# Patient Record
Sex: Female | Born: 1950 | Race: White | Hispanic: No | Marital: Married | Smoking: Former smoker
Health system: Southern US, Community
[De-identification: ages and names within clinical notes are randomized; demographics above are authoritative.]

## PROBLEM LIST (undated history)

## (undated) DIAGNOSIS — M25572 Pain in left ankle and joints of left foot: Secondary | ICD-10-CM

## (undated) DIAGNOSIS — H539 Unspecified visual disturbance: Secondary | ICD-10-CM

## (undated) DIAGNOSIS — J189 Pneumonia, unspecified organism: Secondary | ICD-10-CM

## (undated) DIAGNOSIS — R0600 Dyspnea, unspecified: Secondary | ICD-10-CM

## (undated) DIAGNOSIS — D649 Anemia, unspecified: Secondary | ICD-10-CM

## (undated) DIAGNOSIS — J849 Interstitial pulmonary disease, unspecified: Secondary | ICD-10-CM

## (undated) DIAGNOSIS — E782 Mixed hyperlipidemia: Secondary | ICD-10-CM

## (undated) DIAGNOSIS — E559 Vitamin D deficiency, unspecified: Secondary | ICD-10-CM

## (undated) DIAGNOSIS — I10 Essential (primary) hypertension: Secondary | ICD-10-CM

## (undated) DIAGNOSIS — M25571 Pain in right ankle and joints of right foot: Secondary | ICD-10-CM

## (undated) DIAGNOSIS — M5030 Other cervical disc degeneration, unspecified cervical region: Secondary | ICD-10-CM

## (undated) DIAGNOSIS — I639 Cerebral infarction, unspecified: Secondary | ICD-10-CM

## (undated) DIAGNOSIS — F329 Major depressive disorder, single episode, unspecified: Secondary | ICD-10-CM

## (undated) DIAGNOSIS — M130 Polyarthritis, unspecified: Secondary | ICD-10-CM

## (undated) DIAGNOSIS — F32A Depression, unspecified: Secondary | ICD-10-CM

## (undated) HISTORY — DX: Pneumonia, unspecified organism: J18.9

## (undated) HISTORY — PX: CARPAL TUNNEL RELEASE: SHX101

## (undated) HISTORY — DX: Pain in left ankle and joints of left foot: M25.572

## (undated) HISTORY — DX: Unspecified visual disturbance: H53.9

## (undated) HISTORY — DX: Pain in left ankle and joints of left foot: M25.571

## (undated) HISTORY — DX: Other cervical disc degeneration, unspecified cervical region: M50.30

## (undated) HISTORY — PX: TAYLOR BUNIONECTOMY: SHX2485

## (undated) HISTORY — DX: Polyarthritis, unspecified: M13.0

## (undated) HISTORY — PX: OTHER SURGICAL HISTORY: SHX169

## (undated) HISTORY — DX: Essential (primary) hypertension: I10

## (undated) HISTORY — DX: Vitamin D deficiency, unspecified: E55.9

## (undated) HISTORY — DX: Mixed hyperlipidemia: E78.2

## (undated) HISTORY — DX: Major depressive disorder, single episode, unspecified: F32.9

## (undated) HISTORY — PX: COLONOSCOPY: SHX174

---

## 2004-07-21 ENCOUNTER — Encounter: Admission: RE | Admit: 2004-07-21 | Discharge: 2004-07-21 | Payer: Self-pay | Admitting: Family Medicine

## 2005-08-26 ENCOUNTER — Encounter: Admission: RE | Admit: 2005-08-26 | Discharge: 2005-08-26 | Payer: Self-pay | Admitting: Family Medicine

## 2007-07-23 ENCOUNTER — Emergency Department (HOSPITAL_COMMUNITY): Admission: EM | Admit: 2007-07-23 | Discharge: 2007-07-23 | Payer: Self-pay | Admitting: Emergency Medicine

## 2008-04-11 ENCOUNTER — Encounter: Admission: RE | Admit: 2008-04-11 | Discharge: 2008-04-11 | Payer: Self-pay | Admitting: Family Medicine

## 2009-02-27 ENCOUNTER — Emergency Department (HOSPITAL_COMMUNITY): Admission: EM | Admit: 2009-02-27 | Discharge: 2009-02-27 | Payer: Self-pay | Admitting: Emergency Medicine

## 2009-07-30 ENCOUNTER — Encounter: Admission: RE | Admit: 2009-07-30 | Discharge: 2009-07-30 | Payer: Self-pay | Admitting: Family Medicine

## 2011-01-04 LAB — HEMOCCULT GUIAC POC 1CARD (OFFICE): Fecal Occult Bld: POSITIVE

## 2011-07-07 LAB — BASIC METABOLIC PANEL
BUN: 13
CO2: 21
Calcium: 9.8
Chloride: 104
Creatinine, Ser: 0.86
GFR calc Af Amer: >60
GFR calc non Af Amer: >60
Glucose, Bld: 126 — ABNORMAL HIGH
Potassium: 3.6
Sodium: 136

## 2011-07-07 LAB — CBC
HCT: 43.1
Hemoglobin: 14.7
MCHC: 34.2
MCV: 81.6
Platelets: 382
RBC: 5.28 — ABNORMAL HIGH
RDW: 14.2 — ABNORMAL HIGH
WBC: 10.5

## 2011-07-07 LAB — DIFFERENTIAL
Basophils Absolute: 0
Basophils Relative: 0
Eosinophils Absolute: 0.1
Eosinophils Relative: 1
Lymphocytes Relative: 21
Lymphs Abs: 2.2
Monocytes Absolute: 0.8 — ABNORMAL HIGH
Monocytes Relative: 8
Neutro Abs: 7.3
Neutrophils Relative %: 69

## 2014-01-15 ENCOUNTER — Other Ambulatory Visit: Payer: Self-pay | Admitting: Nurse Practitioner

## 2014-01-15 DIAGNOSIS — Z1231 Encounter for screening mammogram for malignant neoplasm of breast: Secondary | ICD-10-CM

## 2014-01-16 ENCOUNTER — Other Ambulatory Visit: Payer: Self-pay | Admitting: Nurse Practitioner

## 2014-01-16 DIAGNOSIS — Z780 Asymptomatic menopausal state: Secondary | ICD-10-CM

## 2014-02-13 ENCOUNTER — Encounter (INDEPENDENT_AMBULATORY_CARE_PROVIDER_SITE_OTHER): Payer: Self-pay

## 2014-02-13 ENCOUNTER — Ambulatory Visit
Admission: RE | Admit: 2014-02-13 | Discharge: 2014-02-13 | Disposition: A | Discharge status: Home / Self Care | Payer: BC Managed Care – PPO | Source: Ambulatory Visit | Attending: Nurse Practitioner | Admitting: Nurse Practitioner

## 2014-02-13 DIAGNOSIS — Z780 Asymptomatic menopausal state: Secondary | ICD-10-CM

## 2014-02-13 DIAGNOSIS — Z1231 Encounter for screening mammogram for malignant neoplasm of breast: Secondary | ICD-10-CM

## 2014-03-15 ENCOUNTER — Emergency Department (HOSPITAL_COMMUNITY)
Admission: EM | Admit: 2014-03-15 | Discharge: 2014-03-15 | Disposition: A | Discharge status: Home / Self Care | Payer: BC Managed Care – PPO | Attending: Emergency Medicine | Admitting: Emergency Medicine

## 2014-03-15 ENCOUNTER — Emergency Department (HOSPITAL_COMMUNITY): Payer: BC Managed Care – PPO

## 2014-03-15 ENCOUNTER — Encounter (HOSPITAL_COMMUNITY): Payer: Self-pay | Admitting: Emergency Medicine

## 2014-03-15 DIAGNOSIS — J3489 Other specified disorders of nose and nasal sinuses: Secondary | ICD-10-CM | POA: Insufficient documentation

## 2014-03-15 DIAGNOSIS — J40 Bronchitis, not specified as acute or chronic: Secondary | ICD-10-CM

## 2014-03-15 DIAGNOSIS — Z87891 Personal history of nicotine dependence: Secondary | ICD-10-CM | POA: Insufficient documentation

## 2014-03-15 DIAGNOSIS — Z8659 Personal history of other mental and behavioral disorders: Secondary | ICD-10-CM | POA: Insufficient documentation

## 2014-03-15 DIAGNOSIS — R0602 Shortness of breath: Secondary | ICD-10-CM

## 2014-03-15 HISTORY — DX: Major depressive disorder, single episode, unspecified: F32.9

## 2014-03-15 HISTORY — DX: Depression, unspecified: F32.A

## 2014-03-15 LAB — CBC WITH DIFFERENTIAL/PLATELET
Basophils Absolute: 0 10*3/uL (ref 0.0–0.1)
Basophils Relative: 0 % (ref 0–1)
Eosinophils Absolute: 0.1 10*3/uL (ref 0.0–0.7)
Eosinophils Relative: 1 % (ref 0–5)
HCT: 41 % (ref 36.0–46.0)
Hemoglobin: 14.2 g/dL (ref 12.0–15.0)
Lymphocytes Relative: 21 % (ref 12–46)
Lymphs Abs: 2.3 10*3/uL (ref 0.7–4.0)
MCH: 27.3 pg (ref 26.0–34.0)
MCHC: 34.6 g/dL (ref 30.0–36.0)
MCV: 78.7 fL (ref 78.0–100.0)
Monocytes Absolute: 0.6 10*3/uL (ref 0.1–1.0)
Monocytes Relative: 6 % (ref 3–12)
Neutro Abs: 8.1 10*3/uL — ABNORMAL HIGH (ref 1.7–7.7)
Neutrophils Relative %: 72 % (ref 43–77)
Platelets: 425 10*3/uL — ABNORMAL HIGH (ref 150–400)
RBC: 5.21 MIL/uL — ABNORMAL HIGH (ref 3.87–5.11)
RDW: 14.6 % (ref 11.5–15.5)
WBC: 11.2 10*3/uL — ABNORMAL HIGH (ref 4.0–10.5)

## 2014-03-15 LAB — COMPREHENSIVE METABOLIC PANEL
ALT: 23 U/L (ref 0–35)
AST: 36 U/L (ref 0–37)
Albumin: 4.1 g/dL (ref 3.5–5.2)
Alkaline Phosphatase: 95 U/L (ref 39–117)
BUN: 14 mg/dL (ref 6–23)
CO2: 16 mEq/L — ABNORMAL LOW (ref 19–32)
Calcium: 10 mg/dL (ref 8.4–10.5)
Chloride: 94 mEq/L — ABNORMAL LOW (ref 96–112)
Creatinine, Ser: 0.85 mg/dL (ref 0.50–1.10)
GFR calc Af Amer: 83 mL/min — ABNORMAL LOW (ref >90)
GFR calc non Af Amer: 72 mL/min — ABNORMAL LOW (ref >90)
Glucose, Bld: 139 mg/dL — ABNORMAL HIGH (ref 70–99)
Potassium: 3.8 mEq/L (ref 3.7–5.3)
Sodium: 134 mEq/L — ABNORMAL LOW (ref 137–147)
Total Bilirubin: 0.4 mg/dL (ref 0.3–1.2)
Total Protein: 8 g/dL (ref 6.0–8.3)

## 2014-03-15 LAB — PRO B NATRIURETIC PEPTIDE: Pro B Natriuretic peptide (BNP): 124.4 pg/mL (ref 0–125)

## 2014-03-15 LAB — I-STAT TROPONIN, ED: Troponin i, poc: 0 ng/mL (ref 0.00–0.08)

## 2014-03-15 MED ORDER — PREDNISONE 20 MG PO TABS
60.0000 mg | ORAL_TABLET | Freq: Once | ORAL | Status: AC
  Administered 2014-03-15: 60 mg via ORAL
  Filled 2014-03-15: qty 3

## 2014-03-15 MED ORDER — IPRATROPIUM BROMIDE 0.02 % IN SOLN
0.5000 mg | Freq: Once | RESPIRATORY_TRACT | Status: AC
  Administered 2014-03-15: 0.5 mg via RESPIRATORY_TRACT
  Filled 2014-03-15: qty 2.5

## 2014-03-15 MED ORDER — ALBUTEROL SULFATE (2.5 MG/3ML) 0.083% IN NEBU
5.0000 mg | INHALATION_SOLUTION | Freq: Once | RESPIRATORY_TRACT | Status: AC
  Administered 2014-03-15: 5 mg via RESPIRATORY_TRACT
  Filled 2014-03-15: qty 6

## 2014-03-15 MED ORDER — PROMETHAZINE-CODEINE 6.25-10 MG/5ML PO SYRP
5.0000 mL | ORAL_SOLUTION | Freq: Four times a day (QID) | ORAL | Status: DC | PRN
  Administered 2014-03-15: 5 mL via ORAL

## 2014-03-15 NOTE — ED Notes (Signed)
Pt's O2 94% while ambulating

## 2014-03-15 NOTE — Discharge Instructions (Signed)
Bronchitis Bronchitis is inflammation of the airways that extend from the windpipe into the lungs (bronchi). The inflammation often causes mucus to develop, which leads to a cough. If the inflammation becomes severe, it may cause shortness of breath. CAUSES  Bronchitis may be caused by:   Viral infections.   Bacteria.   Cigarette smoke.   Allergens, pollutants, and other irritants.  SIGNS AND SYMPTOMS  The most common symptom of bronchitis is a frequent cough that produces mucus. Other symptoms include:  Fever.   Body aches.   Chest congestion.   Chills.   Shortness of breath.   Sore throat.  DIAGNOSIS  Bronchitis is usually diagnosed through a medical history and physical exam. Tests, such as chest X-rays, are sometimes done to rule out other conditions.  TREATMENT  You may need to avoid contact with whatever caused the problem (smoking, for example). Medicines are sometimes needed. These may include:  Antibiotics. These may be prescribed if the condition is caused by bacteria.  Cough suppressants. These may be prescribed for relief of cough symptoms.   Inhaled medicines. These may be prescribed to help open your airways and make it easier for you to breathe.   Steroid medicines. These may be prescribed for those with recurrent (chronic) bronchitis. HOME CARE INSTRUCTIONS  Get plenty of rest.   Drink enough fluids to keep your urine clear or pale yellow (unless you have a medical condition that requires fluid restriction). Increasing fluids may help thin your secretions and will prevent dehydration.   Only take over-the-counter or prescription medicines as directed by your health care provider.  Only take antibiotics as directed. Make sure you finish them even if you start to feel better.  Avoid secondhand smoke, irritating chemicals, and strong fumes. These will make bronchitis worse. If you are a smoker, quit smoking. Consider using nicotine gum or  skin patches to help control withdrawal symptoms. Quitting smoking will help your lungs heal faster.   Put a cool-mist humidifier in your bedroom at night to moisten the air. This may help loosen mucus. Change the water in the humidifier daily. You can also run the hot water in your shower and sit in the bathroom with the door closed for 5-10 minutes.   Follow up with your health care provider as directed.   Wash your hands frequently to avoid catching bronchitis again or spreading an infection to others.  SEEK MEDICAL CARE IF: Your symptoms do not improve after 1 week of treatment.  SEEK IMMEDIATE MEDICAL CARE IF:  Your fever increases.  You have chills.   You have chest pain.   You have worsening shortness of breath.   You have bloody sputum.  You faint.  You have lightheadedness.  You have a severe headache.   You vomit repeatedly. MAKE SURE YOU:   Understand these instructions.  Will watch your condition.  Will get help right away if you are not doing well or get worse. Document Released: 09/13/2005 Document Revised: 07/04/2013 Document Reviewed: 05/08/2013 [facility] Patient Information 2015 [place], [place]. This information is not intended to replace advice given to you by your health care provider. Make sure you discuss any questions you have with your health care provider.  Shortness of Breath Shortness of breath means you have trouble breathing. Shortness of breath needs medical care right away. HOME CARE   Do not smoke.  Avoid being around chemicals or things (paint fumes, dust) that may bother your breathing.  Rest as needed. Slowly begin your  normal activities.  Only take medicines as told by your doctor.  Keep all doctor visits as told. GET HELP RIGHT AWAY IF:   Your shortness of breath gets worse.  You feel lightheaded, pass out (faint), or have a cough that is not helped by medicine.  You cough up blood.  You have pain with  breathing.  You have pain in your chest, arms, shoulders, or belly (abdomen).  You have a fever.  You cannot walk up stairs or exercise the way you normally do.  You do not get better in the time expected.  You have a hard time doing normal activities even with rest.  You have problems with your medicines.  You have any new symptoms. MAKE SURE YOU:  Understand these instructions.  Will watch your condition.  Will get help right away if you are not doing well or get worse. Document Released: 03/01/2008 Document Revised: 09/18/2013 Document Reviewed: 11/29/2011 [facility] Patient Information 2015 [place], [place]. This information is not intended to replace advice given to you by your health care provider. Make sure you discuss any questions you have with your health care provider.

## 2014-03-15 NOTE — ED Notes (Signed)
Pt was told by PCP yesterday to come to ED if she couldn't breath. Pt c/o SOB since this morning, PCP diagnosed pt with bronchitis, states she may have mentioned pneum ina. Pt oxygen saturation at 100%

## 2014-03-15 NOTE — ED Provider Notes (Signed)
CSN: [id number]     Arrival date & time 03/15/14  6415 History   First MD Initiated Contact with Patient 03/15/14 [phone]     Chief Complaint  Patient presents with  . Shortness of Breath     (Consider location/radiation/quality/duration/timing/severity/associated sxs/prior Treatment) HPI Comments: Patient is a 63 year old female with history of depression who presents today with shortness of breath. She reports that since Saturday she has been having gradually worsening shortness of breath and cough. She had congestion and rhinorrhea. She was seen by her primary care physician yesterday and given Cipro, prednisone, albuterol. She received a breathing treatment in the office yesterday which she reports improved her symptoms. She has been unable to tolerate her medications due to vomiting. She reports that she had bilateral leg pain last night. It was achy in nature. No unilateral leg swelling. No long trips or recent surgeries. No prior history of DVT or PE. Initially the patient denies smoking, but her son reports that she does in fact smoked marijuana through a pipe daily.  The history is provided by the patient. No language interpreter was used.    Past Medical History  Diagnosis Date  . Depression    Past Surgical History  Procedure Laterality Date  . Cesarean section     No family history on file. History  Substance Use Topics  . Smoking status: Former Research scientist (life sciences)  . Smokeless tobacco: Not on file  . Alcohol Use: Yes   OB History   Grav Para Term Preterm Abortions TAB SAB Ect Mult Living                 Review of Systems  Constitutional: Negative for fever, chills and diaphoresis.  HENT: Positive for congestion and rhinorrhea.   Respiratory: Positive for cough and shortness of breath.   Cardiovascular: Negative for chest pain, palpitations and leg swelling.  Gastrointestinal: Positive for nausea and vomiting. Negative for abdominal pain.  All other systems reviewed and are  negative.     Allergies  Review of patient's allergies indicates no known allergies.  Home Medications   Prior to Admission medications   Not on File   BP 153/83  Pulse 86  Temp(Src) 98.3 F (36.8 C) (Oral)  Resp 15  SpO2 94% Physical Exam  Nursing note and vitals reviewed. Constitutional: She is oriented to person, place, and time. She appears well-developed and well-nourished. No distress.  HENT:  Head: Normocephalic and atraumatic.  Right Ear: External ear normal.  Left Ear: External ear normal.  Nose: Nose normal.  Mouth/Throat: Oropharynx is clear and moist.  Eyes: Conjunctivae are normal.  Neck: Normal range of motion.  No nuchal rigidity or meningeal signs  Cardiovascular: Regular rhythm, normal heart sounds, intact distal pulses and normal pulses.  Tachycardia present.   Pulses:      Radial pulses are 2+ on the right side, and 2+ on the left side.       Posterior tibial pulses are 2+ on the right side, and 2+ on the left side.  No leg swelling or tenderness  Pulmonary/Chest: No stridor. Tachypnea noted. No respiratory distress. She has wheezes. She has rales.  Abdominal: Soft. She exhibits no distension. There is no tenderness.  Musculoskeletal: Normal range of motion.  Neurological: She is alert and oriented to person, place, and time. She has normal strength.  Skin: Skin is warm and dry. She is not diaphoretic. No erythema.  Psychiatric: She has a normal mood and affect. Her behavior is normal.  ED Course  Procedures (including critical care time) Labs Review Labs Reviewed  CBC WITH DIFFERENTIAL - Abnormal; Notable for the following:    WBC 11.2 (*)    RBC 5.21 (*)    Platelets 425 (*)    Neutro Abs 8.1 (*)    All other components within normal limits  COMPREHENSIVE METABOLIC PANEL - Abnormal; Notable for the following:    Sodium 134 (*)    Chloride 94 (*)    CO2 16 (*)    Glucose, Bld 139 (*)    GFR calc non Af Amer 72 (*)    GFR calc Af Amer  83 (*)    All other components within normal limits  PRO B NATRIURETIC PEPTIDE  I-STAT TROPOININ, ED    Imaging Review Dg Chest 2 View  03/15/2014   CLINICAL DATA:  Shortness of breath.  Former tobacco use.  EXAM: CHEST  2 VIEW  COMPARISON:  None.  FINDINGS: Airway thickening is present, suggesting bronchitis or reactive airways disease. Mild interstitial accentuation favoring the upper lungs. Atherosclerotic aortic arch. No cardiomegaly. No pleural effusion.  IMPRESSION: 1. Airway thickening is present, suggesting bronchitis or reactive airways disease. 2. Subtle upper lung zone vascularity scratch add subtle interstitial accentuation in the lungs, favoring the upper lobes. Walk quite likely related to the patient's prior smoking, various connective tissue disorders, pneumoconioses, and infectious processes can cause a similar appearance. 3. Atherosclerosis.   Electronically Signed   By: Sherryl Barters M.D.   On: 03/15/2014 11:11     EKG Interpretation None      MDM   Final diagnoses:  Bronchitis  Shortness of breath   Patient presents to ED with shortness of breath which has been worsening since Saturday. Sx began with rhinorrhea and congestion. She was seen by PCP yesterday and given abx, prednisone, and albuterol. She reports being unable to tolerate abx and steroids because of vomiting. She received phenergan in ED and has been able to tolerate PO here. Will discharge home with antiemetic. Patient feels improved after albuterol treatment here. Lung exam improved. Patient ambulated in ED with oxygen saturation > 94% on room air. Discussed strict reasons to return to ED immediately. Vital signs stable for discharge. Discussed case with Dr. Wilson Singer who agrees with plan. Patient / Family / Caregiver informed of clinical course, understand medical decision-making process, and agree with plan.     Elwyn Lade, PA-C 03/15/14 1310

## 2014-03-21 NOTE — ED Provider Notes (Signed)
Medical screening examination/treatment/procedure(s) were conducted as a shared visit with non-physician practitioner(s) and myself.  I personally evaluated the patient during the encounter.   EKG Interpretation   Date/Time:  Friday March 15 2014 09:53:32 EDT Ventricular Rate:  103 PR Interval:  110 QRS Duration: 84 QT Interval:  330 QTC Calculation: 432 R Axis:   74 Text Interpretation:  Sinus tachycardia Borderline repolarization  abnormality ED PHYSICIAN INTERPRETATION AVAILABLE IN [facility]  Confirmed by TEST, Record ([id number]) on 03/17/2014 10:17:63 AM     63 year old female with shortness of breath. Gradual onset of symptoms on Saturday and progressively worsening. She feels congested. Seen by PCP yesterday started on steroids, ciprofloxacin and albuterol. She is presenting today she's been having vomiting and keep her medications down. Clinically suspect the patient has bronchitis. Associated nausea and vomiting typically more associated with bacterial pneumonia though. Regardless, I feel she is stable for discharge. Continue steroids and antibiotics. Symptoms improved with albuterol here in the emergency room. She ambulated in ED and oxygen saturations remained good. We'll additionally start her on antiemetic. Return precautions discussed.   Virgel Manifold, MD 03/21/14 1041

## 2014-09-27 DIAGNOSIS — J189 Pneumonia, unspecified organism: Secondary | ICD-10-CM

## 2014-09-27 HISTORY — DX: Pneumonia, unspecified organism: J18.9

## 2016-01-08 ENCOUNTER — Other Ambulatory Visit: Payer: Self-pay | Admitting: Nurse Practitioner

## 2016-01-08 DIAGNOSIS — Z1231 Encounter for screening mammogram for malignant neoplasm of breast: Secondary | ICD-10-CM

## 2016-01-08 DIAGNOSIS — E2839 Other primary ovarian failure: Secondary | ICD-10-CM

## 2016-02-02 ENCOUNTER — Other Ambulatory Visit: Payer: Self-pay

## 2016-02-03 ENCOUNTER — Ambulatory Visit (INDEPENDENT_AMBULATORY_CARE_PROVIDER_SITE_OTHER): Payer: BLUE CROSS/BLUE SHIELD | Admitting: Internal Medicine

## 2016-02-03 ENCOUNTER — Other Ambulatory Visit (INDEPENDENT_AMBULATORY_CARE_PROVIDER_SITE_OTHER): Payer: BLUE CROSS/BLUE SHIELD

## 2016-02-03 ENCOUNTER — Encounter: Payer: Self-pay | Admitting: Internal Medicine

## 2016-02-03 VITALS — BP 154/82 | HR 66 | Ht 62.0 in | Wt 158.6 lb

## 2016-02-03 DIAGNOSIS — J45991 Cough variant asthma: Secondary | ICD-10-CM | POA: Diagnosis not present

## 2016-02-03 DIAGNOSIS — R918 Other nonspecific abnormal finding of lung field: Secondary | ICD-10-CM | POA: Diagnosis not present

## 2016-02-03 LAB — CBC WITH DIFFERENTIAL/PLATELET
BASOS ABS: 0 10*3/uL (ref 0.0–0.1)
Basophils Relative: 0.2 % (ref 0.0–3.0)
EOS PCT: 2.1 % (ref 0.0–5.0)
Eosinophils Absolute: 0.2 10*3/uL (ref 0.0–0.7)
HCT: 41.5 % (ref 36.0–46.0)
Hemoglobin: 13.7 g/dL (ref 12.0–15.0)
Lymphocytes Relative: 29.4 % (ref 12.0–46.0)
Lymphs Abs: 3.4 10*3/uL (ref 0.7–4.0)
MCHC: 33 g/dL (ref 30.0–36.0)
MCV: 81.7 fl (ref 78.0–100.0)
MONOS PCT: 5.2 % (ref 3.0–12.0)
Monocytes Absolute: 0.6 10*3/uL (ref 0.1–1.0)
Neutro Abs: 7.3 10*3/uL (ref 1.4–7.7)
Neutrophils Relative %: 63.1 % (ref 43.0–77.0)
Platelets: 398 10*3/uL (ref 150.0–400.0)
RBC: 5.08 Mil/uL (ref 3.87–5.11)
RDW: 15.4 % (ref 11.5–15.5)
WBC: 11.6 10*3/uL — ABNORMAL HIGH (ref 4.0–10.5)

## 2016-02-03 LAB — SEDIMENTATION RATE: Sed Rate: 18 mm/hr (ref 0–22)

## 2016-02-03 MED ORDER — BUDESONIDE-FORMOTEROL FUMARATE 80-4.5 MCG/ACT IN AERO: INHALATION_SPRAY | RESPIRATORY_TRACT | Status: DC

## 2016-02-03 NOTE — Patient Instructions (Addendum)
Plan A = Automatic =  symbicort 80 Take 2 puffs first thing in am and then another 2 puffs about 12 hours later.   Work on inhaler technique:  relax and gently blow all the way out then take a nice smooth deep breath back in, triggering the inhaler at same time you start breathing in.  Hold for up to 5 seconds if you can. Blow out thru nose. Rinse and gargle with water when done       Plan B = Backup Only use your albuterol (ventolin) as a rescue medication to be used if you can't catch your breath by resting or doing a relaxed purse lip breathing pattern.  - The less you use it, the better it will work when you need it. - Ok to use the inhaler up to 2 puffs  every 4 hours if you must but call for appointment if use goes up over your usual need - Don't leave home without it !!  (think of it like the spare tire for your car)   Please remember to go to the lab department downstairs for your tests - we will call you with the results when they are available.  Need the teeth taken care of and if not better next visit we will do a sinus CT   Please schedule a follow up office visit in 6 weeks, call sooner if needed with cxr on return

## 2016-02-03 NOTE — Progress Notes (Addendum)
Subjective:     Patient ID: Sharon [name], female   DOB: Aug 28, 1951,   MRN: [id number]  HPI  89 yowf quit smoking in 1980 with first pregnancy and no resp problems except for seasonal rhinitis starting 2012 but hen  around 2015 with recurrent cough > sob 3-4 x times per year rx by Dr Fayrene Fearing PA Nonda Lou with neb/ abx/ prednisone and prn symbicort 160 (was instructed to use as maint per office notes but not doing so)  referred to pulmonary clinic 02/03/2016 by Dr Melford Aase for cough assoc with finding of MPN's on ct chest 01/13/16     02/03/2016 1st [facility] office visit/ Leelynd Maldonado   Chief Complaint  Patient presents with  . Pulmonary Consult    Ref. by Dr. Donaciano Eva. CT,recurrent bronchitis.Occass. cough in am dry,no sob,wheezing occass.Denies cp or tightness.  still coughing in am "it's the pollen" x 5 years not previously allergy tested but does seem worse in spring but does have symptoms of sense of pnds/cough beginning to blend into a year round pattern x last sev years.  Also worse in am's but never wakes her up/ does not always correlate with obvious other rhinitis symtpoms  No obvious other patterns in day to day or daytime variability or assoc excess/ purulent sputum or mucus plugs or hemoptysis or cp or chest tightness, subjective wheeze or overt   hb symptoms. No unusual exp hx or h/o childhood pna/ asthma or knowledge of premature birth.  Sleeping ok without nocturnal  or early am exacerbation  of respiratory  c/o's or need for noct saba. Also denies any obvious fluctuation of symptoms with weather or environmental changes or other aggravating or alleviating factors except as outlined above   Current Medications, Allergies, Complete Past Medical History, Past Surgical History, Family History, and Social History were reviewed in [employer] record.  ROS  The following are not active complaints unless bolded sore throat, dysphagia, dental problems,  itching, sneezing,  nasal congestion or excess/ purulent secretions, ear ache,   fever, chills, sweats, unintended wt loss, classically pleuritic or exertional cp,  orthopnea pnd or leg swelling, presyncope, palpitations, abdominal pain, anorexia, nausea, vomiting, diarrhea  or change in bowel or bladder habits, change in stools or urine, dysuria,hematuria,  rash, arthralgias, visual complaints, headache, numbness, weakness or ataxia or problems with walking or coordination,  change in mood/affect or memory.          Review of Systems     Objective:   Physical Exam    amb wf minimal dry cough   Wt Readings from Last 3 Encounters:  02/03/16 158 lb 9.6 oz (71.94 kg)    Vital signs reviewed   HEENT: nl  oropharynx. Nl external ear canals without cough reflex - moderate bilateral non-specific turbinate edema  And very poor dentition   NECK :  without JVD/Nodes/TM/ nl carotid upstrokes bilaterally   LUNGS: no acc muscle use,  Nl contour chest which is clear to A and P bilaterally with   cough on  exp maneuvers   CV:  RRR  no s3 or murmur or increase in P2, no edema   ABD:  soft and nontender with nl inspiratory excursion in the supine position. No bruits or organomegaly, bowel sounds nl  MS:  Nl gait/ ext warm without deformities, calf tenderness, cyanosis or clubbing No obvious joint restrictions   SKIN: warm and dry without lesions    NEURO:  alert, approp, nl sensorium with  no motor deficits    Labs ordered 02/03/2016 / cbc with diff / allergy profile      Assessment:

## 2016-02-04 ENCOUNTER — Other Ambulatory Visit: Payer: Self-pay

## 2016-02-04 ENCOUNTER — Ambulatory Visit: Payer: Self-pay

## 2016-02-04 LAB — RESPIRATORY ALLERGY PROFILE REGION II ~~LOC~~
ALLERGEN, COMM SILVER BIRCH, T3: 0.21 kU/L — AB
ALLERGEN, COTTONWOOD, T14: 0.14 kU/L — AB
ALLERGEN, OAK, T7: 0.36 kU/L — AB
Allergen, D pternoyssinus,d7: 4.87 kU/L — ABNORMAL HIGH
Allergen, Mulberry, t76: <0.1 kU/L
Alternaria Alternata: <0.1 kU/L
Aspergillus fumigatus, m3: <0.1 kU/L
BERMUDA GRASS: 0.4 kU/L — AB
Box Elder IgE: 2.08 kU/L — ABNORMAL HIGH
Cladosporium Herbarum: <0.1 kU/L
Cockroach: 0.13 kU/L — ABNORMAL HIGH
Common Ragweed: 0.22 kU/L — ABNORMAL HIGH
D. farinae: 5.71 kU/L — ABNORMAL HIGH
Dog Dander: 0.36 kU/L — ABNORMAL HIGH
ELM IGE: 0.13 kU/L — AB
IgE (Immunoglobulin E), Serum: 168 kU/L — ABNORMAL HIGH (ref <115)
Johnson Grass: 0.47 kU/L — ABNORMAL HIGH
PECAN/HICKORY TREE IGE: 0.75 kU/L — AB
Penicillium Notatum: <0.1 kU/L
Rough Pigweed  IgE: <0.1 kU/L
Sheep Sorrel IgE: 0.17 kU/L — ABNORMAL HIGH
Timothy Grass: 1.57 kU/L — ABNORMAL HIGH

## 2016-02-04 LAB — RHEUMATOID FACTOR: Rhuematoid fact SerPl-aCnc: <10 IU/mL (ref <=14)

## 2016-02-04 LAB — ANA: Anti Nuclear Antibody(ANA): NEGATIVE

## 2016-02-04 LAB — CYCLIC CITRUL PEPTIDE ANTIBODY, IGG: Cyclic Citrullin Peptide Ab: <16 Units

## 2016-02-04 NOTE — Assessment & Plan Note (Signed)
CT chest 01/13/16  multiple nodules on the right measuring up to 5 mm - collagen vasc/hsp profile 02/03/2016  >>>  I agree with radiology that most likely these are inflammatory and likely cannot be seen on the plain chest x-ray so we really don't have any chronologic basis to say what  they are but in all likelihood they are benign.  Discussed in detail all the  indications, usual  risks and alternatives  relative to the benefits with patient who agrees to proceed with conservative with a 12 month comparison study based on the Fleischner Society guidelines.  In the meantime we'll work her up for occult collagen vascular disease and hypersensitivity pneumonitis, which seem clinically also unlikely.  Discussed in detail all the  indications, usual  risks and alternatives  relative to the benefits with patient who agrees to proceed with conservative f/u as outlined

## 2016-02-04 NOTE — Assessment & Plan Note (Signed)
The most common causes of chronic cough in immunocompetent adults include the following: upper airway cough syndrome (UACS), previously referred to as postnasal drip syndrome (PNDS), which is caused by variety of rhinosinus conditions; (2) asthma; (3) GERD; (4) chronic bronchitis from cigarette smoking or other inhaled environmental irritants; (5) nonasthmatic eosinophilic bronchitis; and (6) bronchiectasis.   These conditions, singly or in combination, have accounted for up to 94% of the causes of chronic cough in prospective studies.   Other conditions have constituted no >6% of the causes in prospective studies These have included bronchogenic carcinoma, chronic interstitial pneumonia, sarcoidosis, left ventricular failure, ACEI-induced cough, and aspiration from a condition associated with pharyngeal dysfunction.    Chronic cough is often simultaneously caused by more than one condition. A single cause has been found from 38 to 82% of the time, multiple causes from 18 to 62%. Multiply caused cough has been the result of three diseases up to 42% of the time.   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 Seasonal   rhinitis with somewhat of an atopic history and recommended she be evaluated for an allergic component with a simple allergy profile today and also make sure that we eliminate possibility of  any sinus problems that could be a complication of her very poor dentition causing postnasal drip syndrome and contributing to cough on this basis.  The best short-term option I believe is a trial of low-dose Symbicort namely the 80 dose 2 puffs every 12 hours and follow-up here to be sure that all of her respiratory symptoms are addressed. I don't think that have anything to do however with her CT chest findings (see separate a/p)

## 2016-02-09 LAB — HYPERSENSITIVITY PNUEMONITIS PROFILE

## 2016-02-09 NOTE — Progress Notes (Signed)
Quick Note:  ATC, NA and no option to leave msg ______ 

## 2016-02-10 NOTE — Progress Notes (Signed)
Quick Note:  Spoke with pt and notified of results per Dr. Wert. Pt verbalized understanding and denied any questions.  ______ 

## 2016-02-20 ENCOUNTER — Encounter: Payer: Self-pay | Admitting: Internal Medicine

## 2016-02-25 ENCOUNTER — Ambulatory Visit
Admission: RE | Admit: 2016-02-25 | Discharge: 2016-02-25 | Disposition: A | Discharge status: Home / Self Care | Payer: BLUE CROSS/BLUE SHIELD | Source: Ambulatory Visit | Attending: Nurse Practitioner | Admitting: Nurse Practitioner

## 2016-02-25 DIAGNOSIS — Z1231 Encounter for screening mammogram for malignant neoplasm of breast: Secondary | ICD-10-CM

## 2016-02-25 DIAGNOSIS — E2839 Other primary ovarian failure: Secondary | ICD-10-CM

## 2016-03-16 ENCOUNTER — Ambulatory Visit (INDEPENDENT_AMBULATORY_CARE_PROVIDER_SITE_OTHER): Payer: BLUE CROSS/BLUE SHIELD | Admitting: Internal Medicine

## 2016-03-16 ENCOUNTER — Encounter: Payer: Self-pay | Admitting: Internal Medicine

## 2016-03-16 ENCOUNTER — Ambulatory Visit (INDEPENDENT_AMBULATORY_CARE_PROVIDER_SITE_OTHER)
Admission: RE | Admit: 2016-03-16 | Discharge: 2016-03-16 | Disposition: A | Discharge status: Home / Self Care | Payer: BLUE CROSS/BLUE SHIELD | Source: Ambulatory Visit | Attending: Internal Medicine | Admitting: Internal Medicine

## 2016-03-16 VITALS — BP 116/74 | HR 86 | Ht 62.0 in | Wt 157.2 lb

## 2016-03-16 DIAGNOSIS — R918 Other nonspecific abnormal finding of lung field: Secondary | ICD-10-CM | POA: Diagnosis not present

## 2016-03-16 DIAGNOSIS — J45991 Cough variant asthma: Secondary | ICD-10-CM

## 2016-03-16 NOTE — Progress Notes (Signed)
Subjective:     Patient ID: Sharon [name], female   DOB: 1951/09/05,   MRN: [id number]    Brief patient profile:  42 yowf quit smoking in 1980 with first pregnancy and no resp problems except for seasonal rhinitis starting 2012 but hen  around 2015 with recurrent cough > sob 3-4 x times per year rx by Dr Sharon Fearing PA Sharon [name] with neb/ abx/ prednisone and prn symbicort 160 (was instructed to use as maint per office notes but not doing so)  referred to pulmonary clinic 02/03/2016 by Dr Sharon [name] for cough assoc with finding of MPN's on ct chest 01/13/16     History of Present Illness  02/03/2016 [facility]st [facility] office visit/ Sharon [name]   Chief Complaint  Patient presents with  . Pulmonary Consult    Ref. by Dr. Donaciano [name]. CT,recurrent bronchitis.Occass. cough in am dry,no sob,wheezing occass.Denies cp or tightness.  still coughing in am "it's the pollen" x 5 years not previously allergy tested but does seem worse in spring but does have symptoms of sense of pnds/cough beginning to blend into a year round pattern x last sev years.  Also worse in am's but never wakes her up/ does not always correlate with obvious other rhinitis symptoms rec Plan A = Automatic =  symbicort 80 Take 2 puffs first thing in am and then another 2 puffs about 12 hours later.  Work on inhaler technique:     Plan B = Backup Only use your albuterol (ventolin) as a rescue medication Please remember to go to the lab department downstairs for your tests - we will call you with the results when they are available>  Ige E 168 Pos dust/trees/grass  Need the teeth taken care of and if not better next visit we will do a sinus CT     03/16/2016  f/u ov/Sharon [name] re: cough variant asthma better on symb 80 2bid/ no rescue  Chief Complaint  Patient presents with  . Follow-up    Cough has improved. She states only has minimal am cough that is non prod.    Not limited by breathing from desired activities    No obvious other  patterns in day to day or daytime variability or assoc excess/ purulent sputum or mucus plugs or hemoptysis or cp or chest tightness, subjective wheeze or overt   hb symptoms. No unusual exp hx or h/o childhood pna/ asthma or knowledge of premature birth.  Sleeping ok without nocturnal  or early am exacerbation  of respiratory  c/o's or need for noct saba. Also denies any obvious fluctuation of symptoms with weather or environmental changes or other aggravating or alleviating factors except as outlined above   Current Medications, Allergies, Complete Past Medical History, Past Surgical History, Family History, and Social History were reviewed in [employer] record.  ROS  The following are not active complaints unless bolded sore throat, dysphagia, dental problems, itching, sneezing,  nasal congestion or excess/ purulent secretions, ear ache,   fever, chills, sweats, unintended wt loss, classically pleuritic or exertional cp,  orthopnea pnd or leg swelling, presyncope, palpitations, abdominal pain, anorexia, nausea, vomiting, diarrhea  or change in bowel or bladder habits, change in stools or urine, dysuria,hematuria,  rash, arthralgias, visual complaints, headache, numbness, weakness or ataxia or problems with walking or coordination,  change in mood/affect or memory.                Objective:   Physical Exam   amb wf nad  Wt Readings from Last 3 Encounters:  03/16/16 157 lb 3.2 oz (71.305 kg)  02/03/16 158 lb 9.6 oz (71.94 kg)    Vital signs reviewed   HEENT: nl  oropharynx. Nl external ear canals without cough reflex - moderate bilateral non-specific turbinate edema  And very poor dentition   NECK :  without JVD/Nodes/TM/ nl carotid upstrokes bilaterally   LUNGS: no acc muscle use,  Nl contour chest which is clear to A and P bilaterally    CV:  RRR  no s3 or murmur or increase in P2, no edema   ABD:  soft and nontender with nl inspiratory excursion in  the supine position. No bruits or organomegaly, bowel sounds nl  MS:  Nl gait/ ext warm without deformities, calf tenderness, cyanosis or clubbing No obvious joint restrictions   SKIN: warm and dry without lesions    NEURO:  alert, approp, nl sensorium with  no motor deficits    CXR PA and Lateral:   03/16/2016 :    I personally reviewed images and agree with radiology impression as follows:   Chronic bronchitic changes.  Mild diffuse chronic interstitial lung disease, stable.   Assessment:

## 2016-03-16 NOTE — Assessment & Plan Note (Signed)
CT chest 01/13/16  multiple nodules on the right measuring up to 5 mm> tickle file for 02/11/17 - collagen vasc/hsp profile 02/03/2016  > neg   No change on plain cxr/ no symptoms attributable to ILD so f/u as planned for 02/11/17  Discussed in detail all the  indications, usual  risks and alternatives  relative to the benefits with patient who agrees to proceed with conservative f/u as outlined

## 2016-03-16 NOTE — Patient Instructions (Addendum)
Work on inhaler technique:  relax and gently blow all the way out then take a nice smooth deep breath back in, triggering the inhaler at same time you start breathing in.  Hold for up to 5 seconds if you can. Blow out thru nose. Rinse and gargle with water when done     Please remember to go to the  x-ray department downstairs for your tests - we will call you with the results when they are available.  We will call you in a year for the follow up CT but you call us if in meantime if not doing great.

## 2016-03-16 NOTE — Assessment & Plan Note (Signed)
02/03/2016  > try symbicort 80 2bid  - Allergy profile No visit date found. >  Eos 0.2 /  IgE  168 Pos RAST  Dust, trees, grass  - 03/16/2016  After extensive coaching HFA effectiveness =    75%   Despite suboptimal hfa, All goals of chronic asthma control met including optimal function and elimination of symptoms with minimal need for rescue therapy.  Contingencies discussed in full including contacting this office immediately if not controlling the symptoms using the rule of two's.     Could add singulair next if not satisfied with control of nasal symptoms/cough  I had an extended discussion with the patient reviewing all relevant studies completed to date and  lasting 15 to 20 minutes of a 25 minute visit    Each maintenance medication was reviewed in detail including most importantly the difference between maintenance and prns and under what circumstances the prns are to be triggered using an action plan format that is not reflected in the computer generated alphabetically organized AVS.    Please see instructions for details which were reviewed in writing and the patient given a copy highlighting the part that I personally wrote and discussed at today's ov.

## 2016-03-16 NOTE — Progress Notes (Signed)
Quick Note:  Spoke with pt and notified of results per Dr. Wert. Pt verbalized understanding and denied any questions.  ______ 

## 2016-12-09 ENCOUNTER — Other Ambulatory Visit: Payer: Self-pay | Admitting: Internal Medicine

## 2016-12-09 DIAGNOSIS — R918 Other nonspecific abnormal finding of lung field: Secondary | ICD-10-CM

## 2017-02-07 ENCOUNTER — Encounter (INDEPENDENT_AMBULATORY_CARE_PROVIDER_SITE_OTHER): Payer: Self-pay

## 2017-02-07 ENCOUNTER — Ambulatory Visit (INDEPENDENT_AMBULATORY_CARE_PROVIDER_SITE_OTHER)
Admission: RE | Admit: 2017-02-07 | Discharge: 2017-02-07 | Disposition: A | Discharge status: Home / Self Care | Payer: 59 | Source: Ambulatory Visit | Attending: Internal Medicine | Admitting: Internal Medicine

## 2017-02-07 DIAGNOSIS — R918 Other nonspecific abnormal finding of lung field: Secondary | ICD-10-CM | POA: Diagnosis not present

## 2017-02-08 NOTE — Progress Notes (Signed)
Spoke with pt and notified of results per Dr. Wert. Pt verbalized understanding and denied any questions. 

## 2017-08-11 ENCOUNTER — Ambulatory Visit: Payer: 59 | Admitting: Internal Medicine

## 2017-11-01 ENCOUNTER — Ambulatory Visit (INDEPENDENT_AMBULATORY_CARE_PROVIDER_SITE_OTHER): Payer: Medicare Other | Admitting: Internal Medicine

## 2017-11-01 ENCOUNTER — Encounter: Payer: Self-pay | Admitting: Internal Medicine

## 2017-11-01 ENCOUNTER — Other Ambulatory Visit (INDEPENDENT_AMBULATORY_CARE_PROVIDER_SITE_OTHER): Payer: Medicare Other

## 2017-11-01 VITALS — BP 128/64 | HR 80 | Ht 62.0 in | Wt 155.6 lb

## 2017-11-01 DIAGNOSIS — R05 Cough: Secondary | ICD-10-CM

## 2017-11-01 DIAGNOSIS — R0600 Dyspnea, unspecified: Secondary | ICD-10-CM

## 2017-11-01 DIAGNOSIS — R0989 Other specified symptoms and signs involving the circulatory and respiratory systems: Secondary | ICD-10-CM

## 2017-11-01 DIAGNOSIS — Z87891 Personal history of nicotine dependence: Secondary | ICD-10-CM

## 2017-11-01 DIAGNOSIS — R053 Chronic cough: Secondary | ICD-10-CM

## 2017-11-01 DIAGNOSIS — R0609 Other forms of dyspnea: Secondary | ICD-10-CM | POA: Diagnosis not present

## 2017-11-01 DIAGNOSIS — R059 Cough, unspecified: Secondary | ICD-10-CM

## 2017-11-01 DIAGNOSIS — Z8739 Personal history of other diseases of the musculoskeletal system and connective tissue: Secondary | ICD-10-CM | POA: Diagnosis not present

## 2017-11-01 LAB — CBC WITH DIFFERENTIAL/PLATELET
BASOS ABS: 0.2 10*3/uL — AB (ref 0.0–0.1)
Basophils Relative: 1.3 % (ref 0.0–3.0)
EOS ABS: 0.7 10*3/uL (ref 0.0–0.7)
Eosinophils Relative: 5.6 % — ABNORMAL HIGH (ref 0.0–5.0)
HEMATOCRIT: 38.3 % (ref 36.0–46.0)
Hemoglobin: 12.4 g/dL (ref 12.0–15.0)
Lymphocytes Relative: 29.8 % (ref 12.0–46.0)
Lymphs Abs: 3.8 10*3/uL (ref 0.7–4.0)
MCHC: 32.3 g/dL (ref 30.0–36.0)
MCV: 82.3 fl (ref 78.0–100.0)
Monocytes Absolute: 0.9 10*3/uL (ref 0.1–1.0)
Monocytes Relative: 7 % (ref 3.0–12.0)
NEUTROS ABS: 7.2 10*3/uL (ref 1.4–7.7)
NEUTROS PCT: 56.3 % (ref 43.0–77.0)
Platelets: 392 10*3/uL (ref 150.0–400.0)
RBC: 4.65 Mil/uL (ref 3.87–5.11)
RDW: 14.7 % (ref 11.5–15.5)
WBC: 12.8 10*3/uL — AB (ref 4.0–10.5)

## 2017-11-01 LAB — NITRIC OXIDE: Nitric Oxide: 48

## 2017-11-01 LAB — SEDIMENTATION RATE: SED RATE: 30 mm/h (ref 0–30)

## 2017-11-01 MED ORDER — PREDNISONE 10 MG PO TABS: ORAL_TABLET | ORAL | 0 refills | Status: DC

## 2017-11-01 NOTE — Patient Instructions (Addendum)
ICD-10-CM   1. Chronic cough R05   2. Dyspnea on exertion R06.09   3. Chest crackles R09.89   4. Personal history of rheumatoid arthritis Z87.39   5. Stopped smoking with greater than 30 pack year history Z87.891     Please take prednisone 40 mg x1 day, then 30 mg x1 day, then 20 mg x1 day, then 10 mg x1 day, and then 5 mg x1 day and stop  Increase symbicort from 1 puff twice daily to 2 puff twice daily  Do HRCT supine and prone per ILD protocol  Do Serum: ESR, ACE, ANA, DS-DNA, RF, anti-CCP, ssA, ssB, scl-70, ANCA screen, MPO, PR-3, Total CK,  RNP, Aldolase,  Hypersensitivity Pneumonitis Panel  Do Blood CBC with diff and blood IgE level  Do Pre-bd spiro and post BD spiro and dlco only. No lung volume or bd response next few weeks anytime   Followup  - return next few weeks but after completing above; ok to book in ILD clinic

## 2017-11-01 NOTE — Progress Notes (Signed)
Subjective:     Patient ID: Sharon [name], female   DOB: Dec 22, 1950, 67 y.o.   MRN: [id number]  HPI    OV 11/01/2017 - ILD clinic   - transfer of care and 2nd opinion  Chief Complaint  Patient presents with  . Advice Only    Referred by Dr. Melford Aase due to an abnormal CT.  Pt does have complaints of a dry cough and SOB with exertion or if has a coughing episode.    67 year old female referred by the primary care physician to the ILD clinic for evaluation of her pulmonary problems with symptoms of cough and shortness of breath.  According to the patient for a better part of 2 years she has had episodic cough particularly in the fall season.  She says she is active in the outdoors doing gardening and cutting and blowing and mowing and she usually wears a mask but nevertheless she will get a bronchitis episode that will require nebulizers and prednisone to resolve.  Symptoms will usually resolve over 2-3 weeks.  However this time around it is taken 8 weeks to resolve.  Last prednisone was over a week or 2 ago.  She still has some ongoing wheezing.  In the background of this episodic cough she says between episodes she does not have any cough but she does have some baseline shortness of breath when she climbs a flight of stairs this is been stable and is of insidious onset also present for 2 years.  She did have a CT scan of the chest approximately 9 months ago in May 2018 that shows in my personal opinion based upon my personal visualization bilateral subpleural reticulation particularly in the upper lobes and some subpleural reticulation in the left lower lobe.  There is no clear distinct craniocaudal gradient in my opinion this would be indeterminate for UIP without a clear alternate etiology.  Given the presence of ILD findings she has been sent to the ILD clinic.  Review of the chart also shows she is seen.my colleague Dr. Melvyn Novas for asthma symptoms and is been placed on Symbicort which she takes 1 puff  twice daily.  The exam nitric oxide a week after prednisone and while on Symbicort is elevated to near abnormal levels at 48 ppb today  [employer] of chest physicians interstitial lung disease questionnaire -Past medical history: Positive for allergies.  IgE was elevated to 168 approximately a year or 2 ago on chart review.  In addition she gives a history of rheumatoid arthritis diagnosed many many years ago.  Seen by Dr. Keturah Barre at that time.  She is only followed up with nonsteroidal anti-inflammatory drugs.  She has not followed up with Dr. Keturah Barre in many years.  She feels she needs to go back.  However in May 2017 her autoimmune limited profile done here was negative.  -Personal exposure history: She has smoked marijuana in the past.  She smokes cigarettes from age 13 to age 88 a pack a day and quit.  -Family history of lung disease: Sister Sharon [name] who lives in [place] apparently has had a lung biopsy and has been diagnosed with sarcoidosis recently.  She is also a smoker.  Father died of congestive heart failure.  There is no diagnosis of pulmonary fibrosis or hypersensitivity pneumonitis  -Home exposure history: Has not lived in a house that is old in the last 10 years.  There is no humidifier or insomnia or hot tub or Jacuzzi or water damage or mold.  She has 1 dog.  She does not have any birds.  She does not use any feathered pillows.  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 Range 02/03/2016 11:20  Anit Nuclear Antibody(ANA) Latest Ref Range:  NEGATIVE  NEG  Cyclic Citrullin Peptide Ab Latest Units: Units <16  RA Latex Turbid. Latest Ref Range: <=14 IU/mL <10    Walking desaturation test on 11/01/2017 185 feet x 3 laps on ROOM AIR:  did not desaturate. Rest pulse ox was 100%, final pulse ox was 97%. HR response 88/min at rest to 100/min at peak exertion. Patient Sharon [name]  Did not Desaturate < 88% . Sharon [name] yes did  Desaturated </= 3% points. Sharon [name] yes did get tachyardic   FeNO - 48ppb and almost abnormal   has a past medical history of Depression and Pneumonia (2016).   reports that she quit smoking about 38 years ago. Her smoking use included cigarettes. She has a 30.00 pack-year smoking history. she has never used smokeless tobacco.  Past Surgical History:  Procedure Laterality Date  . CARPAL TUNNEL RELEASE    . CESAREAN SECTION    . TAYLOR BUNIONECTOMY      Allergies  Allergen Reactions  . Codeine     GI upset    Immunization History  Administered Date(s) Administered  . Influenza, High Dose Seasonal PF 10/25/2017  . Influenza-Unspecified 09/27/2013  . Pneumococcal Conjugate-13 10/25/2017    Family History  Problem Relation Age of Onset  . Emphysema Father   . Asthma Father   . Congestive Heart Failure Father   . Asthma Sister   . COPD Sister   . Stroke Father      Current Outpatient Medications:  .  albuterol (PROVENTIL HFA;VENTOLIN HFA) 108 (90 BASE) MCG/ACT inhaler, Inhale 2 puffs into the lungs every 4 (four) hours as needed for wheezing or shortness of breath., Disp: , Rfl:  .  ALPRAZolam (XANAX) 0.5 MG tablet, Take 0.25-0.5 mg by mouth daily as needed for anxiety., Disp: , Rfl:  .  atorvastatin (LIPITOR) 10 MG tablet, Take by mouth., Disp: , Rfl:  .  budesonide-formoterol (SYMBICORT) 80-4.5 MCG/ACT inhaler, Take 2 puffs first thing in am and then another 2 puffs about 12 hours later., Disp: 1 Inhaler, Rfl: 12 .  dextromethorphan-guaiFENesin (MUCINEX DM) 30-600 MG per 12 hr  tablet, Take 1 tablet by mouth 2 (two) times daily as needed. , Disp: , Rfl:  .  diclofenac (VOLTAREN) 75 MG EC tablet, Take 1 tablet by mouth 2 (two) times daily., Disp: , Rfl:  .  DULoxetine (CYMBALTA) 30 MG capsule, Take one cap daily at bedtime x 1 week then 2 caps daily at bedtime., Disp: , Rfl:  .  losartan (COZAAR) 50 MG tablet, , Disp: , Rfl:  .  traMADol (ULTRAM) 50 MG tablet, Take 50-100 mg by mouth daily. , Disp: , Rfl:  .  travoprost, benzalkonium, (TRAVATAN) 0.004 % ophthalmic solution, Place 1 drop into both eyes at bedtime., Disp: , Rfl:    Review of Systems  Objective:   Physical Exam  Constitutional: She is oriented to person, place, and time. She appears well-developed and well-nourished. No distress.  HENT:  Head: Normocephalic and atraumatic.  Right Ear: External ear normal.  Left Ear: External ear normal.  Mouth/Throat: Oropharynx is clear and moist. No oropharyngeal exudate.  Eyes: Conjunctivae and EOM are normal. Pupils are equal, round, and reactive to light. Right eye exhibits no discharge. Left eye exhibits no discharge. No scleral icterus.  Neck: Normal range of motion. Neck supple. No JVD present. No tracheal deviation present. No thyromegaly present.  Cardiovascular: Normal rate, regular rhythm, normal heart sounds and intact distal pulses. Exam reveals no gallop and no friction rub.  No murmur heard. Pulmonary/Chest: Effort normal. No respiratory distress. She has wheezes. She has rales. She exhibits no tenderness.  Anterior upper lobe crackles correspnding to area of subpleural ILD on CT. Posterior wheezing  Abdominal: Soft. Bowel sounds are normal. She exhibits no distension and no mass. There is no tenderness. There is no rebound and no guarding.  Musculoskeletal: Normal range of motion. She exhibits no edema or tenderness.  No evidence of RA  Lymphadenopathy:    She has no cervical adenopathy.  Neurological: She is alert and oriented to person,  place, and time. She has normal reflexes. No cranial nerve deficit. She exhibits normal muscle tone. Coordination normal.  Skin: Skin is warm and dry. No rash noted. She is not diaphoretic. No erythema. No pallor.  No evidence of malar rash, oral ulcers, DM, mechanic hands  Psychiatric: She has a normal mood and affect. Her behavior is normal. Judgment and thought content normal.  Vitals reviewed.  Vitals:   11/01/17 1518  BP: 128/64  Pulse: 80  SpO2: 96%  Weight: 155 lb 9.6 oz (70.6 kg)  Height: _0  (1.575 m)    Estimated body mass index is 28.46 kg/m as calculated from the following:   Height as of this encounter: _1  (1.575 m).   Weight as of this encounter: 155 lb 9.6 oz (70.6 kg).     Assessment:       ICD-10-CM   1. Chronic cough R05 CT Chest High Resolution    Sed Rate (ESR)    Angiotensin converting enzyme    Antinuclear Antib (ANA)    Anti-DNA antibody, double-stranded    Rheumatoid Factor    Cyclic citrul peptide antibody, IgG    Sjogren's syndrome antibods(ssa + ssb)    ANCA Screen Reflex Titer    Mpo/pr-3 (anca) antibodies    CK Total (and CKMB)    RNP Antibodies    Aldolase    Hypersensitivity pnuemonitis profile    Nitric oxide    Pulmonary function test    CBC w/Diff    IgE  2. Dyspnea on exertion R06.09 CT Chest High Resolution  3. Chest crackles R09.89   4. Personal history of rheumatoid arthritis Z87.39   5. Stopped smoking with greater than 30 pack year history Z87.891   6. Cough R05    She appears to have a mixture of interstitial lung disease not otherwise specified and obstructive lung disease with asthma phenotype.  That is interesting in itself.  At this point in time I will try to sort out the extent of either problem.  Given the fact that her exam nitric oxide is somewhat high and she is wheezing we will also give her prednisone     Plan:      Please take prednisone 40 mg x1 day,  then 30 mg x1 day, then 20 mg x1 day, then 10 mg x1  day, and then 5 mg x1 day and stop  Increase symbicort from 1 puff twice daily to 2 puff twice daily  Do HRCT supine and prone per ILD protocol  Do Serum: ESR, ACE, ANA, DS-DNA, RF, anti-CCP, ssA, ssB, scl-70, ANCA screen, MPO, PR-3, Total CK,  RNP, Aldolase,  Hypersensitivity Pneumonitis Panel  Do Blood CBC with diff and blood IgE level  Do Pre-bd spiro and post BD spiro and dlco only. No lung volume or bd response next few weeks anytime   Followup  - return next few weeks but after completing above; ok to book in ILD clinic   Dr. Brand Males, M.D., [facility].C.P Pulmonary and Critical Care Medicine Staff Physician, [place] Director - Interstitial Lung Disease  Program  Pulmonary [place] at [place], [place], [address]  Pager: [phone], If no answer or between  15:00h - 7:00h: call [phone] Telephone: [phone]

## 2017-11-07 LAB — HYPERSENSITIVITY PNUEMONITIS PROFILE
ASPERGILLUS FUMIGATUS: NEGATIVE
FAENIA RETIVIRGULA: NEGATIVE
PIGEON SERUM: NEGATIVE
S. VIRIDIS: NEGATIVE
T. CANDIDUS: NEGATIVE
T. VULGARIS: NEGATIVE

## 2017-11-07 LAB — ANCA SCREEN W REFLEX TITER: ANCA Screen: NEGATIVE

## 2017-11-07 LAB — CK TOTAL AND CKMB (NOT AT ARMC)
CK TOTAL: 77 U/L (ref 29–143)
CK, MB: 3.9 ng/mL (ref 0–5.0)
Relative Index: 5.1 — ABNORMAL HIGH (ref 0–4.0)

## 2017-11-07 LAB — CYCLIC CITRUL PEPTIDE ANTIBODY, IGG

## 2017-11-07 LAB — ANA: ANA: NEGATIVE

## 2017-11-07 LAB — RHEUMATOID FACTOR

## 2017-11-07 LAB — ANTI-DNA ANTIBODY, DOUBLE-STRANDED: ds DNA Ab: <1 IU/mL

## 2017-11-07 LAB — ALDOLASE: ALDOLASE: 4.7 U/L (ref <=8.1)

## 2017-11-07 LAB — IGE: IgE (Immunoglobulin E), Serum: 252 kU/L — ABNORMAL HIGH (ref <=114)

## 2017-11-07 LAB — ANGIOTENSIN CONVERTING ENZYME: ANGIOTENSIN-CONVERTING ENZYME: 25 U/L (ref 9–67)

## 2017-11-14 ENCOUNTER — Ambulatory Visit (INDEPENDENT_AMBULATORY_CARE_PROVIDER_SITE_OTHER)
Admission: RE | Admit: 2017-11-14 | Discharge: 2017-11-14 | Disposition: A | Discharge status: Home / Self Care | Payer: Medicare Other | Source: Ambulatory Visit | Attending: Internal Medicine | Admitting: Internal Medicine

## 2017-11-14 DIAGNOSIS — R0609 Other forms of dyspnea: Secondary | ICD-10-CM

## 2017-11-14 DIAGNOSIS — R05 Cough: Secondary | ICD-10-CM

## 2017-11-14 DIAGNOSIS — R053 Chronic cough: Secondary | ICD-10-CM

## 2017-11-14 DIAGNOSIS — R0600 Dyspnea, unspecified: Secondary | ICD-10-CM

## 2017-11-29 ENCOUNTER — Telehealth: Payer: Self-pay | Admitting: Internal Medicine

## 2017-11-29 NOTE — Telephone Encounter (Signed)
Pt is requesting CT results from 11/14/17.  MR please advise. Thanks.

## 2017-11-30 NOTE — Telephone Encounter (Signed)
Pt does have OV with PFT scheduled with MR 12/13/17.  Called pt and relayed all the stated info from MR to pt.  Pt expressed understanding and appreciated the callback letting her know a basis of the results and also that MR would go over everything in more detail with her at her upcoming OV to get a plan situated with her.  Will await the PFT on 12/13/17 followed by OV on 12/13/17.  Nothing further needed at this current time.

## 2017-11-30 NOTE — Telephone Encounter (Signed)
Sorry for signifcant delay but my impresssion was that she would have these tests and see me for followup to discuss. So, I was waiting for her followup .   Results 1. [employer] negative - just like in 2017  2. Blood IgE - indicative a+of allergy  - positive at 252. Slightly hihger than 2017 and c/w 2017 blood allergy panel being positive for various things   3.CT chest - dos have some pulmonary fibrosis similar to may 2018; pattern and reason not fully clear but stable since may 2019   4. CT chest -  does have coronary artery calcification - typically sign of aging but sometimes heart vessel blockage    Plan - await PFT later this month  - all of the above need face to face discussion and planning   Dr. Brand Males, M.D., [facility].C.P Pulmonary and Critical Care Medicine Staff Physician, [place] Director - Interstitial Lung Disease  Program  Pulmonary [place] at [place], [place], [address]  Pager: [phone], If no answer or between  15:00h - 7:00h: call [phone] Telephone: [phone]

## 2017-12-06 ENCOUNTER — Ambulatory Visit (INDEPENDENT_AMBULATORY_CARE_PROVIDER_SITE_OTHER): Payer: Medicare Other | Admitting: Neurology

## 2017-12-06 ENCOUNTER — Telehealth: Payer: Self-pay | Admitting: Neurology

## 2017-12-06 ENCOUNTER — Encounter: Payer: Self-pay | Admitting: Neurology

## 2017-12-06 VITALS — BP 137/83 | HR 104 | Ht 62.0 in | Wt 152.2 lb

## 2017-12-06 DIAGNOSIS — G450 Vertebro-basilar artery syndrome: Secondary | ICD-10-CM

## 2017-12-06 MED ORDER — CLOPIDOGREL BISULFATE 75 MG PO TABS: 75.0000 mg | ORAL_TABLET | Freq: Every day | ORAL | 0 refills | Status: DC

## 2017-12-06 NOTE — Telephone Encounter (Signed)
12/06/17 medicare order sent to [facility]

## 2017-12-06 NOTE — Patient Instructions (Signed)
I had a long discussion with the patient and her husband regarding her recent episode of posterior circulation TIA. I recommend she continue aspirin 81 mg daily but also add Plavix 75 mg daily for 3 weeks only to reduce risk for recurrent TIA and strokes. Check echocardiogram, MRA of the brain and neck. I have counseled her to quit marijuana. Strict control of hypertension with blood pressure goal below 130/90, lipids with LDL cholesterol goal below 70 mg percent. I recommend she increase the dose of Lipitor to 40 mg daily since 10 mg is unlikely to achieve the target LDL below 70. She was also encouraged to eat healthy with a diet of cereals, fruits, vegetables and to be active and exercise regularly. She may also consider possible participation in the [place] stroke trial if interested She will return for follow-up in 6 weeks. Call earlier if necessary.   Stroke Prevention Some medical conditions and behaviors are associated with a higher chance of having a stroke. You can help prevent a stroke by making nutrition, lifestyle, and other changes, including managing any medical conditions you may have. What nutrition changes can be made?  Eat healthy foods. You can do this by: ? Choosing foods high in fiber, such as fresh fruits and vegetables and whole grains. ? Eating at least 5 or more servings of fruits and vegetables a day. Try to fill half of your plate at each meal with fruits and vegetables. ? Choosing lean protein foods, such as lean cuts of meat, poultry without skin, fish, tofu, beans, and nuts. ? Eating low-fat dairy products. ? Avoiding foods that are high in salt (sodium). This can help lower blood pressure. ? Avoiding foods that have saturated fat, trans fat, and cholesterol. This can help prevent high cholesterol. ? Avoiding processed and premade foods.  Follow your health care provider's specific guidelines for losing weight, controlling high blood pressure (hypertension), lowering  high cholesterol, and managing diabetes. These may include: ? Reducing your daily calorie intake. ? Limiting your daily sodium intake to 1,500 milligrams (mg). ? Using only healthy fats for cooking, such as olive oil, canola oil, or sunflower oil. ? Counting your daily carbohydrate intake. What lifestyle changes can be made?  Maintain a healthy weight. Talk to your health care provider about your ideal weight.  Get at least 30 minutes of moderate physical activity at least 5 days a week. Moderate activity includes brisk walking, biking, and swimming.  Do not use any products that contain nicotine or tobacco, such as cigarettes and e-cigarettes. If you need help quitting, ask your health care provider. It may also be helpful to avoid exposure to secondhand smoke.  Limit alcohol intake to no more than 1 drink a day for nonpregnant women and 2 drinks a day for men. One drink equals 12 oz of beer, 5 oz of wine, or 1 oz of hard liquor.  Stop any illegal drug use.  Avoid taking birth control pills. Talk to your health care provider about the risks of taking birth control pills if: ? You are over 52 years old. ? You smoke. ? You get migraines. ? You have ever had a blood clot. What other changes can be made?  Manage your cholesterol levels. ? Eating a healthy diet is important for preventing high cholesterol. If cholesterol cannot be managed through diet alone, you may also need to take medicines. ? Take any prescribed medicines to control your cholesterol as told by your health care provider.  Manage your  diabetes. ? Eating a healthy diet and exercising regularly are important parts of managing your blood sugar. If your blood sugar cannot be managed through diet and exercise, you may need to take medicines. ? Take any prescribed medicines to control your diabetes as told by your health care provider.  Control your hypertension. ? To reduce your risk of stroke, try to keep your blood  pressure below 130/80. ? Eating a healthy diet and exercising regularly are an important part of controlling your blood pressure. If your blood pressure cannot be managed through diet and exercise, you may need to take medicines. ? Take any prescribed medicines to control hypertension as told by your health care provider. ? Ask your health care provider if you should monitor your blood pressure at home. ? Have your blood pressure checked every year, even if your blood pressure is normal. Blood pressure increases with age and some medical conditions.  Get evaluated for sleep disorders (sleep apnea). Talk to your health care provider about getting a sleep evaluation if you snore a lot or have excessive sleepiness.  Take over-the-counter and prescription medicines only as told by your health care provider. Aspirin or blood thinners (antiplatelets or anticoagulants) may be recommended to reduce your risk of forming blood clots that can lead to stroke.  Make sure that any other medical conditions you have, such as atrial fibrillation or atherosclerosis, are managed. What are the warning signs of a stroke? The warning signs of a stroke can be easily remembered as BEFAST.  B is for balance. Signs include: ? Dizziness. ? Loss of balance or coordination. ? Sudden trouble walking.  E is for eyes. Signs include: ? A sudden change in vision. ? Trouble seeing.  F is for face. Signs include: ? Sudden weakness or numbness of the face. ? The face or eyelid drooping to one side.  A is for arms. Signs include: ? Sudden weakness or numbness of the arm, usually on one side of the body.  S is for speech. Signs include: ? Trouble speaking (aphasia). ? Trouble understanding.  T is for time. ? These symptoms may represent a serious problem that is an emergency. Do not wait to see if the symptoms will go away. Get medical help right away. Call your local emergency services (911 in the [place] Do not drive  yourself to the hospital.  Other signs of stroke may include: ? A sudden, severe headache with no known cause. ? Nausea or vomiting. ? Seizure.  Where to find more information: For more information, visit:  American Stroke Association: www.strokeassociation.org  National Stroke Association: www.stroke.org  Summary  You can prevent a stroke by eating healthy, exercising, not smoking, limiting alcohol intake, and managing any medical conditions you may have.  Do not use any products that contain nicotine or tobacco, such as cigarettes and e-cigarettes. If you need help quitting, ask your health care provider. It may also be helpful to avoid exposure to secondhand smoke.  Remember BEFAST for warning signs of stroke. Get help right away if you or a loved one has any of these signs. This information is not intended to replace advice given to you by your health care provider. Make sure you discuss any questions you have with your health care provider. Document Released: 10/21/2004 Document Revised: 10/19/2016 Document Reviewed: 10/19/2016 Elsevier Interactive Patient Education  [employer].

## 2017-12-06 NOTE — Progress Notes (Signed)
[facility] [address] [place]. [place] [address] [phone]       OFFICE CONSULT NOTE  Sharon. Sharon [name] Date of Birth:  Oct 09, 1950 Medical Record Number:  [id number]   Referring MD:  Vicenta Aly, NP Reason for Referral:  TIA HPI: Sharon [name] is a 67 year pleasant Caucasian lady who is accompanied today by her husband. History is obtained from them as well as review of referral notes sent by primary physician as well as review in care everywhere. I have personally reviewed MRI  imaging films which was sent on a disk. She states that on 11/11/17 she was watching TV when she noticed sudden onset of vertigo with the room spinning and trouble speaking with nonfluent speech which is slightly slurred. She is able to communicating speak to her husband. She was aware of her surroundings. She denied any accompanying nausea vomiting or loss of vision. The husband called 80 but by the time they arrived patient was feeling better. She refused to go to the hospital. She did feel it drained and tired for the rest of the day. She did not see her primary physician right away but eventually when she saw she was referred for an outpatient MRI that shows done on 11/28/17 which are personally reviewed shows only mild changes of chronic microvascular ischemia without acute abnormality. Carotid ultrasound also done on 11/28/17 shows no significant extracranial stenosis. Last lab work for cholesterol was on 09/24/17 which had shown an LDL cholesterol of 167 mg percent. She is not a diabetic and on her sugars including recent ones have been satisfactory Patient was on Lipitor 20 the dose of which was increased by primary physician to 40 mg daily. She has also been started on aspirin 81 mg daily she has been taking and is tolerating both medications without any side effects. Patient denies any prior history of strokes or TIAs but does have a family history of strokes and was sister and mother. She states  she's been eating healthy and is trying to lose weight. She has no prior history of neurological problems. She states her blood pressure is usually well controlled and today it is borderline at 137/85.  ROS:   14 system review of systems is positive for  shortness of breath, cough, wheezing, dizziness, speech difficulty and all other systems negative PMH:  Past Medical History:  Diagnosis Date  . Chronic major depressive disorder   . DDD (degenerative disc disease), cervical   . Depression   . Hypertension   . Mixed hyperlipidemia   . Pain in joints of both feet   . Pneumonia 2016  . Polyarthropathy   . Vision abnormalities    catracts left eye  . Vitamin D deficiency     Social History:  Social History   Socioeconomic History  . Marital status: Married    Spouse name: Not on file  . Number of children: Not on file  . Years of education: Not on file  . Highest education level: Not on file  Social Needs  . Financial resource strain: Not on file  . Food insecurity - worry: Not on file  . Food insecurity - inability: Not on file  . Transportation needs - medical: Not on file  . Transportation needs - non-medical: Not on file  Occupational History  . Not on file  Tobacco Use  . Smoking status: Former Smoker    Packs/day: 1.00    Years: 30.00    Pack years: 30.00  Types: Cigarettes    Last attempt to quit: 02/03/1979    Years since quitting: 38.8  . Smokeless tobacco: Never Used  Substance and Sexual Activity  . Alcohol use: Yes    Alcohol/week: 0.6 oz    Types: 1 Standard drinks or equivalent per week  . Drug use: Yes    Frequency: 7.0 times per week    Comment: 10 yrs. to current  . Sexual activity: Not on file  Other Topics Concern  . Not on file  Social History Narrative   Lives with husband.   Homemaker.   2 children    Medications:   Current Outpatient Medications on File Prior to Visit  Medication Sig Dispense Refill  . albuterol (PROVENTIL  HFA;VENTOLIN HFA) 108 (90 BASE) MCG/ACT inhaler Inhale 2 puffs into the lungs every 4 (four) hours as needed for wheezing or shortness of breath.    . ALPRAZolam (XANAX) 0.5 MG tablet Take 0.25-0.5 mg by mouth daily as needed for anxiety.    Marland Kitchen atorvastatin (LIPITOR) 10 MG tablet Take 40 mg by mouth every morning.    . budesonide-formoterol (SYMBICORT) 80-4.5 MCG/ACT inhaler Take 2 puffs first thing in am and then another 2 puffs about 12 hours later. 1 Inhaler 12  . dextromethorphan-guaiFENesin (MUCINEX DM) 30-600 MG per 12 hr tablet Take 1 tablet by mouth 2 (two) times daily as needed.     . DULoxetine (CYMBALTA) 30 MG capsule Take one cap daily at bedtime x 1 week then 2 caps daily at bedtime.    Marland Kitchen losartan (COZAAR) 50 MG tablet     . predniSONE (DELTASONE) 10 MG tablet Take 40mg x1day,30mg x1day,20mg x1day,10mg x1day,5mg x1day,then stop 11 tablet 0  . predniSONE (DELTASONE) 20 MG tablet     . traMADol (ULTRAM) 50 MG tablet Take 50-100 mg by mouth daily.     . travoprost, benzalkonium, (TRAVATAN) 0.004 % ophthalmic solution Place 1 drop into both eyes at bedtime.     No current facility-administered medications on file prior to visit.     Allergies:   Allergies  Allergen Reactions  . Codeine     GI upset    Physical Exam General: well developed, well nourished 67-year-old Caucasian lady, seated, in no evident distress Head: head normocephalic and atraumatic.   Neck: supple with no carotid or supraclavicular bruits Cardiovascular: regular rate and rhythm, no murmurs Musculoskeletal: no deformity Skin:  no rash/petichiae Vascular:  Normal pulses all extremities  Neurologic Exam Mental Status: Awake and fully alert. Oriented to place and time. Recent and remote memory intact. Attention span, concentration and fund of knowledge appropriate. Mood and affect appropriate.  Cranial Nerves: Fundoscopic exam reveals sharp disc margins. Pupils equal, briskly reactive to light. Extraocular movements  full without nystagmus. Visual fields full to confrontation. Hearing intact. Facial sensation intact. Face, tongue, palate moves normally and symmetrically.  Motor: Normal bulk and tone. Normal strength in all tested extremity muscles. Sensory.: intact to touch , pinprick , position and vibratory sensation.  Coordination: Rapid alternating movements normal in all extremities. Finger-to-nose and heel-to-shin performed accurately bilaterally. Gait and Station: Arises from chair without difficulty. Stance is normal. Gait demonstrates normal stride length and balance . Able to heel, toe and tandem walk without difficulty.  Reflexes: 1+ and symmetric. Toes downgoing.   NIHSS 0 Modified Rankin  0  ASSESSMENT: 67 year old Caucasian lady with transient episode of vertigo, dizziness and speech difficulties likely due to a posterior circulation TIA. Vascular risk factors of hyperlipidemia and hypertension only.    PLAN:  I had a long discussion with the patient and her husband regarding her recent episode of posterior circulation TIA. I recommend she continue aspirin 81 mg daily but also add Plavix 75 mg daily for 3 weeks only to reduce risk for recurrent TIA and strokes. Check echocardiogram, MRA of the brain and neck. I have counseled her to quit marijuana. Strict control of hypertension with blood pressure goal below 130/90, lipids with LDL cholesterol goal below 70 mg percent. I recommend she increase the dose of Lipitor to 40 mg daily since 10 mg is unlikely to achieve the target LDL below 70. She was also encouraged to eat healthy with a diet of cereals, fruits, vegetables and to be active and exercise regularly. Greater than 50% time during this 45 minute consultation visit was spent on counseling and coordination of care about her TIA and discussion about stroke prevention, treatment and answering questions She may also consider possible participation in the [place] stroke trial if interested She will  return for follow-up in 6 weeks. Call earlier if necessary. Antony Contras, MD  [facility] [address] [place], [place] [address]  Phone [phone] Fax [phone] Note: This document was prepared with digital dictation and possible smart phrase technology. Any transcriptional errors that result from this process are unintentional.

## 2017-12-07 LAB — BASIC METABOLIC PANEL
BUN/Creatinine Ratio: 11 — ABNORMAL LOW (ref 12–28)
BUN: 11 mg/dL (ref 8–27)
CALCIUM: 10.2 mg/dL (ref 8.7–10.3)
CHLORIDE: 101 mmol/L (ref 96–106)
CO2: 20 mmol/L (ref 20–29)
Creatinine, Ser: 0.98 mg/dL (ref 0.57–1.00)
GFR, EST AFRICAN AMERICAN: 70 mL/min/{1.73_m2} (ref >59)
GFR, EST NON AFRICAN AMERICAN: 61 mL/min/{1.73_m2} (ref >59)
Glucose: 118 mg/dL — ABNORMAL HIGH (ref 65–99)
Potassium: 4.9 mmol/L (ref 3.5–5.2)
Sodium: 139 mmol/L (ref 134–144)

## 2017-12-08 ENCOUNTER — Telehealth: Payer: Self-pay | Admitting: *Deleted

## 2017-12-08 NOTE — Telephone Encounter (Signed)
-----   Message from Garvin Fila, MD sent at 12/07/2017  5:53 PM EDT ----- Mitchell Heir inform the patient that basic metabolic panel labs were normal

## 2017-12-08 NOTE — Telephone Encounter (Signed)
LMVM for pt, (ok LM per DPR home #) that lab results normal per Dr. Leonie Man.  If questions to return call.

## 2017-12-12 ENCOUNTER — Ambulatory Visit (HOSPITAL_COMMUNITY): Payer: Medicare Other | Attending: Cardiovascular Disease

## 2017-12-12 ENCOUNTER — Other Ambulatory Visit: Payer: Self-pay

## 2017-12-12 DIAGNOSIS — I119 Hypertensive heart disease without heart failure: Secondary | ICD-10-CM | POA: Diagnosis not present

## 2017-12-12 DIAGNOSIS — G450 Vertebro-basilar artery syndrome: Secondary | ICD-10-CM | POA: Diagnosis not present

## 2017-12-12 DIAGNOSIS — Z87891 Personal history of nicotine dependence: Secondary | ICD-10-CM | POA: Insufficient documentation

## 2017-12-12 DIAGNOSIS — E785 Hyperlipidemia, unspecified: Secondary | ICD-10-CM | POA: Insufficient documentation

## 2017-12-13 ENCOUNTER — Ambulatory Visit (INDEPENDENT_AMBULATORY_CARE_PROVIDER_SITE_OTHER): Payer: Medicare Other | Admitting: Internal Medicine

## 2017-12-13 ENCOUNTER — Ambulatory Visit
Admission: RE | Admit: 2017-12-13 | Discharge: 2017-12-13 | Disposition: A | Discharge status: Home / Self Care | Payer: Medicare Other | Source: Ambulatory Visit | Attending: Neurology | Admitting: Neurology

## 2017-12-13 ENCOUNTER — Other Ambulatory Visit: Payer: Medicare Other

## 2017-12-13 ENCOUNTER — Encounter: Payer: Self-pay | Admitting: Internal Medicine

## 2017-12-13 VITALS — BP 122/72 | HR 100 | Ht 61.5 in | Wt 152.0 lb

## 2017-12-13 DIAGNOSIS — R053 Chronic cough: Secondary | ICD-10-CM

## 2017-12-13 DIAGNOSIS — R768 Other specified abnormal immunological findings in serum: Secondary | ICD-10-CM

## 2017-12-13 DIAGNOSIS — G450 Vertebro-basilar artery syndrome: Secondary | ICD-10-CM

## 2017-12-13 DIAGNOSIS — J849 Interstitial pulmonary disease, unspecified: Secondary | ICD-10-CM

## 2017-12-13 DIAGNOSIS — R942 Abnormal results of pulmonary function studies: Secondary | ICD-10-CM

## 2017-12-13 DIAGNOSIS — R05 Cough: Secondary | ICD-10-CM

## 2017-12-13 LAB — PULMONARY FUNCTION TEST
DL/VA % PRED: 106 %
DL/VA: 4.76 ml/min/mmHg/L
DLCO UNC % PRED: 66 %
DLCO cor % pred: 68 %
DLCO cor: 14.35 ml/min/mmHg
DLCO unc: 13.89 ml/min/mmHg
FEF 25-75 POST: 3.54 L/s
FEF 25-75 PRE: 2.12 L/s
FEF2575-%Change-Post: 66 %
FEF2575-%PRED-POST: 182 %
FEF2575-%PRED-PRE: 109 %
FEV1-%Change-Post: 26 %
FEV1-%Pred-Post: 81 %
FEV1-%Pred-Pre: 64 %
FEV1-PRE: 1.39 L
FEV1-Post: 1.76 L
FEV1FVC-%Change-Post: 7 %
FEV1FVC-%PRED-PRE: 109 %
FEV6-%CHANGE-POST: 17 %
FEV6-%PRED-POST: 71 %
FEV6-%Pred-Pre: 60 %
FEV6-Post: 1.93 L
FEV6-Pre: 1.64 L
FEV6FVC-%CHANGE-POST: 0 %
FEV6FVC-%Pred-Post: 103 %
FEV6FVC-%Pred-Pre: 103 %
FVC-%Change-Post: 17 %
FVC-%PRED-POST: 68 %
FVC-%Pred-Pre: 58 %
FVC-Post: 1.94 L
FVC-Pre: 1.65 L
PRE FEV1/FVC RATIO: 85 %
Post FEV1/FVC ratio: 91 %
Post FEV6/FVC ratio: 100 %
Pre FEV6/FVC Ratio: 100 %

## 2017-12-13 MED ORDER — GADOBENATE DIMEGLUMINE 529 MG/ML IV SOLN
14.0000 mL | Freq: Once | INTRAVENOUS | Status: AC | PRN
  Administered 2017-12-13: 14 mL via INTRAVENOUS

## 2017-12-13 NOTE — Patient Instructions (Addendum)
ICD-10-CM   1. ILD (interstitial lung disease) ([place]) J84.9 Ambulatory referral to Cardiothoracic Surgery    RNP Antibodies    Mpo/pr-3 ([place]) antibodies    Sjogren's syndrome antibods(ssa + ssb)  2. 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 Abnormal PFT R94.2    ILD (interstitial lung disease) ([place]) - Plan: Ambulatory referral to Cardiothoracic Surgery, RNP Antibodies, Mpo/pr-3 (anca) antibodies, Sjogren's syndrome antibods(ssa + ssb)   Different possibiliteis for ILD/Pulmonary Fibrosis  Main ones are hypersensitivity pneumonitis from raking leaves, IPF associated with prior smoking, NSIP that is seen in women in your age  No clear cut evidence of autoimmune lung disease   History, CT, not pointing to a clear pattern   Elevated IgE level Abnormal PFT  There might be associated asthma/vcd based on elevated IgE and flow volume loop abnormalites on PFT  Plan - continue symbicort - do remainder of auotimmune panel missed at last vist  -r efer Dr Roxan Hockey for surgical lung biopsy evaluation - followup 4 weeks in ILD clinic

## 2017-12-13 NOTE — Progress Notes (Signed)
Patient completed PFT, no pleth today per MR request.

## 2017-12-13 NOTE — Progress Notes (Signed)
Subjective:     Patient ID: Sharon [name], female   DOB: Dec 22, 1950, 67 y.o.   MRN: [id number]  HPI    OV 11/01/2017 - ILD clinic   - transfer of care and 2nd opinion  Chief Complaint  Patient presents with  . Advice Only    Referred by Dr. Melford [name] due to an abnormal CT.  Pt does have complaints of a dry cough and SOB with exertion or if has a coughing episode.    67 year old female referred by the primary care physician to the ILD clinic for evaluation of her pulmonary problems with symptoms of cough and shortness of breath.  According to the patient for a better part of 2 years she has had episodic cough particularly in the fall season.  She says she is active in the outdoors doing gardening and cutting and blowing and mowing and she usually wears a mask but nevertheless she will get a bronchitis episode that will require nebulizers and prednisone to resolve.  Symptoms will usually resolve over 2-3 weeks.  However this time around it is taken 8 weeks to resolve.  Last prednisone was over a week or 2 ago.  She still has some ongoing wheezing.  In the background of this episodic cough she says between episodes she does not have any cough but she does have some baseline shortness of breath when she climbs a flight of stairs this is been stable and is of insidious onset also present for 2 years.  She did have a CT scan of the chest approximately 9 months ago in May 2018 that shows in my personal opinion based upon my personal visualization bilateral subpleural reticulation particularly in the upper lobes and some subpleural reticulation in the left lower lobe.  There is no clear distinct craniocaudal gradient in my opinion this would be indeterminate for UIP without a clear alternate etiology.  Given the presence of ILD findings she has been sent to the ILD clinic.  Review of the chart also shows she is seen.my colleague Dr. Melvyn [name] for asthma symptoms and is been placed on Symbicort which she takes 1 puff  twice daily.  The exam nitric oxide a week after prednisone and while on Symbicort is elevated to near abnormal levels at 48 ppb today  [employer] of chest physicians interstitial lung disease questionnaire -Past medical history: Positive for allergies.  IgE was elevated to 168 approximately a year or 2 ago on chart review.  In addition she gives a history of rheumatoid arthritis diagnosed many many years ago.  Seen by Dr. Keturah [name] at that time.  She is only followed up with nonsteroidal anti-inflammatory drugs.  She has not followed up with Dr. Keturah [name] in many years.  She feels she needs to go back.  However in May 2017 her autoimmune limited profile done here was negative.  -Personal exposure history: She has smoked marijuana in the past.  She smokes cigarettes from age 13 to age 88 a pack a day and quit.  -Family history of lung disease: Sister Sharon [name] who lives in [place] apparently has had a lung biopsy and has been diagnosed with sarcoidosis recently.  She is also a smoker.  Father died of congestive heart failure.  There is no diagnosis of pulmonary fibrosis or hypersensitivity pneumonitis  -Home exposure history: Has not lived in a house that is old in the last 10 years.  There is no humidifier or insomnia or hot tub or Jacuzzi or water damage or mold.  She has 1 dog.  She does not have any birds.  She does not use any feathered pillows.  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 Range 02/03/2016 11:20  Anit Nuclear Antibody(ANA) Latest Ref Range:  NEGATIVE  NEG  Cyclic Citrullin Peptide Ab Latest Units: Units <16  RA Latex Turbid. Latest Ref Range: <=14 IU/mL <10    Walking desaturation test on 11/01/2017 185 feet x 3 laps on ROOM AIR:  did not desaturate. Rest pulse ox was 100%, final pulse ox was 97%. HR response 88/min at rest to 100/min at peak exertion. Patient Sharon [name]  Did not Desaturate < 88% . Sharon [name] yes did  Desaturated </= 3% points. Sharon [name] yes did get tachyardic   FeNO - 48ppb and almost abnormal   OV 12/13/2017  Chief Complaint  Patient presents with  . Follow-up    PFT done today. Pt states after last visit on 11/01/17, she developed bronchitis and had it x2 weeks. Pt states she still has a mild cough. Denies any SOB or CP.   Follow-up interstitial lung disease with asthma/allergy phenotype  She returns for follow-up after investigations.  [name] who is well-known to me through working to medical ICU where she is a Equities trader.  We did a history read taken patient tells me that she moved from [place] to [place] many years ago.  She does regular gardening and works in the yard [employer].  Many times the leads are damp and there is she suspects mold in it.  She also burns the lesion is exposed to the smoke.  Symbicort is helping but approximately 2 weeks ago had respiratory exacerbation that was not related to gardening [in fact she has not gotten this whole year] and ended up in the ER treated with antibiotics and prednisone.  Currently back to baseline.  She had pulmonary function test today that shows mixed obstruction and restriction associated with flow volume loop abnormalities.  She had a hypersensitivity pneumonitis panel documented below that is negative.  She had limited autoimmune panel [the CMA at last visit did not order the full panel] and this was normal.  She had repeat CT scan of the chest read by thoracic radiology I personally  visualized this and agree with the findings.  It is indeterminate for UIP and there is no specific alternate pattern.  No  air trapping is described.  The differential diagnosis appears to be broad.     Results for Sharon [name], Sharon [name] (MRN [id number]) as of 12/13/2017 09:57  Ref. Range 11/01/2017 16:22  Faenia retivirgula Latest Ref Range: NEGATIVE  NEGATIVE  S. VIRIDIS Latest Ref Range: NEGATIVE  NEGATIVE  T. CANDIDUS Latest Ref Range: NEGATIVE  NEGATIVE  T. VULGARIS Latest Ref Range: NEGATIVE  NEGATIVE    Results for Sharon [name], Sharon [name] (MRN [id number]) as of 12/13/2017 09:57  Ref. Range 11/01/2017 16:22  Anit Nuclear Antibody(ANA) Latest Ref Range: NEGATIVE  NEGATIVE  Angiotensin-Converting Enzyme Latest Ref Range: 9 - 67 U/L 25  Cyclic Citrullin  Peptide Ab Latest Units: UNITS <16  ds DNA Ab Latest Units: IU/mL <1  RA Latex Turbid. Latest Ref Range: <14 IU/mL <14  IgE (Immunoglobulin E), Serum Latest Ref Range: <OR=114 kU/L 252 (H)    IMPRESSION: CT FEb 2019 1. Pulmonary parenchymal pattern of fibrotic interstitial lung disease is unchanged from 02/07/2017 and may be due to nonspecific interstitial pneumonitis or mild chronic hypersensitivity pneumonitis. Findings are not consistent with usual interstitial pneumonitis. 2. Aortic atherosclerosis (ICD10-170.0). Coronary artery calcification.   Electronically Signed   By: Lorin Picket M.D.   On: 11/14/2017 12:56   Results for Sharon [name], Sharon [name] (MRN [id number]) as of 12/13/2017 09:57  Ref. Range 12/13/2017 09:03  FVC-Pre Latest Units: L 1.65  FVC-%Pred-Pre Latest Units: % 58  FEV1-Pre Latest Units: L 1.39  FEV1-%Pred-Pre Latest Units: % 64  Pre FEV1/FVC ratio Latest Units: % 85  Results for Sharon [name], Sharon [name] (MRN [id number]) as of 12/13/2017 09:57  Ref. Range 12/13/2017 09:03  FEV1-%Change-Post Latest Units: % 26  Results for Sharon [name], Sharon [name] (MRN [id number]) as of 12/13/2017 09:57  Ref. Range 12/13/2017 09:03  DLCO cor Latest Units:  ml/min/mmHg 14.35  DLCO cor % pred Latest Units: % 68     has a past medical history of Chronic major depressive disorder, DDD (degenerative disc disease), cervical, Depression, Hypertension, Mixed hyperlipidemia, Pain in joints of both feet, Pneumonia (2016), Polyarthropathy, Vision abnormalities, and Vitamin D deficiency.   reports that she quit smoking about 38 years ago. Her smoking use included cigarettes. She has a 30.00 pack-year smoking history. she has never used smokeless tobacco.  Past Surgical History:  Procedure Laterality Date  . CARPAL TUNNEL RELEASE    . CESAREAN SECTION    . TAYLOR BUNIONECTOMY      Allergies  Allergen Reactions  . Codeine     GI upset    Immunization History  Administered Date(s) Administered  . Influenza, High Dose Seasonal PF 10/25/2017  . Influenza-Unspecified 09/27/2013  . Pneumococcal Conjugate-13 10/25/2017    Family History  Problem Relation Age of Onset  . Emphysema Father   . Asthma Father   . Congestive Heart Failure Father   . Stroke Father   . Asthma Sister   . COPD Sister   . Stroke Mother      Current Outpatient Medications:  .  albuterol (PROVENTIL HFA;VENTOLIN HFA) 108 (90 BASE) MCG/ACT inhaler, Inhale 2 puffs into the lungs every 4 (four) hours as needed for wheezing or shortness of breath., Disp: , Rfl:  .  atorvastatin (LIPITOR) 10 MG tablet, Take 40 mg by mouth every morning., Disp: , Rfl:  .  budesonide-formoterol (SYMBICORT) 80-4.5 MCG/ACT inhaler, Take 2 puffs first thing in am and then another 2 puffs about 12 hours later., Disp: 1 Inhaler, Rfl: 12 .  clopidogrel (PLAVIX) 75 MG tablet, Take 1 tablet (75 mg total) by mouth daily., Disp: 21 tablet, Rfl: 0 .  DULoxetine (CYMBALTA) 30 MG capsule, Take one cap daily at bedtime x 1 week then 2 caps daily at bedtime., Disp: , Rfl:  .  losartan (COZAAR) 50 MG tablet, , Disp: , Rfl:  .  traMADol (ULTRAM) 50 MG tablet, Take 50-100 mg by mouth daily. , Disp: , Rfl:  .   travoprost, benzalkonium, (TRAVATAN) 0.004 % ophthalmic solution, Place 1 drop into both eyes at bedtime., Disp: , Rfl:  .  ALPRAZolam (XANAX) 0.5 MG tablet, Take 0.25-0.5 mg by mouth daily as needed for anxiety., Disp: , Rfl:  .  dextromethorphan-guaiFENesin (MUCINEX DM) 30-600 MG per 12 hr tablet, Take 1 tablet by mouth 2 (two) times daily as needed. , Disp: , Rfl:   Review of Systems     Objective:   Physical Exam  Constitutional: She is oriented to person, place, and time. She appears well-developed and well-nourished. No distress.  HENT:  Head: Normocephalic and atraumatic.  Right Ear: External ear normal.  Left Ear: External ear normal.  Mouth/Throat: Oropharynx is clear and moist. No oropharyngeal exudate.  Eyes: Conjunctivae and EOM are normal. Pupils are equal, round, and reactive to light. Right eye exhibits no discharge. Left eye exhibits no discharge. No scleral icterus.  Neck: Normal range of motion. Neck supple. No JVD present. No tracheal deviation present. No thyromegaly present.  Cardiovascular: Normal rate, regular rhythm, normal heart sounds and intact distal pulses. Exam reveals no gallop and no friction rub.  No murmur heard. Pulmonary/Chest: Effort normal and breath sounds normal. No respiratory distress. She has no wheezes. She has no rales. She exhibits no tenderness.  Scattered crackles without clear cranio-caudal gradient  Abdominal: Soft. Bowel sounds are normal. She exhibits no distension and no mass. There is no tenderness. There is no rebound and no guarding.  Musculoskeletal: Normal range of motion. She exhibits no edema or tenderness.  Lymphadenopathy:    She has no cervical adenopathy.  Neurological: She is alert and oriented to person, place, and time. She has normal reflexes. No cranial nerve deficit. She exhibits normal muscle tone. Coordination normal.  Skin: Skin is warm and dry. No rash noted. She is not diaphoretic. No erythema. No pallor.   Psychiatric: She has a normal mood and affect. Her behavior is normal. Judgment and thought content normal.  Vitals reviewed.  Vitals:   12/13/17 0949  BP: 122/72  Pulse: 100  SpO2: 96%  Weight: 152 lb (68.9 kg)  Height: 5' 1.5" (1.562 m)    Estimated body mass index is 28.26 kg/m as calculated from the following:   Height as of this encounter: 5' 1.5" (1.562 m).   Weight as of this encounter: 152 lb (68.9 kg).     Assessment:       ICD-10-CM   1. ILD (interstitial lung disease) ([place]) J84.9 Ambulatory referral to Cardiothoracic Surgery    RNP Antibodies    Mpo/pr-3 (anca) antibodies    Sjogren's syndrome antibods(ssa + ssb)  2. 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 Abnormal PFT R94.2        Plan:     ILD (interstitial lung disease) ([place]) - Plan: Ambulatory referral to Cardiothoracic Surgery, RNP Antibodies, Mpo/pr-3 (anca) antibodies, Sjogren's syndrome antibods(ssa + ssb)   Different possibiliteis for ILD/Pulmonary Fibrosis  Main ones are hypersensitivity pneumonitis from raking leaves, IPF associated with prior smoking, NSIP that is seen in women in your age  No clear cut evidence of autoimmune lung disease   History, CT, not pointing to a clear pattern   Elevated IgE level Abnormal PFT  There might be associated asthma/vcd based on elevated IgE and flow volume loop abnormalites on PFT  Plan - continue symbicort - do remainder of auotimmune panel missed at last vist  -r efer Dr Roxan Hockey for surgical lung biopsy evaluation - followup 4 weeks in ILD clinic   > 50% of this > 25 min visit spent in face to face counseling or coordination of care    Dr. Brand Males, M.D., [facility].C.P Pulmonary and Critical Care Medicine Staff Physician, [facility] Director - Interstitial  Lung Disease  Program  Pulmonary [place] at [place], [place], [address]  Pager: [phone], If no answer or between  15:00h -  7:00h: call [phone] Telephone: [phone]

## 2017-12-14 ENCOUNTER — Telehealth: Payer: Self-pay | Admitting: Neurology

## 2017-12-14 LAB — MPO/PR-3 (ANCA) ANTIBODIES: Myeloperoxidase Abs: 1.2 AI — ABNORMAL HIGH

## 2017-12-14 LAB — SJOGREN'S SYNDROME ANTIBODS(SSA + SSB)
SSA (RO) (ENA) ANTIBODY, IGG: NEGATIVE AI
SSB (La) (ENA) Antibody, IgG: <1 AI

## 2017-12-14 LAB — RNP ANTIBODIES: ENA RNP AB: 0.8 AI (ref 0.0–0.9)

## 2017-12-14 NOTE — Telephone Encounter (Signed)
Called the patient and informed her of her normal Echo results. Pt verbalized understanding. Pt had no questions at this time but was encouraged to call back if questions arise.

## 2017-12-14 NOTE — Telephone Encounter (Signed)
-----   Message from Garvin Fila, MD sent at 12/13/2017  4:37 PM EDT ----- Mitchell Heir inform the patient that echocardiogram study was unremarkable

## 2017-12-21 ENCOUNTER — Encounter: Payer: Self-pay | Admitting: Neurology

## 2017-12-23 ENCOUNTER — Encounter: Payer: Self-pay | Admitting: Neurology

## 2017-12-26 ENCOUNTER — Telehealth: Payer: Self-pay

## 2017-12-26 ENCOUNTER — Other Ambulatory Visit: Payer: Self-pay

## 2017-12-26 MED ORDER — ATORVASTATIN CALCIUM 40 MG PO TABS: 40.0000 mg | ORAL_TABLET | Freq: Every day | ORAL | 1 refills | Status: DC

## 2017-12-26 NOTE — Telephone Encounter (Signed)
Rn call patient about her Mra head, and MRA neck. Rn stated it shows only minor irregularities without major blockages in neck or the brain. No need to change treatment. No worrisome findings. PT verbalized understanding. ------

## 2017-12-26 NOTE — Telephone Encounter (Signed)
DR.Sethi can you call patient about her MRI. She has a lot of questions via my chart. Please advised.

## 2017-12-26 NOTE — Addendum Note (Signed)
Addended by: Marval Regal on: 12/26/2017 11:26 AM   Modules accepted: Orders

## 2017-12-27 ENCOUNTER — Encounter: Payer: Self-pay | Admitting: Thoracic Surgery (Cardiothoracic Vascular Surgery)

## 2017-12-27 ENCOUNTER — Institutional Professional Consult (permissible substitution) (INDEPENDENT_AMBULATORY_CARE_PROVIDER_SITE_OTHER): Payer: Medicare Other | Admitting: Thoracic Surgery (Cardiothoracic Vascular Surgery)

## 2017-12-27 VITALS — BP 137/80 | HR 78 | Resp 20 | Ht 61.5 in | Wt 149.0 lb

## 2017-12-27 DIAGNOSIS — J849 Interstitial pulmonary disease, unspecified: Secondary | ICD-10-CM

## 2017-12-27 NOTE — Progress Notes (Signed)
PCP is Chesley Noon, MD Referring Provider is Brand Males, MD  Chief Complaint  Patient presents with  . Interstitial Lung Disease    Surgical eval, lung biopsy evaluation Chest CT 11/14/17, PFT 12/13/17,     HPI: Sharon [name] is sent for consultation regarding a lung biopsy for interstitial lung disease.  Sharon [name] is a 67 year old woman with a past medical history significant for hypertension, hyperlipidemia, depression, degenerative disc disease, polyarthropathy, pneumonia, interstitial lung disease, and a recent TIA.  Her history of lung disease dates back several years with episodic coughing.  She said that she would get bronchitis with a severe cough about once a year.  She had a particularly bad episode in December 2018.  She had a severe persistent cough, general malaise, and wheezing.  She was treated empirically with antibiotics and prednisone.  She says that she has been treated 3 times and each time she would improve but is soon as the prednisone stopped her symptoms would worsen again.  She had a CT in February which showed probable interstitial lung disease.  The pattern was not classic for UIP.  She was referred to Dr. Chase Caller.  Workup including an autoimmune panel was nondiagnostic.  He suspects hypersensitivity pneumonitis, idiopathic pulmonary fibrosis, or nonspecific interstitial pneumonitis.  He needs a lung biopsy for diagnostic purposes.  She had an episode about 6 weeks ago with altered vision in her right eye and difficulty speaking.  EMS was called but by the time they arrived her symptoms had improved and she did not go to the hospital.  She was referred to Dr. Leonie Man.  His note indicates she had a posterior circulation transient ischemic attack.  She was started on Plavix.  Past Medical History:  Diagnosis Date  . Chronic major depressive disorder   . DDD (degenerative disc disease), cervical   . Depression   . Hypertension   . Mixed hyperlipidemia   .  Pain in joints of both feet   . Pneumonia 2016  . Polyarthropathy   . Vision abnormalities    catracts left eye  . Vitamin D deficiency     Past Surgical History:  Procedure Laterality Date  . CARPAL TUNNEL RELEASE    . CESAREAN SECTION    . TAYLOR BUNIONECTOMY      Family History  Problem Relation Age of Onset  . Emphysema Father   . Asthma Father   . Congestive Heart Failure Father   . Stroke Father   . Asthma Sister   . COPD Sister   . Stroke Mother     Social History Social History   Tobacco Use  . Smoking status: Former Smoker    Packs/day: 1.00    Years: 30.00    Pack years: 30.00    Types: Cigarettes    Last attempt to quit: 02/03/1979    Years since quitting: 38.9  . Smokeless tobacco: Never Used  Substance Use Topics  . Alcohol use: Yes    Alcohol/week: 0.6 oz    Types: 1 Standard drinks or equivalent per week  . Drug use: Yes    Frequency: 7.0 times per week    Comment: 10 yrs. to current    Current Outpatient Medications  Medication Sig Dispense Refill  . albuterol (PROVENTIL HFA;VENTOLIN HFA) 108 (90 BASE) MCG/ACT inhaler Inhale 2 puffs into the lungs every 4 (four) hours as needed for wheezing or shortness of breath.    . ALPRAZolam (XANAX) 0.5 MG tablet Take 0.25-0.5 mg by  mouth daily as needed for anxiety.    Marland Kitchen atorvastatin (LIPITOR) 40 MG tablet Take 1 tablet (40 mg total) by mouth daily. 90 tablet 1  . budesonide-formoterol (SYMBICORT) 80-4.5 MCG/ACT inhaler Take 2 puffs first thing in am and then another 2 puffs about 12 hours later. 1 Inhaler 12  . clopidogrel (PLAVIX) 75 MG tablet Take 1 tablet (75 mg total) by mouth daily. 21 tablet 0  . dextromethorphan-guaiFENesin (MUCINEX DM) 30-600 MG per 12 hr tablet Take 1 tablet by mouth 2 (two) times daily as needed.     . DULoxetine (CYMBALTA) 30 MG capsule Take one cap daily at bedtime x 1 week then 2 caps daily at bedtime.    Marland Kitchen losartan (COZAAR) 50 MG tablet     . traMADol (ULTRAM) 50 MG tablet  Take 50-100 mg by mouth daily.     . travoprost, benzalkonium, (TRAVATAN) 0.004 % ophthalmic solution Place 1 drop into both eyes at bedtime.     No current facility-administered medications for this visit.     Allergies  Allergen Reactions  . Codeine     GI upset    Review of Systems  Constitutional: Positive for unexpected weight change (lodt 5 lbs last 3 months).  HENT: Positive for voice change. Negative for trouble swallowing.   Eyes: Positive for visual disturbance (With mini stroke-resolved).  Respiratory: Positive for cough, shortness of breath and wheezing.   Cardiovascular: Negative for chest pain and leg swelling.  Gastrointestinal: Negative for abdominal distention and abdominal pain.  Musculoskeletal: Positive for arthralgias and joint swelling.  Neurological: Positive for speech difficulty (With mini stroke- resolved). Negative for syncope, facial asymmetry and weakness.  Psychiatric/Behavioral: Positive for dysphoric mood. The patient is not nervous/anxious.   All other systems reviewed and are negative.   BP 137/80   Pulse 78   Resp 20   Ht 5' 1.5" (1.562 m)   Wt 149 lb (67.6 kg)   SpO2 95% Comment: RA  BMI 27.70 kg/m  Physical Exam  Constitutional: She is oriented to person, place, and time. She appears well-developed and well-nourished. No distress.  HENT:  Head: Normocephalic and atraumatic.  Mouth/Throat: No oropharyngeal exudate.  Eyes: Conjunctivae and EOM are normal. No scleral icterus.  Neck: Neck supple. No thyromegaly present.  Cardiovascular: Normal rate, regular rhythm and normal heart sounds. Exam reveals no gallop and no friction rub.  No murmur heard. Pulmonary/Chest: Effort normal. No respiratory distress. She has wheezes. She has rales.  Abdominal: Soft. She exhibits no distension. There is no tenderness.  Musculoskeletal: She exhibits no edema or deformity.  Lymphadenopathy:    She has no cervical adenopathy.  Neurological: She is alert  and oriented to person, place, and time. No cranial nerve deficit. She exhibits normal muscle tone.  Skin: Skin is warm and dry.  Vitals reviewed.    Diagnostic Tests: CT CHEST WITHOUT CONTRAST  TECHNIQUE: Multidetector CT imaging of the chest was performed following the standard protocol without intravenous contrast. High resolution imaging of the lungs, as well as inspiratory and expiratory imaging, was performed.  COMPARISON:  02/07/2017.  FINDINGS: Cardiovascular: Atherosclerotic calcification of the arterial vasculature, including coronary arteries. Heart size normal. No pericardial effusion.  Mediastinum/Nodes: Mediastinal lymph nodes are not enlarged by CT size criteria. Hilar regions are difficult to definitively evaluate without IV contrast but appear grossly unremarkable. No axillary adenopathy. Esophagus is grossly unremarkable.  Lungs/Pleura: Upper and midlung zone predominant interstitial coarsening, ground-glass, subpleural reticulation and traction bronchiectasis/bronchiolectasis. Findings appears stable  from 02/07/2017. 4 mm posterior segment right upper lobe nodule (image 40), unchanged. No pleural fluid. Airway is otherwise unremarkable. No air trapping.  Upper Abdomen: 18 mm low-attenuation lesion in the right hepatic lobe is unchanged and likely a cyst. Definitive characterization is limited without post-contrast imaging. Visualized portions of the liver, gallbladder, adrenal glands, kidneys, spleen pancreas, stomach and bowel are otherwise grossly unremarkable. No upper abdominal adenopathy.  Musculoskeletal: Degenerative changes in the spine. No worrisome lytic or sclerotic lesions.  IMPRESSION: 1. Pulmonary parenchymal pattern of fibrotic interstitial lung disease is unchanged from 02/07/2017 and may be due to nonspecific interstitial pneumonitis or mild chronic hypersensitivity pneumonitis. Findings are not consistent with usual  interstitial pneumonitis. 2. Aortic atherosclerosis (ICD10-170.0). Coronary artery calcification.   Electronically Signed   By: Lorin Picket M.D.   On: 11/14/2017 12:56 I personally reviewed the CT images and concur with the findings noted above  Pulmonary function testing 12/13/2017 FVC 1.65 (58%) FEV1 1.39 (64%) FEV1 1.76 (81%) postbronchodilator DLCO 14.35 (68%)  Impression: Sharon [name] is a 67 year old woman with a history of history of hypertension, hyperlipidemia, depression, degenerative disc disease, polyarthropathy, pneumonia, and a recent TIA.  She has had a persistent cough and wheezing over the past 4 months.  A CT of the chest shows interstitial lung disease.  She needs a surgical lung biopsy for diagnostic purposes.  I discussed the procedure with Sharon [name] and her husband.  They understand this is diagnostic and not therapeutic.  They understand it is a major operative procedure.  We would do a left VATS for lung biopsy.  I informed them of the general nature of the procedure, the need for general anesthesia, the incisions to be used, and the use of drains to postoperatively.  We discussed expected hospital stay and overall recovery.  I informed them of the indications, risks, benefits, and alternatives.  They understand the risk include, but not limited to death, MI, DVT, PE, bleeding, possible need for transfusion, infection, air leaks, cardiac arrhythmias, as well as possibility of other unforeseeable complications.  Dr. Chase Caller had discussed participating in the [place] study with her.  They understand that involves bronchoscopy with transbronchial biopsies.  He does not appreciably change the risk of the procedure but will lengthen the procedure.  She wishes to proceed with that as well.  I will need to check with Dr. Leonie Man regarding her recent TIA and her stroke risk with an operative procedure.  She will need to be off of Plavix for 5 days prior to the  procedure.  Plan: Bronchoscopy for biopsy for BRAVE study and left VATS for lung biopsy-timing to be determined.  Will hold Plavix for 5 days prior to surgery  Melrose Nakayama, MD [facility] [phone]

## 2018-01-03 ENCOUNTER — Other Ambulatory Visit: Payer: Self-pay | Admitting: *Deleted

## 2018-01-03 DIAGNOSIS — J849 Interstitial pulmonary disease, unspecified: Secondary | ICD-10-CM

## 2018-01-24 ENCOUNTER — Ambulatory Visit: Payer: Medicare Other | Admitting: Internal Medicine

## 2018-02-03 NOTE — Pre-Procedure Instructions (Signed)
Sharon [name]  02/03/2018      [name] [address] [place] [address] Phone: [phone] Fax: [phone]    Your procedure is scheduled on Wed. May 15  Report to [facility] Admitting at 6:30 A.M.  Call this number if you have problems the morning of surgery:  [phone]   Remember:  Do not eat food or drink liquids after midnight on Tues. May 14   Take these medicines the morning of surgery with A SIP OF WATER : albuterol inhaler-bring to hospital, alprazolam (xanax) if needed, symbicort-bring to hospital, duloxetine (cymbalta), tramadol if needed  7 days prior to surgery STOP taking  Aleve, Naproxen, Ibuprofen, Motrin, Advil, Goody's, BC's, all herbal medications, fish oil, and all vitamins             FOLLOW DR. Leonarda Salon INSTRUCTIONS WHEN TO STOP PLAVIX AND ASPIRIN.   Do not wear jewelry, make-up or nail polish.  Do not wear lotions, powders, or perfumes, or deodorant.  Do not shave 48 hours prior to surgery.  Men may shave face and neck.  Do not bring valuables to the hospital.  [facility] is not responsible for any belongings or valuables.  Contacts, dentures or bridgework may not be worn into surgery.  Leave your suitcase in the car.  After surgery it may be brought to your room.  For patients admitted to the hospital, discharge time will be determined by your treatment team.  Patients discharged the day of surgery will not be allowed to drive home.    Special instructions:  [place]- Preparing For Surgery  Before surgery, you can play an important role. Because skin is not sterile, your skin needs to be as free of germs as possible. You can reduce the number of germs on your skin by washing with CHG (chlorahexidine gluconate) Soap before surgery.  CHG is an antiseptic cleaner which kills germs and bonds with the skin to continue killing germs even after  washing.  Oral Hygiene is also important to reduce your risk of infection.  Remember - BRUSH YOUR TEETH THE MORNING OF SURGERY  Please do not use if you have an allergy to CHG or antibacterial soaps. If your skin becomes reddened/irritated stop using the CHG.  Do not shave (including legs and underarms) for at least 48 hours prior to first CHG shower. It is OK to shave your face.  Please follow these instructions carefully.   1. Shower the NIGHT BEFORE SURGERY and the MORNING OF SURGERY with CHG.   2. If you chose to wash your hair, wash your hair first as usual with your normal shampoo.  3. After you shampoo, rinse your hair and body thoroughly to remove the shampoo.  4. Use CHG as you would any other liquid soap. You can apply CHG directly to the skin and wash gently with a scrungie or a clean washcloth.   5. Apply the CHG Soap to your body ONLY FROM THE NECK DOWN.  Do not use on open wounds or open sores. Avoid contact with your eyes, ears, mouth and genitals (private parts). Wash Face and genitals (private parts)  with your normal soap.  6. Wash thoroughly, paying special attention to the area where your surgery will be performed.  7. Thoroughly rinse your body with warm water from the neck down.  8. DO NOT shower/wash with your normal soap after using  and rinsing off the CHG Soap.  9. Pat yourself dry with a CLEAN TOWEL.  10. Wear CLEAN PAJAMAS to bed the night before surgery, wear comfortable clothes the morning of surgery  11. Place CLEAN SHEETS on your bed the night of your first shower and DO NOT SLEEP WITH PETS.    Day of Surgery: Do not apply any deodorants/lotions. Please wear clean clothes to the hospital/surgery center.  Remember to brush your teeth.      Please read over the following fact sheets that you were given. Coughing and Deep Breathing, MRSA Information and Surgical Site Infection Prevention

## 2018-02-06 ENCOUNTER — Emergency Department (HOSPITAL_COMMUNITY): Payer: Medicare Other

## 2018-02-06 ENCOUNTER — Inpatient Hospital Stay (HOSPITAL_COMMUNITY)
Admission: RE | Admit: 2018-02-06 | Discharge: 2018-02-06 | Disposition: A | Discharge status: Home / Self Care | Payer: Medicare Other | Source: Ambulatory Visit

## 2018-02-06 ENCOUNTER — Other Ambulatory Visit: Payer: Self-pay

## 2018-02-06 ENCOUNTER — Encounter (HOSPITAL_COMMUNITY): Payer: Self-pay | Admitting: Emergency Medicine

## 2018-02-06 ENCOUNTER — Emergency Department (HOSPITAL_COMMUNITY)
Admission: EM | Admit: 2018-02-06 | Discharge: 2018-02-06 | Disposition: A | Discharge status: Home / Self Care | Payer: Medicare Other | Source: Home / Self Care

## 2018-02-06 DIAGNOSIS — Z5321 Procedure and treatment not carried out due to patient leaving prior to being seen by health care provider: Secondary | ICD-10-CM

## 2018-02-06 DIAGNOSIS — J849 Interstitial pulmonary disease, unspecified: Secondary | ICD-10-CM | POA: Diagnosis not present

## 2018-02-06 DIAGNOSIS — R0602 Shortness of breath: Secondary | ICD-10-CM

## 2018-02-06 DIAGNOSIS — R05 Cough: Secondary | ICD-10-CM

## 2018-02-06 MED ORDER — ALBUTEROL SULFATE (2.5 MG/3ML) 0.083% IN NEBU
5.0000 mg | INHALATION_SOLUTION | Freq: Once | RESPIRATORY_TRACT | Status: AC
  Administered 2018-02-06: 5 mg via RESPIRATORY_TRACT
  Filled 2018-02-06: qty 6

## 2018-02-06 NOTE — ED Triage Notes (Signed)
Pt c/o cough that "is little mucousy" since yesterday. C/o SOB and having hard time breathing this morning. Reports supposed to be having lung biopsy on Wed.

## 2018-02-06 NOTE — ED Notes (Signed)
Patient gave stickers to registration and made this RN aware.

## 2018-02-06 NOTE — Progress Notes (Signed)
Pt not here for PAT appointment called her and she began coughing, her husband states they are on the way to urgent care. States that she is having trouble breathing and needs to be seen today. Instructed her husband we would call her back later to see what needs to be done.

## 2018-02-07 ENCOUNTER — Other Ambulatory Visit: Payer: Self-pay

## 2018-02-07 ENCOUNTER — Encounter (HOSPITAL_COMMUNITY): Payer: Self-pay

## 2018-02-07 ENCOUNTER — Encounter (HOSPITAL_COMMUNITY)
Admission: RE | Admit: 2018-02-07 | Discharge: 2018-02-07 | Disposition: A | Discharge status: Home / Self Care | Payer: Medicare Other | Source: Ambulatory Visit | Attending: Thoracic Surgery (Cardiothoracic Vascular Surgery) | Admitting: Thoracic Surgery (Cardiothoracic Vascular Surgery)

## 2018-02-07 ENCOUNTER — Other Ambulatory Visit (HOSPITAL_COMMUNITY): Payer: Self-pay | Admitting: *Deleted

## 2018-02-07 DIAGNOSIS — J849 Interstitial pulmonary disease, unspecified: Secondary | ICD-10-CM

## 2018-02-07 HISTORY — DX: Cerebral infarction, unspecified: I63.9

## 2018-02-07 HISTORY — DX: Interstitial pulmonary disease, unspecified: J84.9

## 2018-02-07 HISTORY — DX: Anemia, unspecified: D64.9

## 2018-02-07 HISTORY — DX: Dyspnea, unspecified: R06.00

## 2018-02-07 LAB — URINALYSIS, ROUTINE W REFLEX MICROSCOPIC
Bacteria, UA: NONE SEEN
GLUCOSE, UA: NEGATIVE mg/dL
Ketones, ur: 5 mg/dL — AB
Nitrite: NEGATIVE
PH: 5 (ref 5.0–8.0)
Protein, ur: 30 mg/dL — AB
Specific Gravity, Urine: 1.025 (ref 1.005–1.030)

## 2018-02-07 LAB — CBC
HCT: 42.2 % (ref 36.0–46.0)
Hemoglobin: 13.6 g/dL (ref 12.0–15.0)
MCH: 26.5 pg (ref 26.0–34.0)
MCHC: 32.2 g/dL (ref 30.0–36.0)
MCV: 82.3 fL (ref 78.0–100.0)
PLATELETS: 336 10*3/uL (ref 150–400)
RBC: 5.13 MIL/uL — AB (ref 3.87–5.11)
RDW: 14.3 % (ref 11.5–15.5)
WBC: 10.6 10*3/uL — AB (ref 4.0–10.5)

## 2018-02-07 LAB — BLOOD GAS, ARTERIAL
ACID-BASE EXCESS: 0.3 mmol/L (ref 0.0–2.0)
Bicarbonate: 23.8 mmol/L (ref 20.0–28.0)
DRAWN BY: 470591
FIO2: 21
O2 SAT: 96.4 %
PATIENT TEMPERATURE: 98.6
PCO2 ART: 34.6 mmHg (ref 32.0–48.0)
pH, Arterial: 7.452 — ABNORMAL HIGH (ref 7.350–7.450)
pO2, Arterial: 81.5 mmHg — ABNORMAL LOW (ref 83.0–108.0)

## 2018-02-07 LAB — ABO/RH: ABO/RH(D): O POS

## 2018-02-07 LAB — COMPREHENSIVE METABOLIC PANEL
ALK PHOS: 79 U/L (ref 38–126)
ALT: 27 U/L (ref 14–54)
AST: 30 U/L (ref 15–41)
Albumin: 3.9 g/dL (ref 3.5–5.0)
Anion gap: 13 (ref 5–15)
BUN: 9 mg/dL (ref 6–20)
CALCIUM: 9.4 mg/dL (ref 8.9–10.3)
CO2: 19 mmol/L — AB (ref 22–32)
CREATININE: 0.86 mg/dL (ref 0.44–1.00)
Chloride: 102 mmol/L (ref 101–111)
Glucose, Bld: 115 mg/dL — ABNORMAL HIGH (ref 65–99)
Potassium: 4.7 mmol/L (ref 3.5–5.1)
Sodium: 134 mmol/L — ABNORMAL LOW (ref 135–145)
Total Bilirubin: 0.6 mg/dL (ref 0.3–1.2)
Total Protein: 7.6 g/dL (ref 6.5–8.1)

## 2018-02-07 LAB — SURGICAL PCR SCREEN
MRSA, PCR: NEGATIVE
STAPHYLOCOCCUS AUREUS: NEGATIVE

## 2018-02-07 LAB — PROTIME-INR
INR: 1.11
Prothrombin Time: 14.2 seconds (ref 11.4–15.2)

## 2018-02-07 LAB — APTT: aPTT: 32 seconds (ref 24–36)

## 2018-02-07 LAB — TYPE AND SCREEN
ABO/RH(D): O POS
Antibody Screen: NEGATIVE

## 2018-02-07 NOTE — Pre-Procedure Instructions (Addendum)
CHARISMA CHARLOT  02/07/2018    Your procedure is scheduled on Wednesday, Feb 08, 2018 at 8:30 AM.   Report to [facility] Entrance "A" Admitting Office at 6:30 AM.    Call this number if you have problems the morning of surgery: [phone]   Remember:  Do not eat food or drink liquids after midnight tonight.             Take these medicines the morning of surgery with A SIP OF WATER: Duloxetine (Cymbalta), Tramadol (Ultram), Alprazolam (Xanax) - if needed, Symbicort inhaler, Albuterol (Proventil) inhaler - if needed (bring this inhaler with you in the AM)   Do not wear jewelry, make-up or nail polish.  Do not wear lotions, powders, perfumes or deodorant.  Do not shave 48 hours prior to surgery.    Do not bring valuables to the hospital.  [facility] is not responsible for any belongings or valuables.  Contacts, dentures or bridgework may not be worn into surgery.  Leave your suitcase in the car.  After surgery it may be brought to your room.  For patients admitted to the hospital, discharge time will be determined by your treatment team.  [facility] - Preparing for Surgery  Before surgery, you can play an important role.  Because skin is not sterile, your skin needs to be as free of germs as possible.  You can reduce the number of germs on you skin by washing with CHG (chlorahexidine gluconate) soap before surgery.  CHG is an antiseptic cleaner which kills germs and bonds with the skin to continue killing germs even after washing.  Oral Hygiene is also important in reducing the risk of infection.  Remember to brush your teeth the morning of surgery.  Please DO NOT use if you have an allergy to CHG or antibacterial soaps.  If your skin becomes reddened/irritated stop using the CHG and inform your nurse when you arrive at Short Stay.  Do not shave (including legs and underarms) for at least 48 hours prior to the first CHG shower.  You may shave your face.  Please follow  these instructions carefully:   1.  Shower with CHG Soap the night before surgery and the morning of Surgery.  2.  If you choose to wash your hair, wash your hair first as usual with your normal shampoo.  3.  After you shampoo, rinse your hair and body thoroughly to remove the shampoo. 4.  Use CHG as you would any other liquid soap.  You can apply chg directly to the skin and wash gently with a      scrungie or washcloth.           5.  Apply the CHG Soap to your body ONLY FROM THE NECK DOWN.   Do not use on open wounds or open sores. Avoid contact with your eyes, ears, mouth and genitals (private parts).  Wash genitals (private parts) with your normal soap.  6.  Wash thoroughly, paying special attention to the area where your surgery will be performed.  7.  Thoroughly rinse your body with warm water from the neck down.  8.  DO NOT shower/wash with your normal soap after using and rinsing off the CHG Soap.  9.  Pat yourself dry with a clean towel.            10.  Wear clean pajamas.            11.  Place clean  sheets on your bed the night of your first shower and do not sleep with pets.  Day of Surgery  Do not apply any lotions/deoderants the morning of surgery.   Please wear clean clothes to the hospital. Remember to brush your teeth.   Please read over the fact sheets that you were given.

## 2018-02-07 NOTE — Progress Notes (Signed)
Pt denies cardiac history or diabetes. Pt has interstitial lung disease. Pt has congested cough. States she has not had a fever. C/o pain with the cough. Pt went to the ED yesterday, had EKG and CXR done but left prior to seeing physician (she states she had waited 4 and 1/2 hours). Dr. Roxan Hockey is aware of the cough per pt.

## 2018-02-08 ENCOUNTER — Inpatient Hospital Stay (HOSPITAL_COMMUNITY): Payer: Medicare Other

## 2018-02-08 ENCOUNTER — Inpatient Hospital Stay (HOSPITAL_COMMUNITY): Payer: Medicare Other | Admitting: Certified Registered"

## 2018-02-08 ENCOUNTER — Inpatient Hospital Stay (HOSPITAL_COMMUNITY)
Admission: RE | Admit: 2018-02-08 | Discharge: 2018-02-11 | DRG: 168 | Disposition: A | Discharge status: Home / Self Care | Payer: Medicare Other | Source: Ambulatory Visit | Attending: Thoracic Surgery (Cardiothoracic Vascular Surgery) | Admitting: Thoracic Surgery (Cardiothoracic Vascular Surgery)

## 2018-02-08 ENCOUNTER — Other Ambulatory Visit: Payer: Self-pay

## 2018-02-08 ENCOUNTER — Encounter (HOSPITAL_COMMUNITY): Payer: Self-pay | Admitting: Certified Registered"

## 2018-02-08 ENCOUNTER — Encounter (HOSPITAL_COMMUNITY)
Admission: RE | Disposition: A | Discharge status: Home / Self Care | Payer: Self-pay | Source: Ambulatory Visit | Attending: Thoracic Surgery (Cardiothoracic Vascular Surgery)

## 2018-02-08 DIAGNOSIS — Z8673 Personal history of transient ischemic attack (TIA), and cerebral infarction without residual deficits: Secondary | ICD-10-CM

## 2018-02-08 DIAGNOSIS — Z885 Allergy status to narcotic agent status: Secondary | ICD-10-CM

## 2018-02-08 DIAGNOSIS — I10 Essential (primary) hypertension: Secondary | ICD-10-CM | POA: Diagnosis present

## 2018-02-08 DIAGNOSIS — F329 Major depressive disorder, single episode, unspecified: Secondary | ICD-10-CM | POA: Diagnosis present

## 2018-02-08 DIAGNOSIS — J849 Interstitial pulmonary disease, unspecified: Secondary | ICD-10-CM | POA: Diagnosis present

## 2018-02-08 DIAGNOSIS — Z4682 Encounter for fitting and adjustment of non-vascular catheter: Secondary | ICD-10-CM

## 2018-02-08 DIAGNOSIS — M130 Polyarthritis, unspecified: Secondary | ICD-10-CM | POA: Diagnosis present

## 2018-02-08 DIAGNOSIS — R110 Nausea: Secondary | ICD-10-CM | POA: Diagnosis not present

## 2018-02-08 DIAGNOSIS — Z825 Family history of asthma and other chronic lower respiratory diseases: Secondary | ICD-10-CM | POA: Diagnosis not present

## 2018-02-08 DIAGNOSIS — Z79899 Other long term (current) drug therapy: Secondary | ICD-10-CM

## 2018-02-08 DIAGNOSIS — E559 Vitamin D deficiency, unspecified: Secondary | ICD-10-CM | POA: Diagnosis present

## 2018-02-08 DIAGNOSIS — Z87891 Personal history of nicotine dependence: Secondary | ICD-10-CM

## 2018-02-08 DIAGNOSIS — Z79891 Long term (current) use of opiate analgesic: Secondary | ICD-10-CM | POA: Diagnosis not present

## 2018-02-08 DIAGNOSIS — Z823 Family history of stroke: Secondary | ICD-10-CM | POA: Diagnosis not present

## 2018-02-08 DIAGNOSIS — Z7902 Long term (current) use of antithrombotics/antiplatelets: Secondary | ICD-10-CM | POA: Diagnosis not present

## 2018-02-08 DIAGNOSIS — Z7951 Long term (current) use of inhaled steroids: Secondary | ICD-10-CM | POA: Diagnosis not present

## 2018-02-08 DIAGNOSIS — Z8249 Family history of ischemic heart disease and other diseases of the circulatory system: Secondary | ICD-10-CM

## 2018-02-08 DIAGNOSIS — E782 Mixed hyperlipidemia: Secondary | ICD-10-CM | POA: Diagnosis present

## 2018-02-08 DIAGNOSIS — M5030 Other cervical disc degeneration, unspecified cervical region: Secondary | ICD-10-CM | POA: Diagnosis present

## 2018-02-08 DIAGNOSIS — H269 Unspecified cataract: Secondary | ICD-10-CM | POA: Diagnosis present

## 2018-02-08 HISTORY — PX: VIDEO BRONCHOSCOPY: SHX5072

## 2018-02-08 HISTORY — PX: VIDEO ASSISTED THORACOSCOPY: SHX5073

## 2018-02-08 HISTORY — PX: LUNG BIOPSY: SHX5088

## 2018-02-08 SURGERY — BRONCHOSCOPY, VIDEO-ASSISTED
Anesthesia: General | Site: Chest

## 2018-02-08 MED ORDER — FENTANYL 40 MCG/ML IV SOLN
INTRAVENOUS | Status: DC
  Administered 2018-02-08: 110 ug via INTRAVENOUS
  Administered 2018-02-08: 130 ug via INTRAVENOUS
  Administered 2018-02-08: 1000 ug via INTRAVENOUS
  Administered 2018-02-08: 40 ug via INTRAVENOUS
  Administered 2018-02-09: 70 ug via INTRAVENOUS
  Administered 2018-02-09: 40 ug via INTRAVENOUS
  Administered 2018-02-09: 70 ug via INTRAVENOUS
  Administered 2018-02-09: 90 ug via INTRAVENOUS
  Administered 2018-02-09: 40 ug via INTRAVENOUS
  Administered 2018-02-10: 10 ug via INTRAVENOUS
  Administered 2018-02-10: 40 ug via INTRAVENOUS
  Filled 2018-02-08: qty 25

## 2018-02-08 MED ORDER — ATORVASTATIN CALCIUM 40 MG PO TABS
40.0000 mg | ORAL_TABLET | Freq: Every day | ORAL | Status: DC
  Administered 2018-02-08 – 2018-02-11 (×4): 40 mg via ORAL
  Filled 2018-02-08 (×4): qty 1

## 2018-02-08 MED ORDER — DEXAMETHASONE SODIUM PHOSPHATE 10 MG/ML IJ SOLN
INTRAMUSCULAR | Status: DC | PRN
  Administered 2018-02-08: 10 mg via INTRAVENOUS

## 2018-02-08 MED ORDER — LATANOPROST 0.005 % OP SOLN
1.0000 [drp] | Freq: Every day | OPHTHALMIC | Status: DC
  Administered 2018-02-08 – 2018-02-10 (×3): 1 [drp] via OPHTHALMIC
  Filled 2018-02-08: qty 2.5

## 2018-02-08 MED ORDER — EPHEDRINE SULFATE 50 MG/ML IJ SOLN
INTRAMUSCULAR | Status: DC | PRN
  Administered 2018-02-08 (×2): 10 mg via INTRAVENOUS

## 2018-02-08 MED ORDER — DEXAMETHASONE SODIUM PHOSPHATE 10 MG/ML IJ SOLN
INTRAMUSCULAR | Status: AC
  Filled 2018-02-08: qty 1

## 2018-02-08 MED ORDER — ENOXAPARIN SODIUM 30 MG/0.3ML ~~LOC~~ SOLN
30.0000 mg | Freq: Two times a day (BID) | SUBCUTANEOUS | Status: DC
  Administered 2018-02-09 – 2018-02-11 (×5): 30 mg via SUBCUTANEOUS
  Filled 2018-02-08 (×5): qty 0.3

## 2018-02-08 MED ORDER — SENNOSIDES-DOCUSATE SODIUM 8.6-50 MG PO TABS
1.0000 | ORAL_TABLET | Freq: Every day | ORAL | Status: DC
  Administered 2018-02-08 – 2018-02-10 (×3): 1 via ORAL
  Filled 2018-02-08 (×3): qty 1

## 2018-02-08 MED ORDER — EPINEPHRINE PF 1 MG/ML IJ SOLN
INTRAMUSCULAR | Status: DC | PRN
  Administered 2018-02-08: 65 mL

## 2018-02-08 MED ORDER — ACETAMINOPHEN 500 MG PO TABS
1000.0000 mg | ORAL_TABLET | Freq: Four times a day (QID) | ORAL | Status: DC
  Administered 2018-02-08 – 2018-02-11 (×11): 1000 mg via ORAL
  Filled 2018-02-08 (×11): qty 2

## 2018-02-08 MED ORDER — ONDANSETRON HCL 4 MG/2ML IJ SOLN: 4.0000 mg | Freq: Once | INTRAMUSCULAR | Status: DC | PRN

## 2018-02-08 MED ORDER — PROPOFOL 10 MG/ML IV BOLUS
INTRAVENOUS | Status: AC
  Filled 2018-02-08: qty 20

## 2018-02-08 MED ORDER — HYDROMORPHONE HCL 2 MG/ML IJ SOLN
0.2500 mg | INTRAMUSCULAR | Status: DC | PRN
  Administered 2018-02-08: 0.5 mg via INTRAVENOUS

## 2018-02-08 MED ORDER — METOCLOPRAMIDE HCL 5 MG/ML IJ SOLN
10.0000 mg | Freq: Four times a day (QID) | INTRAMUSCULAR | Status: AC
  Administered 2018-02-08: 10 mg via INTRAVENOUS
  Filled 2018-02-08: qty 2

## 2018-02-08 MED ORDER — SUFENTANIL CITRATE 50 MCG/ML IV SOLN
INTRAVENOUS | Status: AC
  Filled 2018-02-08: qty 1

## 2018-02-08 MED ORDER — CEFAZOLIN SODIUM-DEXTROSE 2-4 GM/100ML-% IV SOLN
2.0000 g | Freq: Three times a day (TID) | INTRAVENOUS | Status: AC
  Administered 2018-02-08 – 2018-02-09 (×2): 2 g via INTRAVENOUS
  Filled 2018-02-08 (×2): qty 100

## 2018-02-08 MED ORDER — SODIUM CHLORIDE 0.9 % IV SOLN
INTRAVENOUS | Status: DC
  Administered 2018-02-08: 12:00:00 via INTRAVENOUS

## 2018-02-08 MED ORDER — ALBUTEROL SULFATE (2.5 MG/3ML) 0.083% IN NEBU: 2.5000 mg | INHALATION_SOLUTION | RESPIRATORY_TRACT | Status: DC | PRN

## 2018-02-08 MED ORDER — BUPIVACAINE LIPOSOME 1.3 % IJ SUSP
20.0000 mL | INTRAMUSCULAR | Status: AC
  Administered 2018-02-08: 20 mL
  Filled 2018-02-08: qty 20

## 2018-02-08 MED ORDER — CEFAZOLIN SODIUM-DEXTROSE 2-4 GM/100ML-% IV SOLN
2.0000 g | INTRAVENOUS | Status: AC
  Administered 2018-02-08: 2 g via INTRAVENOUS
  Filled 2018-02-08: qty 100

## 2018-02-08 MED ORDER — DULOXETINE HCL 60 MG PO CPEP
60.0000 mg | ORAL_CAPSULE | Freq: Every day | ORAL | Status: DC
  Administered 2018-02-09 – 2018-02-11 (×3): 60 mg via ORAL
  Filled 2018-02-08 (×3): qty 1

## 2018-02-08 MED ORDER — BISACODYL 5 MG PO TBEC
10.0000 mg | DELAYED_RELEASE_TABLET | Freq: Every day | ORAL | Status: DC
  Administered 2018-02-08 – 2018-02-09 (×2): 10 mg via ORAL
  Filled 2018-02-08 (×2): qty 2

## 2018-02-08 MED ORDER — SODIUM CHLORIDE 0.9% FLUSH: 9.0000 mL | INTRAVENOUS | Status: DC | PRN

## 2018-02-08 MED ORDER — MEPERIDINE HCL 50 MG/ML IJ SOLN: 6.2500 mg | INTRAMUSCULAR | Status: DC | PRN

## 2018-02-08 MED ORDER — PROPOFOL 10 MG/ML IV BOLUS
INTRAVENOUS | Status: DC | PRN
  Administered 2018-02-08: 40 mg via INTRAVENOUS
  Administered 2018-02-08: 80 mg via INTRAVENOUS

## 2018-02-08 MED ORDER — NALOXONE HCL 0.4 MG/ML IJ SOLN: 0.4000 mg | INTRAMUSCULAR | Status: DC | PRN

## 2018-02-08 MED ORDER — EPINEPHRINE PF 1 MG/ML IJ SOLN
INTRAMUSCULAR | Status: AC
  Filled 2018-02-08: qty 2

## 2018-02-08 MED ORDER — SODIUM CHLORIDE 0.9 % IJ SOLN
INTRAMUSCULAR | Status: AC
  Filled 2018-02-08: qty 10

## 2018-02-08 MED ORDER — SUGAMMADEX SODIUM 200 MG/2ML IV SOLN
INTRAVENOUS | Status: DC | PRN
  Administered 2018-02-08: 100 mg via INTRAVENOUS

## 2018-02-08 MED ORDER — DIPHENHYDRAMINE HCL 12.5 MG/5ML PO ELIX
12.5000 mg | ORAL_SOLUTION | Freq: Four times a day (QID) | ORAL | Status: DC | PRN
  Filled 2018-02-08: qty 5

## 2018-02-08 MED ORDER — ASPIRIN EC 81 MG PO TBEC
81.0000 mg | DELAYED_RELEASE_TABLET | Freq: Every day | ORAL | Status: DC
  Administered 2018-02-08 – 2018-02-11 (×4): 81 mg via ORAL
  Filled 2018-02-08 (×4): qty 1

## 2018-02-08 MED ORDER — LOSARTAN POTASSIUM 50 MG PO TABS
50.0000 mg | ORAL_TABLET | Freq: Every day | ORAL | Status: DC
  Administered 2018-02-09 – 2018-02-11 (×3): 50 mg via ORAL
  Filled 2018-02-08 (×3): qty 1

## 2018-02-08 MED ORDER — ACETAMINOPHEN 160 MG/5ML PO SOLN: 1000.0000 mg | Freq: Four times a day (QID) | ORAL | Status: DC

## 2018-02-08 MED ORDER — TRAMADOL HCL 50 MG PO TABS: 50.0000 mg | ORAL_TABLET | Freq: Four times a day (QID) | ORAL | Status: DC | PRN

## 2018-02-08 MED ORDER — PHENYLEPHRINE 40 MCG/ML (10ML) SYRINGE FOR IV PUSH (FOR BLOOD PRESSURE SUPPORT)
PREFILLED_SYRINGE | INTRAVENOUS | Status: DC | PRN
  Administered 2018-02-08 (×10): 80 ug via INTRAVENOUS

## 2018-02-08 MED ORDER — CLOPIDOGREL BISULFATE 75 MG PO TABS: 75.0000 mg | ORAL_TABLET | Freq: Every day | ORAL | Status: DC

## 2018-02-08 MED ORDER — HYDROMORPHONE HCL 2 MG/ML IJ SOLN
INTRAMUSCULAR | Status: AC
  Filled 2018-02-08: qty 1

## 2018-02-08 MED ORDER — 0.9 % SODIUM CHLORIDE (POUR BTL) OPTIME
TOPICAL | Status: DC | PRN
  Administered 2018-02-08: 2000 mL

## 2018-02-08 MED ORDER — LACTATED RINGERS IV SOLN
INTRAVENOUS | Status: DC | PRN
  Administered 2018-02-08 (×2): via INTRAVENOUS

## 2018-02-08 MED ORDER — SUFENTANIL CITRATE 50 MCG/ML IV SOLN
INTRAVENOUS | Status: DC | PRN
  Administered 2018-02-08 (×2): 10 ug via INTRAVENOUS
  Administered 2018-02-08 (×3): 5 ug via INTRAVENOUS
  Administered 2018-02-08: 10 ug via INTRAVENOUS
  Administered 2018-02-08: 5 ug via INTRAVENOUS

## 2018-02-08 MED ORDER — MIDAZOLAM HCL 2 MG/2ML IJ SOLN
INTRAMUSCULAR | Status: AC
  Filled 2018-02-08: qty 2

## 2018-02-08 MED ORDER — EPHEDRINE SULFATE 50 MG/ML IJ SOLN
INTRAMUSCULAR | Status: AC
  Filled 2018-02-08: qty 1

## 2018-02-08 MED ORDER — DIPHENHYDRAMINE HCL 50 MG/ML IJ SOLN: 12.5000 mg | Freq: Four times a day (QID) | INTRAMUSCULAR | Status: DC | PRN

## 2018-02-08 MED ORDER — LIDOCAINE 2% (20 MG/ML) 5 ML SYRINGE
INTRAMUSCULAR | Status: AC
Start: 2018-02-08 — End: ?
  Filled 2018-02-08: qty 5

## 2018-02-08 MED ORDER — ONDANSETRON HCL 4 MG/2ML IJ SOLN
INTRAMUSCULAR | Status: DC | PRN
  Administered 2018-02-08: 4 mg via INTRAVENOUS

## 2018-02-08 MED ORDER — PHENYLEPHRINE 40 MCG/ML (10ML) SYRINGE FOR IV PUSH (FOR BLOOD PRESSURE SUPPORT)
PREFILLED_SYRINGE | INTRAVENOUS | Status: AC
Start: 2018-02-08 — End: ?
  Filled 2018-02-08: qty 10

## 2018-02-08 MED ORDER — OXYCODONE HCL 5 MG PO TABS: 5.0000 mg | ORAL_TABLET | ORAL | Status: DC | PRN

## 2018-02-08 MED ORDER — ONDANSETRON HCL 4 MG/2ML IJ SOLN
4.0000 mg | Freq: Four times a day (QID) | INTRAMUSCULAR | Status: DC | PRN
  Administered 2018-02-08: 4 mg via INTRAVENOUS

## 2018-02-08 MED ORDER — ONDANSETRON HCL 4 MG/2ML IJ SOLN
INTRAMUSCULAR | Status: AC
  Filled 2018-02-08: qty 2

## 2018-02-08 MED ORDER — LIDOCAINE 2% (20 MG/ML) 5 ML SYRINGE
INTRAMUSCULAR | Status: DC | PRN
  Administered 2018-02-08: 80 mg via INTRAVENOUS

## 2018-02-08 MED ORDER — DICLOFENAC SODIUM 75 MG PO TBEC
75.0000 mg | DELAYED_RELEASE_TABLET | Freq: Every day | ORAL | Status: DC
  Administered 2018-02-08 – 2018-02-11 (×4): 75 mg via ORAL
  Filled 2018-02-08 (×5): qty 1

## 2018-02-08 MED ORDER — POTASSIUM CHLORIDE 10 MEQ/50ML IV SOLN: 10.0000 meq | Freq: Every day | INTRAVENOUS | Status: DC | PRN

## 2018-02-08 MED ORDER — ALPRAZOLAM 0.5 MG PO TABS
0.5000 mg | ORAL_TABLET | Freq: Every day | ORAL | Status: DC | PRN
  Administered 2018-02-09 – 2018-02-10 (×3): 0.5 mg via ORAL
  Filled 2018-02-08 (×3): qty 1

## 2018-02-08 MED ORDER — SUGAMMADEX SODIUM 200 MG/2ML IV SOLN
INTRAVENOUS | Status: AC
  Filled 2018-02-08: qty 2

## 2018-02-08 MED ORDER — ROCURONIUM BROMIDE 50 MG/5ML IV SOLN
INTRAVENOUS | Status: AC
  Filled 2018-02-08: qty 1

## 2018-02-08 MED ORDER — MIDAZOLAM HCL 5 MG/5ML IJ SOLN
INTRAMUSCULAR | Status: DC | PRN
  Administered 2018-02-08 (×2): 1 mg via INTRAVENOUS

## 2018-02-08 MED ORDER — ROCURONIUM BROMIDE 100 MG/10ML IV SOLN
INTRAVENOUS | Status: DC | PRN
  Administered 2018-02-08: 50 mg via INTRAVENOUS

## 2018-02-08 MED ORDER — ORAL CARE MOUTH RINSE
15.0000 mL | Freq: Two times a day (BID) | OROMUCOSAL | Status: DC
  Administered 2018-02-08: 15 mL via OROMUCOSAL

## 2018-02-08 MED ORDER — MOMETASONE FURO-FORMOTEROL FUM 100-5 MCG/ACT IN AERO
2.0000 | INHALATION_SPRAY | Freq: Two times a day (BID) | RESPIRATORY_TRACT | Status: DC
  Administered 2018-02-08 – 2018-02-11 (×6): 2 via RESPIRATORY_TRACT
  Filled 2018-02-08: qty 8.8

## 2018-02-08 MED ORDER — ONDANSETRON HCL 4 MG/2ML IJ SOLN
4.0000 mg | Freq: Four times a day (QID) | INTRAMUSCULAR | Status: DC | PRN
  Administered 2018-02-10 – 2018-02-11 (×2): 4 mg via INTRAVENOUS
  Filled 2018-02-08 (×2): qty 2

## 2018-02-08 MED ORDER — LUNG SURGERY BOOK
Freq: Once | Status: AC
  Administered 2018-02-08: 23:00:00
  Filled 2018-02-08 (×2): qty 1

## 2018-02-08 SURGICAL SUPPLY — 103 items
ADH SKN CLS APL DERMABOND .7 (GAUZE/BANDAGES/DRESSINGS) ×2
APPLIER CLIP ROT 10 11.4 M/L (STAPLE)
APR CLP MED LRG 11.4X10 (STAPLE)
BAG SPEC RTRVL LRG 6X4 10 (ENDOMECHANICALS)
BRUSH CYTOL CELLEBRITY 1.5X140 (MISCELLANEOUS) IMPLANT
CANISTER SUCT 3000ML PPV (MISCELLANEOUS) ×4 IMPLANT
CATH KIT ON Q 5IN SLV (PAIN MANAGEMENT) IMPLANT
CATH THORACIC 28FR (CATHETERS) ×1 IMPLANT
CATH THORACIC 28FR RT ANG (CATHETERS) IMPLANT
CATH THORACIC 36FR (CATHETERS) IMPLANT
CATH THORACIC 36FR RT ANG (CATHETERS) IMPLANT
CLIP APPLIE ROT 10 11.4 M/L (STAPLE) IMPLANT
CLIP VESOCCLUDE MED 6/CT (CLIP) ×1 IMPLANT
CONN Y 3/8X3/8X3/8  BEN (MISCELLANEOUS) ×1
CONN Y 3/8X3/8X3/8 BEN (MISCELLANEOUS) ×2 IMPLANT
CONT SPEC 4OZ CLIKSEAL STRL BL (MISCELLANEOUS) ×11 IMPLANT
COVER BACK TABLE 60X90IN (DRAPES) ×3 IMPLANT
COVER SURGICAL LIGHT HANDLE (MISCELLANEOUS) ×2 IMPLANT
CUTTER ECHEON FLEX ENDO 45 340 (ENDOMECHANICALS) ×1 IMPLANT
DERMABOND ADVANCED (GAUZE/BANDAGES/DRESSINGS) ×1
DERMABOND ADVANCED .7 DNX12 (GAUZE/BANDAGES/DRESSINGS) IMPLANT
DRAIN CHANNEL 28F RND 3/8 FF (WOUND CARE) IMPLANT
DRAIN CHANNEL 32F RND 10.7 FF (WOUND CARE) IMPLANT
DRAPE LAPAROSCOPIC ABDOMINAL (DRAPES) ×3 IMPLANT
DRAPE SLUSH/WARMER DISC (DRAPES) ×3 IMPLANT
ELECT BLADE 6.5 EXT (BLADE) ×1 IMPLANT
ELECT REM PT RETURN 9FT ADLT (ELECTROSURGICAL) ×3
ELECTRODE REM PT RTRN 9FT ADLT (ELECTROSURGICAL) ×2 IMPLANT
FILTER STRAW FLUID ASPIR (MISCELLANEOUS) ×1 IMPLANT
FORCEPS BIOP RJ4 1.8 (CUTTING FORCEPS) IMPLANT
FORCEPS RADIAL JAW LRG 4 PULM (INSTRUMENTS) IMPLANT
GAUZE SPONGE 4X4 12PLY STRL (GAUZE/BANDAGES/DRESSINGS) ×4 IMPLANT
GAUZE SPONGE 4X4 12PLY STRL LF (GAUZE/BANDAGES/DRESSINGS) ×1 IMPLANT
GLOVE BIO SURGEON STRL SZ 6.5 (GLOVE) ×4 IMPLANT
GLOVE BIOGEL PI IND STRL 6 (GLOVE) IMPLANT
GLOVE BIOGEL PI IND STRL 6.5 (GLOVE) IMPLANT
GLOVE BIOGEL PI INDICATOR 6 (GLOVE) ×4
GLOVE BIOGEL PI INDICATOR 6.5 (GLOVE) ×4
GLOVE SURG SIGNA 7.5 PF LTX (GLOVE) ×9 IMPLANT
GOWN STRL REUS W/ TWL LRG LVL3 (GOWN DISPOSABLE) ×4 IMPLANT
GOWN STRL REUS W/ TWL XL LVL3 (GOWN DISPOSABLE) ×2 IMPLANT
GOWN STRL REUS W/TWL LRG LVL3 (GOWN DISPOSABLE) ×12
GOWN STRL REUS W/TWL XL LVL3 (GOWN DISPOSABLE) ×6
HEMOSTAT SURGICEL 2X14 (HEMOSTASIS) IMPLANT
IV CATH 22GX1 FEP (IV SOLUTION) IMPLANT
KIT BASIN OR (CUSTOM PROCEDURE TRAY) ×3 IMPLANT
KIT CLEAN ENDO COMPLIANCE (KITS) ×3 IMPLANT
KIT SUCTION CATH 14FR (SUCTIONS) ×3 IMPLANT
KIT TURNOVER KIT B (KITS) ×3 IMPLANT
MARKER SKIN DUAL TIP RULER LAB (MISCELLANEOUS) ×3 IMPLANT
NDL SPNL 22GX3.5 QUINCKE BK (NEEDLE) IMPLANT
NEEDLE SPNL 22GX3.5 QUINCKE BK (NEEDLE) ×3 IMPLANT
NS IRRIG 1000ML POUR BTL (IV SOLUTION) ×6 IMPLANT
OIL SILICONE PENTAX (PARTS (SERVICE/REPAIRS)) ×3 IMPLANT
PACK CHEST (CUSTOM PROCEDURE TRAY) ×3 IMPLANT
PAD ARMBOARD 7.5X6 YLW CONV (MISCELLANEOUS) ×6 IMPLANT
POUCH ENDO CATCH II 15MM (MISCELLANEOUS) IMPLANT
POUCH SPECIMEN RETRIEVAL 10MM (ENDOMECHANICALS) IMPLANT
RADIAL JAW LRG 4 PULMONARY (INSTRUMENTS) ×1
RELOAD STAPLE 45 4.1 GRN THCK (STAPLE) IMPLANT
RELOAD STAPLE 45 GOLD REG/THCK (STAPLE) IMPLANT
SEALANT PROGEL (MISCELLANEOUS) IMPLANT
SEALANT SURG COSEAL 4ML (VASCULAR PRODUCTS) IMPLANT
SEALANT SURG COSEAL 8ML (VASCULAR PRODUCTS) IMPLANT
SOLUTION ANTI FOG 6CC (MISCELLANEOUS) ×3 IMPLANT
SPECIMEN JAR MEDIUM (MISCELLANEOUS) ×2 IMPLANT
SPONGE INTESTINAL PEANUT (DISPOSABLE) ×1 IMPLANT
SPONGE TONSIL 1 RF SGL (DISPOSABLE) ×3 IMPLANT
STAPLE RELOAD 45 GRN (STAPLE) ×4 IMPLANT
STAPLE RELOAD 45MM GOLD (STAPLE) ×36 IMPLANT
STAPLE RELOAD 45MM GREEN (STAPLE) ×6
SUT PROLENE 4 0 RB 1 (SUTURE)
SUT PROLENE 4-0 RB1 .5 CRCL 36 (SUTURE) IMPLANT
SUT SILK  1 MH (SUTURE) ×2
SUT SILK 1 MH (SUTURE) ×4 IMPLANT
SUT SILK 2 0SH CR/8 30 (SUTURE) IMPLANT
SUT SILK 3 0SH CR/8 30 (SUTURE) IMPLANT
SUT VIC AB 1 CTX 36 (SUTURE) ×3
SUT VIC AB 1 CTX36XBRD ANBCTR (SUTURE) IMPLANT
SUT VIC AB 2-0 CTX 36 (SUTURE) ×1 IMPLANT
SUT VIC AB 2-0 UR6 27 (SUTURE) IMPLANT
SUT VIC AB 3-0 MH 27 (SUTURE) IMPLANT
SUT VIC AB 3-0 X1 27 (SUTURE) ×3 IMPLANT
SUT VICRYL 2 TP 1 (SUTURE) IMPLANT
SYR 20CC LL (SYRINGE) IMPLANT
SYR 20ML ECCENTRIC (SYRINGE) ×6 IMPLANT
SYR 30ML LL (SYRINGE) ×1 IMPLANT
SYR 5ML LL (SYRINGE) ×3 IMPLANT
SYR 5ML LUER SLIP (SYRINGE) ×3 IMPLANT
SYSTEM SAHARA CHEST DRAIN ATS (WOUND CARE) ×3 IMPLANT
TAPE CLOTH 4X10 WHT NS (GAUZE/BANDAGES/DRESSINGS) ×3 IMPLANT
TAPE CLOTH SURG 4X10 WHT LF (GAUZE/BANDAGES/DRESSINGS) ×1 IMPLANT
TIP APPLICATOR SPRAY EXTEND 16 (VASCULAR PRODUCTS) IMPLANT
TOWEL GREEN STERILE (TOWEL DISPOSABLE) ×3 IMPLANT
TOWEL GREEN STERILE FF (TOWEL DISPOSABLE) ×3 IMPLANT
TRAP SPECIMEN MUCOUS 40CC (MISCELLANEOUS) ×3 IMPLANT
TRAY FOLEY MTR SLVR 16FR STAT (SET/KITS/TRAYS/PACK) ×3 IMPLANT
TROCAR XCEL BLADELESS 5X75MML (TROCAR) ×3 IMPLANT
TROCAR XCEL NON-BLD 5MMX100MML (ENDOMECHANICALS) IMPLANT
TUBE CONNECTING 20X1/4 (TUBING) ×3 IMPLANT
TUNNELER SHEATH ON-Q 11GX8 DSP (PAIN MANAGEMENT) IMPLANT
VALVE DISPOSABLE (MISCELLANEOUS) ×3 IMPLANT
WATER STERILE IRR 1000ML POUR (IV SOLUTION) ×6 IMPLANT

## 2018-02-08 NOTE — Anesthesia Procedure Notes (Addendum)
Procedure Name: Intubation Date/Time: 02/08/2018 8:43 AM Performed by: Moshe Salisbury, CRNA Pre-anesthesia Checklist: Patient identified, Emergency Drugs available, Suction available and Patient being monitored Patient Re-evaluated:Patient Re-evaluated prior to induction Oxygen Delivery Method: Circle System Utilized Preoxygenation: Pre-oxygenation with 100% oxygen Induction Type: IV induction Ventilation: Mask ventilation without difficulty Laryngoscope Size: Mac and 3 Grade View: Grade I Tube type: Oral Tube size: 8.5 mm Number of attempts: 1 Airway Equipment and Method: Stylet Placement Confirmation: ETT inserted through vocal cords under direct vision,  positive ETCO2 and breath sounds checked- equal and bilateral Secured at: 21 cm Tube secured with: Tape Dental Injury: Teeth and Oropharynx as per pre-operative assessment

## 2018-02-08 NOTE — Transfer of Care (Signed)
Immediate Anesthesia Transfer of Care Note  Patient: Sharon [name]  Procedure(s) Performed: VIDEO BRONCHOSCOPY (N/A ) VIDEO ASSISTED THORACOSCOPY (Left Chest) LUNG BIOPSY (Left Chest)  Patient Location: PACU  Anesthesia Type:General  Level of Consciousness: sedated and patient cooperative  Airway & Oxygen Therapy: Patient Spontanous Breathing and Patient connected to face mask oxygen  Post-op Assessment: Report given to RN, Post -op Vital signs reviewed and stable and Patient moving all extremities  Post vital signs: Reviewed and stable  Last Vitals:  Vitals Value Taken Time  BP 129/56 02/08/2018 11:14 AM  Temp    Pulse 80 02/08/2018 11:18 AM  Resp 11 02/08/2018 11:18 AM  SpO2 99 % 02/08/2018 11:18 AM  Vitals shown include unvalidated device data.  Last Pain:  Vitals:   02/08/18 0654  TempSrc: Oral  PainSc: 0-No pain      Patients Stated Pain Goal: 2 (12/87/86 7672)  Complications: No apparent anesthesia complications

## 2018-02-08 NOTE — Brief Op Note (Addendum)
02/08/2018  10:54 AM  PATIENT:  Sharon [name]  67 y.o. female  PRE-OPERATIVE DIAGNOSIS:  ILD  POST-OPERATIVE DIAGNOSIS:  ILD  PROCEDURE:  Procedure(s):  VIDEO BRONCHOSCOPY (N/A)  VIDEO ASSISTED THORACOSCOPY   LUNG BIOPSY  -LUL x 3 -LLL x 1  INTERCOSTAL NERVE BLOCK with ESPAREL  SURGEON:  Surgeon(s) and Role:    * Melrose Nakayama, MD - Primary  PHYSICIAN ASSISTANT: Ellwood Handler PA-C  ANESTHESIA:   general  EBL:  100 mL   BLOOD ADMINISTERED:none  DRAINS: 28 Blake Drain Left Chest   LOCAL MEDICATIONS USED:  BUPIVICAINE   SPECIMEN:  Source of Specimen:  Lul x 3, LLL  DISPOSITION OF SPECIMEN:  PATHOLOGY  COUNTS:  YES  TOURNIQUET:  * No tourniquets in log *  DICTATION: .Dragon Dictation  PLAN OF CARE: Admit to inpatient   PATIENT DISPOSITION:  PACU - hemodynamically stable.   Delay start of Pharmacological VTE agent (>24hrs) due to surgical blood loss or risk of bleeding: no

## 2018-02-08 NOTE — Anesthesia Procedure Notes (Signed)
Procedure Name: Intubation Date/Time: 02/08/2018 9:12 AM Performed by: Moshe Salisbury, CRNA Pre-anesthesia Checklist: Patient identified, Emergency Drugs available, Suction available and Patient being monitored Patient Re-evaluated:Patient Re-evaluated prior to induction Oxygen Delivery Method: Circle System Utilized Grade View: Grade I Tube type: Oral Tube size: 8.0 mm Number of attempts: 3 (Attempted to exchange OETT for 37 fr DLT, unable to pass DLT through cords, 8.0 mm OETT inserted easily and ventilation resumed while smaller DLT obtained) Airway Equipment and Method: Stylet Placement Confirmation: ETT inserted through vocal cords under direct vision,  positive ETCO2 and breath sounds checked- equal and bilateral Secured at: 21 cm Tube secured with: Tape Dental Injury: Teeth and Oropharynx as per pre-operative assessment

## 2018-02-08 NOTE — Plan of Care (Signed)
Continue current care plan 

## 2018-02-08 NOTE — Anesthesia Preprocedure Evaluation (Signed)
Anesthesia Evaluation  Patient identified by MRN, date of birth, ID band Patient awake    Reviewed: Allergy & Precautions, NPO status , Patient's Chart, lab work & pertinent test results  Airway Mallampati: I  TM Distance: >3 FB Neck ROM: Full    Dental   Pulmonary former smoker,    Pulmonary exam normal        Cardiovascular hypertension, Pt. on medications Normal cardiovascular exam     Neuro/Psych Depression CVA    GI/Hepatic   Endo/Other    Renal/GU      Musculoskeletal   Abdominal   Peds  Hematology   Anesthesia Other Findings   Reproductive/Obstetrics                             Anesthesia Physical Anesthesia Plan  ASA: III  Anesthesia Plan: General   Post-op Pain Management:    Induction: Intravenous  PONV Risk Score and Plan: 3 and Ondansetron, Midazolam and Treatment may vary due to age or medical condition  Airway Management Planned: Double Lumen EBT and Video Laryngoscope Planned  Additional Equipment: Arterial line, CVP and Ultrasound Guidance Line Placement  Intra-op Plan:   Post-operative Plan: Extubation in OR  Informed Consent: I have reviewed the patients History and Physical, chart, labs and discussed the procedure including the risks, benefits and alternatives for the proposed anesthesia with the patient or authorized representative who has indicated his/her understanding and acceptance.     Plan Discussed with: CRNA and Surgeon  Anesthesia Plan Comments:         Anesthesia Quick Evaluation

## 2018-02-08 NOTE — Anesthesia Postprocedure Evaluation (Signed)
Anesthesia Post Note  Patient: Sharon [name]  Procedure(s) Performed: VIDEO BRONCHOSCOPY (N/A ) VIDEO ASSISTED THORACOSCOPY (Left Chest) LUNG BIOPSY (Left Chest)     Patient location during evaluation: PACU Anesthesia Type: General Level of consciousness: awake and alert Pain management: pain level controlled Vital Signs Assessment: post-procedure vital signs reviewed and stable Respiratory status: spontaneous breathing, nonlabored ventilation, respiratory function stable and patient connected to nasal cannula oxygen Cardiovascular status: blood pressure returned to baseline and stable Postop Assessment: no apparent nausea or vomiting Anesthetic complications: no    Last Vitals:  Vitals:   02/08/18 1530 02/08/18 1545  BP: (!) 149/68   Pulse: 70 70  Resp: 18 16  Temp: 36.6 C   SpO2: 96% 96%    Last Pain:  Vitals:   02/08/18 1530  TempSrc:   PainSc: Asleep                 Dezmon Conover DAVID

## 2018-02-08 NOTE — Anesthesia Procedure Notes (Signed)
Arterial Line Insertion Start/End5/15/2019 7:50 AM, 02/08/2018 8:00 AM Performed by: Moshe Salisbury, CRNA, CRNA  Patient location: Pre-op. Preanesthetic checklist: patient identified, IV checked, site marked, risks and benefits discussed, surgical consent, monitors and equipment checked, pre-op evaluation, timeout performed and anesthesia consent Lidocaine 1% used for infiltration and patient sedated Left, radial was placed Catheter size: 20 G Hand hygiene performed , maximum sterile barriers used  and Seldinger technique used  Attempts: 1 Procedure performed without using ultrasound guided technique. Following insertion, dressing applied and Biopatch. Post procedure assessment: normal  Patient tolerated the procedure well with no immediate complications.

## 2018-02-08 NOTE — Anesthesia Procedure Notes (Signed)
Central Venous Catheter Insertion Performed by: Lillia Abed, MD, anesthesiologist Start/End5/15/2019 8:00 AM, 02/08/2018 8:10 AM Patient location: Pre-op. Preanesthetic checklist: patient identified, IV checked, risks and benefits discussed, surgical consent, monitors and equipment checked, pre-op evaluation, timeout performed and anesthesia consent Position: Trendelenburg Lidocaine 1% used for infiltration and patient sedated Hand hygiene performed  and maximum sterile barriers used  Catheter size: 8 Fr Total catheter length 16. Central line was placed.Double lumen Procedure performed using ultrasound guided technique. Ultrasound Notes:anatomy identified, needle tip was noted to be adjacent to the nerve/plexus identified, no ultrasound evidence of intravascular and/or intraneural injection and image(s) printed for medical record Attempts: 1 Following insertion, dressing applied, line sutured and Biopatch. Post procedure assessment: blood return through all ports, free fluid flow and no air  Patient tolerated the procedure well with no immediate complications.

## 2018-02-08 NOTE — H&P (Signed)
PCP is Chesley Noon, MD Referring Provider is Brand Males, MD      Chief Complaint  Patient presents with  . Interstitial Lung Disease    Surgical eval, lung biopsy evaluation Chest CT 11/14/17, PFT 12/13/17,     HPI: Sharon [name] is sent for consultation regarding a lung biopsy for interstitial lung disease.  Sharon [name] is a 67 year old woman with a past medical history significant for hypertension, hyperlipidemia, depression, degenerative disc disease, polyarthropathy, pneumonia, interstitial lung disease, and a recent TIA.  Her history of lung disease dates back several years with episodic coughing.  She said that she would get bronchitis with a severe cough about once a year.  She had a particularly bad episode in December 2018.  She had a severe persistent cough, general malaise, and wheezing.  She was treated empirically with antibiotics and prednisone.  She says that she has been treated 3 times and each time she would improve but is soon as the prednisone stopped her symptoms would worsen again.  She had a CT in February which showed probable interstitial lung disease.  The pattern was not classic for UIP.  She was referred to Dr. Chase Caller.  Workup including an autoimmune panel was nondiagnostic.  He suspects hypersensitivity pneumonitis, idiopathic pulmonary fibrosis, or nonspecific interstitial pneumonitis.  He needs a lung biopsy for diagnostic purposes.  She had an episode about 6 weeks ago with altered vision in her right eye and difficulty speaking.  EMS was called but by the time they arrived her symptoms had improved and she did not go to the hospital.  She was referred to Dr. Leonie Man.  His note indicates she had a posterior circulation transient ischemic attack.  She was started on Plavix.      Past Medical History:  Diagnosis Date  . Chronic major depressive disorder   . DDD (degenerative disc disease), cervical   . Depression   . Hypertension   . Mixed  hyperlipidemia   . Pain in joints of both feet   . Pneumonia 2016  . Polyarthropathy   . Vision abnormalities    catracts left eye  . Vitamin D deficiency          Past Surgical History:  Procedure Laterality Date  . CARPAL TUNNEL RELEASE    . CESAREAN SECTION    . TAYLOR BUNIONECTOMY           Family History  Problem Relation Age of Onset  . Emphysema Father   . Asthma Father   . Congestive Heart Failure Father   . Stroke Father   . Asthma Sister   . COPD Sister   . Stroke Mother     Social History Social History        Tobacco Use  . Smoking status: Former Smoker    Packs/day: 1.00    Years: 30.00    Pack years: 30.00    Types: Cigarettes    Last attempt to quit: 02/03/1979    Years since quitting: 38.9  . Smokeless tobacco: Never Used  Substance Use Topics  . Alcohol use: Yes    Alcohol/week: 0.6 oz    Types: 1 Standard drinks or equivalent per week  . Drug use: Yes    Frequency: 7.0 times per week    Comment: 10 yrs. to current          Current Outpatient Medications  Medication Sig Dispense Refill  . albuterol (PROVENTIL HFA;VENTOLIN HFA) 108 (90 BASE) MCG/ACT inhaler Inhale 2  puffs into the lungs every 4 (four) hours as needed for wheezing or shortness of breath.    . ALPRAZolam (XANAX) 0.5 MG tablet Take 0.25-0.5 mg by mouth daily as needed for anxiety.    Marland Kitchen atorvastatin (LIPITOR) 40 MG tablet Take 1 tablet (40 mg total) by mouth daily. 90 tablet 1  . budesonide-formoterol (SYMBICORT) 80-4.5 MCG/ACT inhaler Take 2 puffs first thing in am and then another 2 puffs about 12 hours later. 1 Inhaler 12  . clopidogrel (PLAVIX) 75 MG tablet Take 1 tablet (75 mg total) by mouth daily. 21 tablet 0  . dextromethorphan-guaiFENesin (MUCINEX DM) 30-600 MG per 12 hr tablet Take 1 tablet by mouth 2 (two) times daily as needed.     . DULoxetine (CYMBALTA) 30 MG capsule Take one cap daily at bedtime x 1 week then 2  caps daily at bedtime.    Marland Kitchen losartan (COZAAR) 50 MG tablet     . traMADol (ULTRAM) 50 MG tablet Take 50-100 mg by mouth daily.     . travoprost, benzalkonium, (TRAVATAN) 0.004 % ophthalmic solution Place 1 drop into both eyes at bedtime.     No current facility-administered medications for this visit.          Allergies  Allergen Reactions  . Codeine     GI upset    Review of Systems  Constitutional: Positive for unexpected weight change (lodt 5 lbs last 3 months).  HENT: Positive for voice change. Negative for trouble swallowing.   Eyes: Positive for visual disturbance (With mini stroke-resolved).  Respiratory: Positive for cough, shortness of breath and wheezing.   Cardiovascular: Negative for chest pain and leg swelling.  Gastrointestinal: Negative for abdominal distention and abdominal pain.  Musculoskeletal: Positive for arthralgias and joint swelling.  Neurological: Positive for speech difficulty (With mini stroke- resolved). Negative for syncope, facial asymmetry and weakness.  Psychiatric/Behavioral: Positive for dysphoric mood. The patient is not nervous/anxious.   All other systems reviewed and are negative.   BP 137/80   Pulse 78   Resp 20   Ht 5' 1.5" (1.562 m)   Wt 149 lb (67.6 kg)   SpO2 95% Comment: RA  BMI 27.70 kg/m  Physical Exam  Constitutional: She is oriented to person, place, and time. She appears well-developed and well-nourished. No distress.  HENT:  Head: Normocephalic and atraumatic.  Mouth/Throat: No oropharyngeal exudate.  Eyes: Conjunctivae and EOM are normal. No scleral icterus.  Neck: Neck supple. No thyromegaly present.  Cardiovascular: Normal rate, regular rhythm and normal heart sounds. Exam reveals no gallop and no friction rub.  No murmur heard. Pulmonary/Chest: Effort normal. No respiratory distress. She has wheezes. She has rales.  Abdominal: Soft. She exhibits no distension. There is no tenderness.   Musculoskeletal: She exhibits no edema or deformity.  Lymphadenopathy:    She has no cervical adenopathy.  Neurological: She is alert and oriented to person, place, and time. No cranial nerve deficit. She exhibits normal muscle tone.  Skin: Skin is warm and dry.  Vitals reviewed.    Diagnostic Tests: CT CHEST WITHOUT CONTRAST  TECHNIQUE: Multidetector CT imaging of the chest was performed following the standard protocol without intravenous contrast. High resolution imaging of the lungs, as well as inspiratory and expiratory imaging, was performed.  COMPARISON: 02/07/2017.  FINDINGS: Cardiovascular: Atherosclerotic calcification of the arterial vasculature, including coronary arteries. Heart size normal. No pericardial effusion.  Mediastinum/Nodes: Mediastinal lymph nodes are not enlarged by CT size criteria. Hilar regions are difficult to definitively  evaluate without IV contrast but appear grossly unremarkable. No axillary adenopathy. Esophagus is grossly unremarkable.  Lungs/Pleura: Upper and midlung zone predominant interstitial coarsening, ground-glass, subpleural reticulation and traction bronchiectasis/bronchiolectasis. Findings appears stable from 02/07/2017. 4 mm posterior segment right upper lobe nodule (image 40), unchanged. No pleural fluid. Airway is otherwise unremarkable. No air trapping.  Upper Abdomen: 18 mm low-attenuation lesion in the right hepatic lobe is unchanged and likely a cyst. Definitive characterization is limited without post-contrast imaging. Visualized portions of the liver, gallbladder, adrenal glands, kidneys, spleen pancreas, stomach and bowel are otherwise grossly unremarkable. No upper abdominal adenopathy.  Musculoskeletal: Degenerative changes in the spine. No worrisome lytic or sclerotic lesions.  IMPRESSION: 1. Pulmonary parenchymal pattern of fibrotic interstitial lung disease is unchanged from 02/07/2017 and may  be due to nonspecific interstitial pneumonitis or mild chronic hypersensitivity pneumonitis. Findings are not consistent with usual interstitial pneumonitis. 2. Aortic atherosclerosis (ICD10-170.0). Coronary artery calcification.   Electronically Signed By: Lorin Picket M.D. On: 11/14/2017 12:56 I personally reviewed the CT images and concur with the findings noted above  Pulmonary function testing 12/13/2017 FVC 1.65 (58%) FEV1 1.39 (64%) FEV1 1.76 (81%) postbronchodilator DLCO 14.35 (68%)  Impression: Sharon [name] is a 67 year old woman with a history of history of hypertension, hyperlipidemia, depression, degenerative disc disease, polyarthropathy, pneumonia, and a recent TIA.  She has had a persistent cough and wheezing over the past 4 months.  A CT of the chest shows interstitial lung disease.  She needs a surgical lung biopsy for diagnostic purposes.  I discussed the procedure with Sharon [name] and her husband.  They understand this is diagnostic and not therapeutic.  They understand it is a major operative procedure.  We would do a left VATS for lung biopsy.  I informed them of the general nature of the procedure, the need for general anesthesia, the incisions to be used, and the use of drains to postoperatively.  We discussed expected hospital stay and overall recovery.  I informed them of the indications, risks, benefits, and alternatives.  They understand the risk include, but not limited to death, MI, DVT, PE, bleeding, possible need for transfusion, infection, air leaks, cardiac arrhythmias, as well as possibility of other unforeseeable complications.  Dr. Chase Caller had discussed participating in the [place] study with her.  They understand that involves bronchoscopy with transbronchial biopsies.  He does not appreciably change the risk of the procedure but will lengthen the procedure.  She wishes to proceed with that as well.  I will need to check with Dr. Leonie Man  regarding her recent TIA and her stroke risk with an operative procedure.  She will need to be off of Plavix for 5 days prior to the procedure.  Plan: Bronchoscopy for biopsy for BRAVE study and left VATS for lung biopsy-timing to be determined.  Will hold Plavix for 5 days prior to surgery  Melrose Nakayama, MD [facility] [phone]   Had some upper respiratory symptoms earlier this week. No fevers or chills. Symptoms have resolved. Will proceed with biopsy.  Revonda Standard Roxan Hockey, MD [facility] [phone]

## 2018-02-08 NOTE — H&P (Signed)
PCP is Chesley Noon, MD Referring Provider is Brand Males, MD      Chief Complaint  Patient presents with  . Interstitial Lung Disease    Surgical eval, lung biopsy evaluation Chest CT 11/14/17, PFT 12/13/17,     HPI: Mrs. Plath is sent for consultation regarding a lung biopsy for interstitial lung disease.  Aleighna Wojtas is a 67 year old woman with a past medical history significant for hypertension, hyperlipidemia, depression, degenerative disc disease, polyarthropathy, pneumonia, interstitial lung disease, and a recent TIA.  Her history of lung disease dates back several years with episodic coughing.  She said that she would get bronchitis with a severe cough about once a year.  She had a particularly bad episode in December 2018.  She had a severe persistent cough, general malaise, and wheezing.  She was treated empirically with antibiotics and prednisone.  She says that she has been treated 3 times and each time she would improve but is soon as the prednisone stopped her symptoms would worsen again.  She had a CT in February which showed probable interstitial lung disease.  The pattern was not classic for UIP.  She was referred to Dr. Chase Caller.  Workup including an autoimmune panel was nondiagnostic.  He suspects hypersensitivity pneumonitis, idiopathic pulmonary fibrosis, or nonspecific interstitial pneumonitis.  He needs a lung biopsy for diagnostic purposes.  She had an episode about 6 weeks ago with altered vision in her right eye and difficulty speaking.  EMS was called but by the time they arrived her symptoms had improved and she did not go to the hospital.  She was referred to Dr. Leonie Man.  His note indicates she had a posterior circulation transient ischemic attack.  She was started on Plavix.      Past Medical History:  Diagnosis Date  . Chronic major depressive disorder   . DDD (degenerative disc disease), cervical   . Depression   . Hypertension   . Mixed  hyperlipidemia   . Pain in joints of both feet   . Pneumonia 2016  . Polyarthropathy   . Vision abnormalities    catracts left eye  . Vitamin D deficiency          Past Surgical History:  Procedure Laterality Date  . CARPAL TUNNEL RELEASE    . CESAREAN SECTION    . TAYLOR BUNIONECTOMY           Family History  Problem Relation Age of Onset  . Emphysema Father   . Asthma Father   . Congestive Heart Failure Father   . Stroke Father   . Asthma Sister   . COPD Sister   . Stroke Mother     Social History Social History        Tobacco Use  . Smoking status: Former Smoker    Packs/day: 1.00    Years: 30.00    Pack years: 30.00    Types: Cigarettes    Last attempt to quit: 02/03/1979    Years since quitting: 38.9  . Smokeless tobacco: Never Used  Substance Use Topics  . Alcohol use: Yes    Alcohol/week: 0.6 oz    Types: 1 Standard drinks or equivalent per week  . Drug use: Yes    Frequency: 7.0 times per week    Comment: 10 yrs. to current          Current Outpatient Medications  Medication Sig Dispense Refill  . albuterol (PROVENTIL HFA;VENTOLIN HFA) 108 (90 BASE) MCG/ACT inhaler Inhale 2  puffs into the lungs every 4 (four) hours as needed for wheezing or shortness of breath.    . ALPRAZolam (XANAX) 0.5 MG tablet Take 0.25-0.5 mg by mouth daily as needed for anxiety.    Marland Kitchen atorvastatin (LIPITOR) 40 MG tablet Take 1 tablet (40 mg total) by mouth daily. 90 tablet 1  . budesonide-formoterol (SYMBICORT) 80-4.5 MCG/ACT inhaler Take 2 puffs first thing in am and then another 2 puffs about 12 hours later. 1 Inhaler 12  . clopidogrel (PLAVIX) 75 MG tablet Take 1 tablet (75 mg total) by mouth daily. 21 tablet 0  . dextromethorphan-guaiFENesin (MUCINEX DM) 30-600 MG per 12 hr tablet Take 1 tablet by mouth 2 (two) times daily as needed.     . DULoxetine (CYMBALTA) 30 MG capsule Take one cap daily at bedtime x 1 week then 2  caps daily at bedtime.    Marland Kitchen losartan (COZAAR) 50 MG tablet     . traMADol (ULTRAM) 50 MG tablet Take 50-100 mg by mouth daily.     . travoprost, benzalkonium, (TRAVATAN) 0.004 % ophthalmic solution Place 1 drop into both eyes at bedtime.     No current facility-administered medications for this visit.          Allergies  Allergen Reactions  . Codeine     GI upset    Review of Systems  Constitutional: Positive for unexpected weight change (lodt 5 lbs last 3 months).  HENT: Positive for voice change. Negative for trouble swallowing.   Eyes: Positive for visual disturbance (With mini stroke-resolved).  Respiratory: Positive for cough, shortness of breath and wheezing.   Cardiovascular: Negative for chest pain and leg swelling.  Gastrointestinal: Negative for abdominal distention and abdominal pain.  Musculoskeletal: Positive for arthralgias and joint swelling.  Neurological: Positive for speech difficulty (With mini stroke- resolved). Negative for syncope, facial asymmetry and weakness.  Psychiatric/Behavioral: Positive for dysphoric mood. The patient is not nervous/anxious.   All other systems reviewed and are negative.   BP 137/80   Pulse 78   Resp 20   Ht 5' 1.5" (1.562 m)   Wt 149 lb (67.6 kg)   SpO2 95% Comment: RA  BMI 27.70 kg/m  Physical Exam  Constitutional: She is oriented to person, place, and time. She appears well-developed and well-nourished. No distress.  HENT:  Head: Normocephalic and atraumatic.  Mouth/Throat: No oropharyngeal exudate.  Eyes: Conjunctivae and EOM are normal. No scleral icterus.  Neck: Neck supple. No thyromegaly present.  Cardiovascular: Normal rate, regular rhythm and normal heart sounds. Exam reveals no gallop and no friction rub.  No murmur heard. Pulmonary/Chest: Effort normal. No respiratory distress. She has wheezes. She has rales.  Abdominal: Soft. She exhibits no distension. There is no tenderness.   Musculoskeletal: She exhibits no edema or deformity.  Lymphadenopathy:    She has no cervical adenopathy.  Neurological: She is alert and oriented to person, place, and time. No cranial nerve deficit. She exhibits normal muscle tone.  Skin: Skin is warm and dry.  Vitals reviewed.    Diagnostic Tests: CT CHEST WITHOUT CONTRAST  TECHNIQUE: Multidetector CT imaging of the chest was performed following the standard protocol without intravenous contrast. High resolution imaging of the lungs, as well as inspiratory and expiratory imaging, was performed.  COMPARISON: 02/07/2017.  FINDINGS: Cardiovascular: Atherosclerotic calcification of the arterial vasculature, including coronary arteries. Heart size normal. No pericardial effusion.  Mediastinum/Nodes: Mediastinal lymph nodes are not enlarged by CT size criteria. Hilar regions are difficult to definitively  evaluate without IV contrast but appear grossly unremarkable. No axillary adenopathy. Esophagus is grossly unremarkable.  Lungs/Pleura: Upper and midlung zone predominant interstitial coarsening, ground-glass, subpleural reticulation and traction bronchiectasis/bronchiolectasis. Findings appears stable from 02/07/2017. 4 mm posterior segment right upper lobe nodule (image 40), unchanged. No pleural fluid. Airway is otherwise unremarkable. No air trapping.  Upper Abdomen: 18 mm low-attenuation lesion in the right hepatic lobe is unchanged and likely a cyst. Definitive characterization is limited without post-contrast imaging. Visualized portions of the liver, gallbladder, adrenal glands, kidneys, spleen pancreas, stomach and bowel are otherwise grossly unremarkable. No upper abdominal adenopathy.  Musculoskeletal: Degenerative changes in the spine. No worrisome lytic or sclerotic lesions.  IMPRESSION: 1. Pulmonary parenchymal pattern of fibrotic interstitial lung disease is unchanged from 02/07/2017 and may  be due to nonspecific interstitial pneumonitis or mild chronic hypersensitivity pneumonitis. Findings are not consistent with usual interstitial pneumonitis. 2. Aortic atherosclerosis (ICD10-170.0). Coronary artery calcification.   Electronically Signed By: Lorin Picket M.D. On: 11/14/2017 12:56 I personally reviewed the CT images and concur with the findings noted above  Pulmonary function testing 12/13/2017 FVC 1.65 (58%) FEV1 1.39 (64%) FEV1 1.76 (81%) postbronchodilator DLCO 14.35 (68%)  Impression: Mrs. Strehle is a 67 year old woman with a history of history of hypertension, hyperlipidemia, depression, degenerative disc disease, polyarthropathy, pneumonia, and a recent TIA.  She has had a persistent cough and wheezing over the past 4 months.  A CT of the chest shows interstitial lung disease.  She needs a surgical lung biopsy for diagnostic purposes.  I discussed the procedure with Mrs. Eisenmenger and her husband.  They understand this is diagnostic and not therapeutic.  They understand it is a major operative procedure.  We would do a left VATS for lung biopsy.  I informed them of the general nature of the procedure, the need for general anesthesia, the incisions to be used, and the use of drains to postoperatively.  We discussed expected hospital stay and overall recovery.  I informed them of the indications, risks, benefits, and alternatives.  They understand the risk include, but not limited to death, MI, DVT, PE, bleeding, possible need for transfusion, infection, air leaks, cardiac arrhythmias, as well as possibility of other unforeseeable complications.  Dr. Chase Caller had discussed participating in the [place] study with her.  They understand that involves bronchoscopy with transbronchial biopsies.  He does not appreciably change the risk of the procedure but will lengthen the procedure.  She wishes to proceed with that as well.  I will need to check with Dr. Leonie Man  regarding her recent TIA and her stroke risk with an operative procedure.  She will need to be off of Plavix for 5 days prior to the procedure.  Plan: Bronchoscopy for biopsy for BRAVE study and left VATS for lung biopsy-timing to be determined.  Will hold Plavix for 5 days prior to surgery  Melrose Nakayama, MD [facility] [phone]

## 2018-02-08 NOTE — Anesthesia Procedure Notes (Signed)
Procedure Name: Intubation Date/Time: 02/08/2018 9:21 AM Performed by: Moshe Salisbury, CRNA Pre-anesthesia Checklist: Patient identified, Emergency Drugs available, Suction available and Patient being monitored Patient Re-evaluated:Patient Re-evaluated prior to induction Oxygen Delivery Method: Circle System Utilized Laryngoscope Size: Mac and 4 Grade View: Grade I Endobronchial tube: Left and Double lumen EBT and 35 Fr Number of attempts: 1 Airway Equipment and Method: Stylet Placement Confirmation: ETT inserted through vocal cords under direct vision,  positive ETCO2 and breath sounds checked- equal and bilateral Secured at: 36 cm Tube secured with: Tape Dental Injury: Teeth and Oropharynx as per pre-operative assessment

## 2018-02-09 ENCOUNTER — Inpatient Hospital Stay (HOSPITAL_COMMUNITY): Payer: Medicare Other

## 2018-02-09 ENCOUNTER — Encounter (HOSPITAL_COMMUNITY): Payer: Self-pay | Admitting: Thoracic Surgery (Cardiothoracic Vascular Surgery)

## 2018-02-09 LAB — BLOOD GAS, ARTERIAL
Acid-Base Excess: 1.7 mmol/L (ref 0.0–2.0)
Bicarbonate: 26 mmol/L (ref 20.0–28.0)
DRAWN BY: 270271
O2 CONTENT: 2 L/min
O2 SAT: 98.8 %
PATIENT TEMPERATURE: 98.6
pCO2 arterial: 42.8 mmHg (ref 32.0–48.0)
pH, Arterial: 7.401 (ref 7.350–7.450)
pO2, Arterial: 123 mmHg — ABNORMAL HIGH (ref 83.0–108.0)

## 2018-02-09 LAB — BASIC METABOLIC PANEL
ANION GAP: 7 (ref 5–15)
BUN: 6 mg/dL (ref 6–20)
CALCIUM: 8.1 mg/dL — AB (ref 8.9–10.3)
CO2: 25 mmol/L (ref 22–32)
Chloride: 107 mmol/L (ref 101–111)
Creatinine, Ser: 0.68 mg/dL (ref 0.44–1.00)
Glucose, Bld: 105 mg/dL — ABNORMAL HIGH (ref 65–99)
POTASSIUM: 3.5 mmol/L (ref 3.5–5.1)
Sodium: 139 mmol/L (ref 135–145)

## 2018-02-09 LAB — ACID FAST SMEAR (AFB, MYCOBACTERIA): Acid Fast Smear: NEGATIVE

## 2018-02-09 LAB — CBC
HCT: 35.2 % — ABNORMAL LOW (ref 36.0–46.0)
HEMOGLOBIN: 11.3 g/dL — AB (ref 12.0–15.0)
MCH: 26.6 pg (ref 26.0–34.0)
MCHC: 32.1 g/dL (ref 30.0–36.0)
MCV: 82.8 fL (ref 78.0–100.0)
PLATELETS: 333 10*3/uL (ref 150–400)
RBC: 4.25 MIL/uL (ref 3.87–5.11)
RDW: 14.1 % (ref 11.5–15.5)
WBC: 9.6 10*3/uL (ref 4.0–10.5)

## 2018-02-09 MED ORDER — CLOPIDOGREL BISULFATE 75 MG PO TABS
75.0000 mg | ORAL_TABLET | Freq: Every day | ORAL | Status: DC
  Administered 2018-02-09 – 2018-02-11 (×3): 75 mg via ORAL
  Filled 2018-02-09 (×3): qty 1

## 2018-02-09 MED ORDER — SODIUM CHLORIDE 0.45 % IV SOLN
INTRAVENOUS | Status: DC
  Administered 2018-02-09 (×2): via INTRAVENOUS

## 2018-02-09 NOTE — Op Note (Signed)
NAME: Sharon [name], [facility] MEDICAL RECORD SF:[id number] ACCOUNT [id number] DATE OF BIRTH:04-02-1951 FACILITY: [facility] LOCATION: [facility] PHYSICIAN:Lanier Millon Chaya Jan, MD  OPERATIVE REPORT  DATE OF PROCEDURE:  02/08/2018  PREOPERATIVE DIAGNOSIS:  Interstitial lung disease.  POSTOPERATIVE DIAGNOSIS:  Interstitial lung disease.  PROCEDURE:   1. Video-assisted bronchoscopy with transbronchial biopsies 2. Left video-assisted thoracoscopy, lung biopsy and intercostal nerve block.  SURGEON:   Modesto Charon, MD  ASSISTANT:  Ellwood Handler, PA.  ANESTHESIA:  General.  FINDINGS:  Bronchoscopy revealed normal endobronchial anatomy with no endobronchial lesions.  Lung with diffusely fibrous appearance, slow to deflate.  Fibrosis seen on frozen section.  CLINICAL NOTE:  The patient is a 67 year old woman with interstitial lung disease.  She has been having progressive symptoms recently requiring multiple trials of prednisone.  Dr. Chase Caller requested a lung biopsy to guide therapy.  The indications, risks,  benefits, and alternatives were discussed in detail with the patient.  She understood and accepted the risks and agreed to proceed.  DESCRIPTION OF PROCEDURE:  The patient was brought to the preoperative holding area on 02/08/2018.  Anesthesia placed an arterial blood pressure monitoring line and a central venous line.  She was taken to the operating room, anesthetized and intubated. Intravenous antibiotics were administered. Sequential compression devices were placed on the calves for DVT prophylaxis. A Foley catheter was placed.  Flexible fiberoptic bronchoscopy was performed via the endotracheal tube.  It revealed normal endobronchial anatomy with no endobronchial lesions to the level of the subsegmental bronchi.  Transbronchial biopsies were taken from the upper and lower  lobes.  Three biopsies were taken from each.  These were sent for permanent pathology.  There was minimal bleeding which  cleared easily with saline.  The bronchoscope was removed.  The patient was reintubated with a double lumen endotracheal tube.  She then was placed in a right lateral decubitus position and the left chest was prepped and draped in the usual sterile fashion.  Single lung ventilation of the right lung was initiated  and was tolerated well throughout the procedure.  An incision was made in the seventh interspace in the mid axillary line.  A 5 mm port was inserted into the chest.  The thoracoscope was advanced into the chest.  A working incision was made in the 5th  interspace anterolaterally.  No rib spreading was performed during the procedure.  The lung was inspected.  The areas that seemed to have the most involvement where the lingula, the anterolateral aspect of the left upper lobe and the posterolateral  aspect of the left lower lobe.  All biopsies were performed with sequential firings of an Echelon powered stapler.  A 45 mm stapler was used with gold cartridges with the exception of one staple line on the upper lobe.  Lingular biopsy was performed  first.  This piece of lung was more normal appearing than the remainder and was sent for permanent pathology only.  A biopsy was obtained from the posterolateral aspect of the left lower lobe with sequential firings of the stapler.  A small piece of this  was sent for AFB and fungal cultures.  The remainder was sent for frozen section.  While awaiting results of that, biopsy was taken from the anterior portion of the upper lobe.  This had a fibrous feel to it.  The frozen section returned showing fibrosis but additional tissue was requested.  The area of the lung and the superior anterolateral aspect of the left upper lobe then was biopsied.  The tissue was relatively thicker in this area.  The first and last  staple lines were with gold cartridges.  The middle staple line was done using a green cartridge due to the thickness of the tissue.  The specimen  was the most abnormal appearing of all, it was sent for permanent pathology.  There was good hemostasis at  all the staple lines.  A solution of 20 mL of liposomal bupivacaine diluted with 50 mL of saline was used to inject the port site, the working incision and 5 mL was injected into each intercostal space in a subpleural fashion from the third to the ninth  space.  A 28 French chest tube was placed through the port incision and directed to the apex.  It was secured at the skin with a #1 silk suture.  The left lung was reinflated.  The working incision was closed in three layers in standard fashion.  The chest  tube was placed to suction.  The patient was placed back in the supine position.    She was extubated in the operating room and taken to the [facility] in good condition.  AN/NUANCE  D:02/08/2018 T:02/09/2018 JOB:[id number]

## 2018-02-09 NOTE — Care Management Note (Signed)
Case Management Note  Patient Details  Name: Sharon [name] MRN: [id number] Date of Birth: 01/01/1951  Subjective/Objective:    Pt is s/p VATS                Action/Plan:  PTA from home with husband.  Pt has PCP.   CM will continue to follow for discharge needs   Expected Discharge Date:  02/13/18               Expected Discharge Plan:  Home/Self Care  In-House Referral:     Discharge planning Services  CM Consult  Post Acute Care Choice:    Choice offered to:     DME Arranged:    DME Agency:     HH Arranged:    HH Agency:     Status of Service:     If discussed at [employer], dates discussed:    Additional Comments:  Maryclare Labrador, RN 02/09/2018, 3:08 PM

## 2018-02-09 NOTE — Progress Notes (Addendum)
      [address]       [place] [address]             [phone]      1 Day Post-Op Procedure(s) (LRB): VIDEO BRONCHOSCOPY (N/A) VIDEO ASSISTED THORACOSCOPY (Left) LUNG BIOPSY (Left)   Subjective:  States she thinks she is doing good.  Pain is well controlled.  Denies current nausea, but states she has issues with nausea normally.  Objective: Vital signs in last 24 hours: Temp:  [97.3 F (36.3 C)-98.4 F (36.9 C)] 98.4 F (36.9 C) (05/15 1941) Pulse Rate:  [60-92] 60 (05/15 1617) Cardiac Rhythm: Normal sinus rhythm (05/16 0700) Resp:  [11-22] 17 (05/16 0411) BP: (101-154)/(56-106) 149/63 (05/15 2345) SpO2:  [91 %-100 %] 99 % (05/16 0411) Arterial Line BP: (146-193)/(52-80) 167/70 (05/15 2345) Weight:  [173 lb 11.6 oz (78.8 kg)] 173 lb 11.6 oz (78.8 kg) (05/15 1617)  Intake/Output from previous day: 05/15 0701 - 05/16 0700 In: 2705.3 [P.O.:360; I.V.:2145.3; IV Piggyback:200] Out: 2585 [Urine:2325; Blood:100; Chest Tube:160]  General appearance: alert, cooperative and no distress Heart: regular rate and rhythm Lungs: clear to auscultation bilaterally Abdomen: soft, non-tender; bowel sounds normal; no masses,  no organomegaly Extremities: extremities normal, atraumatic, no cyanosis or edema Wound: clean and dry  Lab Results: Recent Labs    02/07/18 1504 02/09/18 0418  WBC 10.6* 9.6  HGB 13.6 11.3*  HCT 42.2 35.2*  PLT 336 333   BMET:  Recent Labs    02/07/18 1504 02/09/18 0418  NA 134* 139  K 4.7 3.5  CL 102 107  CO2 19* 25  GLUCOSE 115* 105*  BUN 9 6  CREATININE 0.86 0.68  CALCIUM 9.4 8.1*    PT/INR:  Recent Labs    02/07/18 1504  LABPROT 14.2  INR 1.11   ABG    Component Value Date/Time   PHART 7.401 02/09/2018 0350   HCO3 26.0 02/09/2018 0350   O2SAT 98.8 02/09/2018 0350   CBG (last 3)  No results for input(s): GLUCAP in the last 72 hours.  Assessment/Plan: S/P Procedure(s) (LRB): VIDEO BRONCHOSCOPY (N/A) VIDEO  ASSISTED THORACOSCOPY (Left) LUNG BIOPSY (Left)  1. Chest tube- no air leak, output is minimal- will transition to water seal today 2. CV- NSR, H/O HTN SBP in the 160s this morning- home Cozaar ordered 3. Pulm- CXR stable in appearance, no pneumothorax, continue IS 4. D/C Arterial line 5. Decrease/change IV fluids to 1/2 NS at 50 ml/hr will transition to [facility] later today if patient tolerates diet 6. GI- nausea is issue for patient preoperative, continue zofran prn 7. DVT prophylaxis Lovenox is ordered 8. Dispo- patient stable, chest tubes to water seal, home cozaar for HTN, decrease IV fluids, repeat CXR in AM   LOS: 1 day    Ellwood Handler 02/09/2018 Patient seen and examined, agree with above CT to water seal, ambulate Restart Plavix  Remo Lipps C. Roxan Hockey, MD [facility] [phone]

## 2018-02-10 ENCOUNTER — Inpatient Hospital Stay (HOSPITAL_COMMUNITY): Payer: Medicare Other

## 2018-02-10 LAB — COMPREHENSIVE METABOLIC PANEL
ALT: 19 U/L (ref 14–54)
ANION GAP: 8 (ref 5–15)
AST: 24 U/L (ref 15–41)
Albumin: 2.8 g/dL — ABNORMAL LOW (ref 3.5–5.0)
Alkaline Phosphatase: 63 U/L (ref 38–126)
BUN: 10 mg/dL (ref 6–20)
CALCIUM: 8.3 mg/dL — AB (ref 8.9–10.3)
CO2: 27 mmol/L (ref 22–32)
CREATININE: 0.85 mg/dL (ref 0.44–1.00)
Chloride: 105 mmol/L (ref 101–111)
Glucose, Bld: 93 mg/dL (ref 65–99)
Potassium: 3.4 mmol/L — ABNORMAL LOW (ref 3.5–5.1)
Sodium: 140 mmol/L (ref 135–145)
TOTAL PROTEIN: 5.3 g/dL — AB (ref 6.5–8.1)
Total Bilirubin: 0.6 mg/dL (ref 0.3–1.2)

## 2018-02-10 LAB — CBC
HCT: 35.3 % — ABNORMAL LOW (ref 36.0–46.0)
HEMOGLOBIN: 11.1 g/dL — AB (ref 12.0–15.0)
MCH: 26.2 pg (ref 26.0–34.0)
MCHC: 31.4 g/dL (ref 30.0–36.0)
MCV: 83.5 fL (ref 78.0–100.0)
Platelets: 357 10*3/uL (ref 150–400)
RBC: 4.23 MIL/uL (ref 3.87–5.11)
RDW: 14.2 % (ref 11.5–15.5)
WBC: 8.9 10*3/uL (ref 4.0–10.5)

## 2018-02-10 NOTE — Discharge Summary (Addendum)
Physician Discharge Summary  Patient ID: Sharon [name] MRN: [id number] DOB/AGE: 1950-11-09 67 y.o.  Admit date: 02/08/2018 Discharge date: 02/11/2018  Admission Diagnoses:  Patient Active Problem List   Diagnosis Date Noted  . ILD (interstitial lung disease) ([place]) 02/08/2018  . Cough variant asthma vs UACS  02/03/2016  . Pulmonary nodules 02/03/2016   Discharge Diagnoses:   Patient Active Problem List   Diagnosis Date Noted  . ILD (interstitial lung disease) ([place]) 02/08/2018  . Cough variant asthma vs UACS  02/03/2016  . Pulmonary nodules 02/03/2016   Discharged Condition: good  History of Present Illness:  Sharon [name] is a 67 yo white female with PMH of Hypertension, Hyperlipidemia, Depression, Degenerative disc disease, Polyarthropathy, pneumonia, and Interstitial Lung Disease.  The patient states she has a long standing history of lung issues.  She states that it started several years ago with episodic coughing.  She states she would get bronchitis at least once a year.  She states in December she had a severe episode with persistent cough, malaise, and wheezing.  She was treated with empirically with antibiotics and prednisone.  She was treated 3 times but she states every time the prednisone would be stopped her symptoms would worsen again.  CT scan of the chest was obtained in February which showed probable interstitial lung disease.  She was referred to Dr. Chase Caller.  Further workup showed autoimmune testing to be non-diagnostic.  He felt a lung biopsy would be indicated for definitive diagnosis.  The patient was referred to [facility] and evaluated by Dr. Roxan Hockey who explained the procedure would not provide relief of her symptoms but would be diagnostic.  The risks and benefits of the procedure were explained to the patient and she was agreeable to proceed.    Hospital Course:   Sharon [name] presented to [facility] on 02/08/2018.  She was taken to the operating room and  underwent Video Assisted Bronchoscopy with transbronchial biopsies, left VATS with lung biopsies and intercostal nerve block.  She tolerated the procedure without difficulty, was extubated, and taken to the PACU in stable condition.  The patient progressed without issue post operatively.  She was hypertensive and restarted on her home Cozaar.  Her chest tube was free of air leak and transitioned to water seal on POD #1.  CXR have remained free of pneumothorax with stable appearance of bilateral lung densities.  Her chest tube was removed on 02/10/2018.  Follow up CXR showed Postsurgical changes left lung. Persistent tiny left apical pneumothorax with slight interval improvement. Her arterial line, central line, and foley catheter were removed without difficulty.  She is ambulating with minimal assistance.  Her pain is well controlled.  She is tolerating a regular diet.  She is medically stable for discharge home today.  Significant Diagnostic Studies: radiology:   CT scan:   1. Pulmonary parenchymal pattern of fibrotic interstitial lung disease is unchanged from 02/07/2017 and may be due to nonspecific interstitial pneumonitis or mild chronic hypersensitivity pneumonitis. Findings are not consistent with usual interstitial pneumonitis. 2. Aortic atherosclerosis (ICD10-170.0). Coronary artery calcification.  Treatments: surgery:    Video-assisted bronchoscopy with transbronchial biopsies, left video-assisted thoracoscopy, lung biopsy and intercostal nerve block.  Discharge Exam: Blood pressure (!) 126/105, pulse 80, temperature 98 F (36.7 C), temperature source Oral, resp. rate (!) 22, height 5\' 2"  (1.575 m), weight 173 lb 11.6 oz (78.8 kg), SpO2 100 %.   General appearance: alert and cooperative Neurologic: intact Heart: regular rate and rhythm, S1,  S2 normal, no murmur, click, rub or gallop Lungs: clear to auscultation bilaterally Abdomen: soft, non-tender; bowel sounds normal; no masses,   no organomegaly Wound: Left chest incisions healing well    Disposition: Discharge disposition: 01-Home or Self Care     Home  Discharge Medications:   Allergies as of 02/11/2018      Reactions   Codeine Nausea Only   GI upset      Medication List    STOP taking these medications   traMADol 50 MG tablet Commonly known as:  ULTRAM     TAKE these medications   acetaminophen 500 MG tablet Commonly known as:  TYLENOL Take 2 tablets (1,000 mg total) by mouth every 6 (six) hours.   albuterol 108 (90 Base) MCG/ACT inhaler Commonly known as:  PROVENTIL HFA;VENTOLIN HFA Inhale 2 puffs into the lungs every 4 (four) hours as needed for wheezing or shortness of breath.   ALPRAZolam 0.5 MG tablet Commonly known as:  XANAX Take 0.5 mg by mouth daily as needed for anxiety.   aspirin EC 81 MG tablet Take 81 mg by mouth daily.   atorvastatin 40 MG tablet Commonly known as:  LIPITOR Take 1 tablet (40 mg total) by mouth daily.   budesonide-formoterol 80-4.5 MCG/ACT inhaler Commonly known as:  SYMBICORT Take 2 puffs first thing in am and then another 2 puffs about 12 hours later.   calcium carbonate 750 MG chewable tablet Commonly known as:  TUMS EX Chew 1 tablet by mouth daily as needed for heartburn.   clopidogrel 75 MG tablet Commonly known as:  PLAVIX Take 1 tablet (75 mg total) by mouth daily.   dextromethorphan-guaiFENesin 30-600 MG 12hr tablet Commonly known as:  MUCINEX DM Take 1 tablet by mouth 2 (two) times daily as needed (congestion).   diclofenac 75 MG EC tablet Commonly known as:  VOLTAREN Take 75 mg by mouth daily.   DULoxetine 30 MG capsule Commonly known as:  CYMBALTA Take 60 mg by mouth once daily   latanoprost 0.005 % ophthalmic solution Commonly known as:  XALATAN Place 1 drop into both eyes at bedtime.   losartan 50 MG tablet Commonly known as:  COZAAR Take 50 mg by mouth daily.   oxyCODONE 5 MG immediate release tablet Commonly known  as:  Oxy IR/ROXICODONE Take 1 tablet (5 mg total) by mouth every 4 (four) hours as needed for severe pain.      Follow-up Information    Melrose Nakayama, MD Follow up on 03/14/2018.   Specialty:  Cardiothoracic Surgery Why:  Appointment is at 2:30, please get CXR at 2:00 at [facility] located on first floor of our office building Contact information: [address] [place] [address] [phone]        Chesley Noon, MD. Call in 1 day(s).   Specialty:  Family Medicine Contact information: [place] [address] [phone]           Signed: Elgie Collard 02/11/2018, 11:15 AM

## 2018-02-10 NOTE — Progress Notes (Addendum)
      [address]       [place] [address]             [phone]      2 Days Post-Op Procedure(s) (LRB): VIDEO BRONCHOSCOPY (N/A) VIDEO ASSISTED THORACOSCOPY (Left) LUNG BIOPSY (Left)   Subjective:  No new complaints.  States she was able to sleep well last night.  Denies N/V tolerating oral intake  + BM + ambulation  Objective: Vital signs in last 24 hours: Temp:  [98 F (36.7 C)-98.5 F (36.9 C)] 98.5 F (36.9 C) (05/17 0330) Pulse Rate:  [68-81] 69 (05/16 1600) Cardiac Rhythm: Normal sinus rhythm (05/17 0420) Resp:  [15-27] 16 (05/17 0420) BP: (130-152)/(62-93) 142/93 (05/17 0330) SpO2:  [92 %-98 %] 93 % (05/17 0420) Arterial Line BP: (176)/(71) 176/71 (05/16 0800)  Intake/Output from previous day: 05/16 0701 - 05/17 0700 In: 1981.7 [P.O.:960; I.V.:1021.7] Out: 1587 [Urine:1500; Chest Tube:87]  General appearance: alert, cooperative and no distress Heart: regular rate and rhythm Lungs: wheezes bilaterally and mild Abdomen: soft, non-tender; bowel sounds normal; no masses,  no organomegaly Extremities: extremities normal, atraumatic, no cyanosis or edema Wound: clean and dry  Lab Results: Recent Labs    02/09/18 0418 02/10/18 0430  WBC 9.6 8.9  HGB 11.3* 11.1*  HCT 35.2* 35.3*  PLT 333 357   BMET:  Recent Labs    02/09/18 0418 02/10/18 0430  NA 139 140  K 3.5 3.4*  CL 107 105  CO2 25 27  GLUCOSE 105* 93  BUN 6 10  CREATININE 0.68 0.85  CALCIUM 8.1* 8.3*    PT/INR:  Recent Labs    02/07/18 1504  LABPROT 14.2  INR 1.11   ABG    Component Value Date/Time   PHART 7.401 02/09/2018 0350   HCO3 26.0 02/09/2018 0350   O2SAT 98.8 02/09/2018 0350   CBG (last 3)  No results for input(s): GLUCAP in the last 72 hours.  Assessment/Plan: S/P Procedure(s) (LRB): VIDEO BRONCHOSCOPY (N/A) VIDEO ASSISTED THORACOSCOPY (Left) LUNG BIOPSY (Left)  1. Chest tube- minimal output, no air leak on water- will d/c chest tube today 2.  Pulm- ILD, continue bronchodilators, IS 3. CV- NSR, BP okay- continue home Cozaar 4. D/C Foley 5. D/C PCA, IV Fluids 6. Dispo- patient stable, d/c chest tube today, repeat CXR in AM, continue aggressive Pulm Toilet, if remains stable possibly for d/c in AM   LOS: 2 days    Ellwood Handler 02/10/2018 Patient seen and examined, agree with above  Remo Lipps C. Roxan Hockey, MD [facility] [phone]

## 2018-02-10 NOTE — Discharge Instructions (Signed)
Discharge Instructions: ° °1. You may shower, please wash incisions daily with soap and water and keep dry.  If you wish to cover wounds with dressing you may do so but please keep clean and change daily.  No tub baths or swimming until incisions have completely healed.  If your incisions become red or develop any drainage please call our office at [phone] ° °2. No Driving until  you are no longer using narcotic pain medications ° °3. Fever of 101.5 for at least 24 hours with no source, please contact our office at [phone] ° °4. Activity- up as tolerated, please walk at least 3 times per day.  Avoid strenuous activity ° °5. If any questions or concerns arise, please do not hesitate to contact our office at [phone] ° °

## 2018-02-10 NOTE — Plan of Care (Signed)
  Problem: Education: Goal: Knowledge of General Education information will improve Outcome: Progressing   Problem: Activity: Goal: Risk for activity intolerance will decrease Outcome: Progressing   Problem: Pain Managment: Goal: General experience of comfort will improve Outcome: Progressing   Problem: Skin Integrity: Goal: Risk for impaired skin integrity will decrease Outcome: Progressing

## 2018-02-10 NOTE — Progress Notes (Signed)
78ml Fentanyl from PCA wasted in sink.  Witnessed by Montel Clock, RN.

## 2018-02-11 ENCOUNTER — Inpatient Hospital Stay (HOSPITAL_COMMUNITY): Payer: Medicare Other

## 2018-02-11 MED ORDER — ACETAMINOPHEN 500 MG PO TABS: 1000.0000 mg | ORAL_TABLET | Freq: Four times a day (QID) | ORAL | 0 refills | Status: DC

## 2018-02-11 MED ORDER — OXYCODONE HCL 5 MG PO TABS: 5.0000 mg | ORAL_TABLET | ORAL | 0 refills | Status: DC | PRN

## 2018-02-11 NOTE — Progress Notes (Signed)
Pt discharged home with son.  Discharge instructions reviewed with pt and pt verbalizes understanding.  Questions answered appropriately with no verbalization of additional questions.  Surgical dressing changed per order prior to discharge.  IV and tele discontinued.  Pt discharged via wheelchair to car with son.

## 2018-02-11 NOTE — Progress Notes (Signed)
Patient ID: Sharon [name], female   DOB: [birth year], 67 y.o.   MRN: [id number]      [address]       [place] [address]             [phone]                 3 Days Post-Op Procedure(s) (LRB): VIDEO BRONCHOSCOPY (N/A) VIDEO ASSISTED THORACOSCOPY (Left) LUNG BIOPSY (Left)  LOS: 3 days   Subjective: Patient feels well this morning would like to go home  Objective: Vital signs in last 24 hours: Patient Vitals for the past 24 hrs:  BP Temp Temp src Pulse Resp SpO2  02/11/18 0749 (!) 145/84 98.6 F (37 C) Oral 73 20 94 %  02/11/18 0722 - - - 75 19 96 %  02/11/18 0300 (!) 148/73 98 F (36.7 C) Oral 69 13 95 %  02/10/18 2353 (!) 157/62 98 F (36.7 C) Oral 73 16 93 %  02/10/18 1946 109/82 98.3 F (36.8 C) Oral 75 20 95 %  02/10/18 1511 (!) 154/71 98.5 F (36.9 C) Oral 62 20 98 %  02/10/18 1119 (!) 155/68 98.4 F (36.9 C) Oral 69 19 100 %    Filed Weights   02/08/18 0654 02/08/18 1617  Weight: 149 lb (67.6 kg) 173 lb 11.6 oz (78.8 kg)    Hemodynamic parameters for last 24 hours:    Intake/Output from previous day: 05/17 0701 - 05/18 0700 In: 480 [P.O.:480] Out: 531 [Urine:525; Chest Tube:6] Intake/Output this shift: No intake/output data recorded.  Scheduled Meds: . acetaminophen  1,000 mg Oral Q6H   Or  . acetaminophen (TYLENOL) oral liquid 160 mg/5 mL  1,000 mg Oral Q6H  . aspirin EC  81 mg Oral Daily  . atorvastatin  40 mg Oral Daily  . bisacodyl  10 mg Oral Daily  . clopidogrel  75 mg Oral Daily  . diclofenac  75 mg Oral Daily  . DULoxetine  60 mg Oral Daily  . enoxaparin (LOVENOX) injection  30 mg Subcutaneous Q12H  . latanoprost  1 drop Both Eyes QHS  . losartan  50 mg Oral Daily  . mouth rinse  15 mL Mouth Rinse BID  . mometasone-formoterol  2 puff Inhalation BID  . senna-docusate  1 tablet Oral QHS   Continuous Infusions: . potassium chloride     PRN Meds:.albuterol, ALPRAZolam, ondansetron (ZOFRAN) IV, oxyCODONE, potassium  chloride, traMADol  General appearance: alert and cooperative Neurologic: intact Heart: regular rate and rhythm, S1, S2 normal, no murmur, click, rub or gallop Lungs: clear to auscultation bilaterally Abdomen: soft, non-tender; bowel sounds normal; no masses,  no organomegaly Wound: Left chest incisions healing well  Lab Results: CBC: Recent Labs    02/09/18 0418 02/10/18 0430  WBC 9.6 8.9  HGB 11.3* 11.1*  HCT 35.2* 35.3*  PLT 333 357   BMET:  Recent Labs    02/09/18 0418 02/10/18 0430  NA 139 140  K 3.5 3.4*  CL 107 105  CO2 25 27  GLUCOSE 105* 93  BUN 6 10  CREATININE 0.68 0.85  CALCIUM 8.1* 8.3*    PT/INR: No results for input(s): LABPROT, INR in the last 72 hours.   Radiology Dg Chest 2 View  Result Date: 02/11/2018 CLINICAL DATA:  Interstitial lung disease. EXAM: CHEST - 2 VIEW COMPARISON:  02/10/2018 FINDINGS: Lungs are adequately inflated with surgical suture lines over the left apex, perihilar region and mid to lower lung. Mild  linear density associated with the surgical changes likely atelectasis. No evidence of effusion. Suggestion of tiny persistent right apical pneumothorax with slight improvement. Right lung is clear. Remainder of the exam is unchanged. IMPRESSION: Postsurgical changes left lung. Persistent tiny left apical pneumothorax with slight interval improvement. Electronically Signed   By: Marin Olp M.D.   On: 02/11/2018 09:24   Dg Chest Port 1 View  Result Date: 02/10/2018 CLINICAL DATA:  Chest tube removal. EXAM: PORTABLE CHEST 1 VIEW COMPARISON:  02/10/2018. FINDINGS: Interval removal of left chest tube. Small left pneumothorax. Left chest wall subcutaneous emphysema again noted. Right IJ line noted in stable position. Postsurgical changes left lung. Stable cardiomegaly. No acute bony abnormality. IMPRESSION: 1. Interval removal of left chest tube. Small pneumothorax. Stable left chest wall subcutaneous emphysema. 2.  Right IJ line stable  position. 3.  Postsurgical changes left lung with atelectatic changes noted. Critical Value/emergent results were called by telephone at the time of interpretation on 02/10/2018 at 10:07 am to nurse [facility], who verbally acknowledged these results. Electronically Signed   By: Marcello Moores  Register   On: 02/10/2018 10:08   Dg Chest Port 1 View  Result Date: 02/10/2018 CLINICAL DATA:  Infiltrate EXAM: PORTABLE CHEST 1 VIEW COMPARISON:  Feb 09, 2018 FINDINGS: There is a chest tube on the left. There is subcutaneous air on the left, but no pneumothorax evident. Central catheter tip is in the superior vena cava. Postoperative changes noted on the left. Focal consolidation in the left upper lobe and left lower lobe remain. Right lung is clear. Heart is upper normal in size with pulmonary vascularity within normal limits. No adenopathy. There is aortic atherosclerosis. No bone lesions. IMPRESSION: Tube and catheter positions as described. Subcutaneous air noted on the left, but no pneumothorax evident. Areas of airspace opacity concerning for potential pneumonia left upper lobe and left lower lobe regions, stable. Right lung remains clear. No new opacity. Stable cardiac silhouette. There is aortic atherosclerosis. Aortic Atherosclerosis (ICD10-I70.0). Electronically Signed   By: Lowella Grip III M.D.   On: 02/10/2018 07:41     Assessment/Plan: S/P Procedure(s) (LRB): VIDEO BRONCHOSCOPY (N/A) VIDEO ASSISTED THORACOSCOPY (Left) LUNG BIOPSY (Left) Mobilize Plan for discharge: see discharge orders Patient will contact pulmonary service for follow-up concerning her interstitial lung disease Will be given follow-up to see Dr. Roxan Hockey for postop chest x-ray  Grace Isaac MD 02/11/2018 10:15 AM

## 2018-02-13 LAB — AEROBIC/ANAEROBIC CULTURE (SURGICAL/DEEP WOUND): CULTURE: NO GROWTH

## 2018-02-13 LAB — AEROBIC/ANAEROBIC CULTURE W GRAM STAIN (SURGICAL/DEEP WOUND)

## 2018-02-16 ENCOUNTER — Encounter: Payer: Self-pay | Admitting: Internal Medicine

## 2018-02-21 NOTE — Telephone Encounter (Signed)
D/w Deniece Ree - bx done 02/08/18. Result not in epic yet. HAve sent email to pathologist Dr Jaquita Folds.   TriagE: please give appt to see me next 1-2 weeks. Prefer ILD clinic slot (tue/thu afternoon) but any 30 min slot fine  Thanks  Dr. Brand Males, M.D., [facility].C.P Pulmonary and Critical Care Medicine Staff Physician, [place] Director - Interstitial Lung Disease  Program  Pulmonary [place] at [place], [place], [address]  Pager: [phone], If no answer or between  15:00h - 7:00h: call [phone] Telephone: [phone]

## 2018-02-22 NOTE — Telephone Encounter (Signed)
  Dr Terrance Mass pathologist emailed me 02/22/2018 AM - Let patient know the "diagnosis is NSIP a type of fibrosis". To discuss this in detail need face to face - and best you give her a 30 min appt- ILD clinic tue/thu pm prefrred - if not any 30 minute including 02/23/18 AM which I just opened up     Hi Sharon [name],  I am sorry for the delay on this case.     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 See note     NOTE:  Sections show patchy, mild interstitial fibrosis with mild mixed inflammatory infiltrates composed primarily of lymphocytes and plasma cells, with multiple lymphoid aggregates. The interstitial fibrosing and chronic inflammatory process is patchy, but relatively temporally homogeneous and most suggestive of an NSIP-like process, fibrosing type and to a lesser degree, cellular type. Scattered areas of intra-alveolar fibrin are present. There is also prominent, peribronchial inflammation with dense eosinophils, lymphoid aggregates, neutrophils and intraluminal mucin plugs. Granulomas are not identified. Although there is early, microscopic honeycomb change in the left upper lobe, there is no significant architectural distortion. Fibroblastic foci are not identified. This pattern of injury (NSIP) is reported in lungs of patients with collagen vascular disease, autoimmune disease, exposures to toxins or immunogens, drug reactions, chronic hypersensitivity pneumonitis, resolving (fibrotic) diffuse alveolar damage or can be an idiopathic disease. Because there is concomitant, scattered lymphoid hyperplasia, overall, the features can be suggestive of a collagen vascular disease, such as rheumatoid arthritis. The significance of patchy, though prominent, peribronchial eosinophilic infiltrates is uncertain, but may suggest concurrent allergic process        Let me know if you have any questions      Corliss Blacker

## 2018-02-23 ENCOUNTER — Ambulatory Visit (INDEPENDENT_AMBULATORY_CARE_PROVIDER_SITE_OTHER): Payer: Medicare Other | Admitting: Internal Medicine

## 2018-02-23 ENCOUNTER — Telehealth: Payer: Self-pay | Admitting: Internal Medicine

## 2018-02-23 ENCOUNTER — Encounter: Payer: Self-pay | Admitting: Internal Medicine

## 2018-02-23 VITALS — BP 128/70 | HR 69 | Ht 62.0 in | Wt 152.4 lb

## 2018-02-23 DIAGNOSIS — J8489 Other specified interstitial pulmonary diseases: Secondary | ICD-10-CM | POA: Diagnosis not present

## 2018-02-23 DIAGNOSIS — J849 Interstitial pulmonary disease, unspecified: Secondary | ICD-10-CM | POA: Diagnosis not present

## 2018-02-23 MED ORDER — PREDNISONE 10 MG PO TABS: ORAL_TABLET | ORAL | 0 refills | Status: DC

## 2018-02-23 NOTE — Progress Notes (Signed)
Subjective:     Patient ID: Sharon [name], female   DOB: [birth year], 67 y.o.   MRN: [id number]  HPI     OV 11/01/2017 - ILD clinic   - transfer of care and 2nd opinion  Chief Complaint  Patient presents with  . Advice Only    Referred by Dr. Melford [name] due to an abnormal CT.  Pt does have complaints of a dry cough and SOB with exertion or if has a coughing episode.    67 year old female referred by the primary care physician to the ILD clinic for evaluation of her pulmonary problems with symptoms of cough and shortness of breath.  According to the patient for a better part of 2 years she has had episodic cough particularly in the fall season.  She says she is active in the outdoors doing gardening and cutting and blowing and mowing and she usually wears a mask but nevertheless she will get a bronchitis episode that will require nebulizers and prednisone to resolve.  Symptoms will usually resolve over 2-3 weeks.  However this time around it is taken 8 weeks to resolve.  Last prednisone was over a week or 2 ago.  She still has some ongoing wheezing.  In the background of this episodic cough she says between episodes she does not have any cough but she does have some baseline shortness of breath when she climbs a flight of stairs this is been stable and is of insidious onset also present for 2 years.  She did have a CT scan of the chest approximately 9 months ago in May 2018 that shows in my personal opinion based upon my personal visualization bilateral subpleural reticulation particularly in the upper lobes and some subpleural reticulation in the left lower lobe.  There is no clear distinct craniocaudal gradient in my opinion this would be indeterminate for UIP without a clear alternate etiology.  Given the presence of ILD findings she has been sent to the ILD clinic.  Review of the chart also shows she is seen.my colleague Dr. Melvyn [name] for asthma symptoms and is been placed on Symbicort which she takes 1  puff twice daily.  The exam nitric oxide a week after prednisone and while on Symbicort is elevated to near abnormal levels at 48 ppb today  [employer] of chest physicians interstitial lung disease questionnaire -Past medical history: Positive for allergies.  IgE was elevated to 168 approximately a year or 2 ago on chart review.  In addition she gives a history of rheumatoid arthritis diagnosed many many years ago.  Seen by Dr. Keturah [name] at that time.  She is only followed up with nonsteroidal anti-inflammatory drugs.  She has not followed up with Dr. Keturah [name] in many years.  She feels she needs to go back.  However in May 2017 her autoimmune limited profile done here was negative.  -Personal exposure history: She has smoked marijuana in the past.  She smokes cigarettes from age 72 to age 36 a pack a day and quit.  -Family history of lung disease: Sister Sharon [name] who lives in [place] apparently has had a lung biopsy and has been diagnosed with sarcoidosis recently.  She is also a smoker.  Father died of congestive heart failure.  There is no diagnosis of pulmonary fibrosis or hypersensitivity pneumonitis  -Home exposure history: Has not lived in a house that is old in the last 10 years.  There is no humidifier or insomnia or hot tub or Jacuzzi or water damage or  mold.  She has 1 dog.  She does not have any birds.  She does not use any feathered pillows.  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 Range 02/03/2016 11:20  Anit Nuclear Antibody(ANA) Latest Ref  Range: NEGATIVE  NEG  Cyclic Citrullin Peptide Ab Latest Units: Units <16  RA Latex Turbid. Latest Ref Range: <=14 IU/mL <10    Walking desaturation test on 11/01/2017 185 feet x 3 laps on ROOM AIR:  did not desaturate. Rest pulse ox was 100%, final pulse ox was 97%. HR response 88/min at rest to 100/min at peak exertion. Patient Sharon [name]  Did not Desaturate < 88% . Sharon [name] yes did  Desaturated </= 3% points. Sharon [name] yes did get tachyardic   FeNO - 48ppb and almost abnormal   OV 12/13/2017  Chief Complaint  Patient presents with  . Follow-up    PFT done today. Pt states after last visit on 11/01/17, she developed bronchitis and had it x2 weeks. Pt states she still has a mild cough. Denies any SOB or CP.   Follow-up interstitial lung disease with asthma/allergy phenotype  She returns for follow-up after investigations.  [name] who is well-known to me through working to medical ICU where she is a Equities trader.  We did a history read taken patient tells me that she moved from [place] to [place] many years ago.  She does regular gardening and works in the yard [employer].  Many times the leads are damp and there is she suspects mold in it.  She also burns the lesion is exposed to the smoke.  Symbicort is helping but approximately 2 weeks ago had respiratory exacerbation that was not related to gardening [in fact she has not gotten this whole year] and ended up in the ER treated with antibiotics and prednisone.  Currently back to baseline.  She had pulmonary function test today that shows mixed obstruction and restriction associated with flow volume loop abnormalities.  She had a hypersensitivity pneumonitis panel documented below that is negative.  She had limited autoimmune panel [the CMA at last visit did not order the full panel] and this was normal.  She had repeat CT scan of the chest read by thoracic radiology I personally  visualized this and agree with the findings.  It is indeterminate for UIP and there is no specific alternate pattern.  No  air trapping is described.  The differential diagnosis appears to be broad.     Results for GENEVIE, ELMAN (MRN [id number]) as of 12/13/2017 09:57  Ref. Range 11/01/2017 16:22  Faenia retivirgula Latest Ref Range: NEGATIVE  NEGATIVE  S. VIRIDIS Latest Ref Range: NEGATIVE  NEGATIVE  T. CANDIDUS Latest Ref Range: NEGATIVE  NEGATIVE  T. VULGARIS Latest Ref Range: NEGATIVE  NEGATIVE    Results for ZENIA, GUEST (MRN [id number]) as of 12/13/2017 09:57  Ref. Range 11/01/2017 16:22  Anit Nuclear Antibody(ANA) Latest Ref Range: NEGATIVE  NEGATIVE  Angiotensin-Converting Enzyme Latest Ref Range: 9 - 67 U/L 25  Cyclic Citrullin Peptide Ab Latest Units: UNITS <16  ds DNA Ab Latest Units: IU/mL <1  RA Latex Turbid. Latest Ref Range: <14 IU/mL <14  IgE (Immunoglobulin E), Serum Latest Ref Range: <OR=114 kU/L 252 (H)    IMPRESSION: CT FEb 2019 1. Pulmonary parenchymal pattern of fibrotic interstitial lung disease is unchanged from 02/07/2017 and may be due to nonspecific interstitial pneumonitis or mild chronic hypersensitivity pneumonitis. Findings are not consistent with usual interstitial pneumonitis. 2. Aortic atherosclerosis (ICD10-170.0). Coronary artery calcification.   Electronically Signed   By: Lorin Picket M.D.   On: 11/14/2017 12:56   OV 02/23/2018  Chief Complaint  Patient presents with  . Follow-up    Pt had lung biopsy done and states she is doing good since then. States only time she has pain is when she sneezes. Denies any current complaints of cough, SOB, or CP.    Here to review SLB results -slides have been read by our resident pathologist with whom I discussed via email.  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  In addition he did see some eosinophilic infiltrates in the  interstitium.  This can explain the high nitric oxide that she has.  She is here with her husband.  Since surgery she is doing well.  She only has some pain in her chest when she sneezes at the postsurgical site.  She still has one suture hanging out which Dr. Roxan Hockey said was okay to remove and my LPN removed it.  But overall he she and her husband are here to discuss the pathology report.  They want to know prognosis and implications.  Her grandchild is now 24 days old and she wants to spend a lot of time taking care of the grandchild.  She is aware of prednisone side effects.  Her husband says that her mood might change because of prednisone but patient herself says she has tolerated prednisone just fine in the past.     Results for ERSIE, SAVINO (MRN [id number]) as of 12/13/2017 09:57  Ref. Range 12/13/2017 09:03  FVC-Pre Latest Units: L 1.65  FVC-%Pred-Pre Latest Units: % 58  FEV1-Pre Latest Units: L 1.39  FEV1-%Pred-Pre Latest Units: % 64  Pre FEV1/FVC ratio Latest Units: % 85  Results for JUNIPER, COBEY (MRN [id number]) as of 12/13/2017 09:57  Ref. Range 12/13/2017 09:03  FEV1-%Change-Post Latest Units: % 26  Results for FREDDYE, CARDAMONE (MRN [id number]) as of 12/13/2017 09:57  Ref. Range 12/13/2017 09:03  DLCO cor Latest Units: ml/min/mmHg 14.35  DLCO cor % pred Latest Units: % 68     Simple office walk 185 feet x  3 laps goal with forehead probe 02/23/2018   O2 used Room air  Number laps completed 3  Comments about pace slow  Resting Pulse Ox/HR 100% and 71/min  Final Pulse Ox/HR 99% and 89/min  Desaturated </= 88% no  Desaturated <= 3% points no  Got Tachycardic >/= 90/min no  Symptoms at end of test Mild dyspnea  Miscellaneous comments x      has a past medical history of Anemia, Chronic major depressive disorder, DDD (degenerative disc disease), cervical, Depression, Dyspnea, Hypertension, Interstitial lung disease ([place]), Mixed hyperlipidemia, Pain in joints of both  feet, Pneumonia (2016), Polyarthropathy, Stroke ([place]), Vision abnormalities, and Vitamin D deficiency.   reports that she quit smoking about 39 years ago. Her smoking use included cigarettes. She has a 30.00 pack-year smoking history. She has never used  smokeless tobacco.  Past Surgical History:  Procedure Laterality Date  . CARPAL TUNNEL RELEASE    . CESAREAN SECTION     x2  . COLONOSCOPY    . LUNG BIOPSY Left 02/08/2018   Procedure: LUNG BIOPSY;  Surgeon: Melrose Nakayama, MD;  Location: [facility];  Service: Thoracic;  Laterality: Left;  . right arm tendon surgery    . TAYLOR BUNIONECTOMY     2 on one foot and 1 on the other foot  . VIDEO ASSISTED THORACOSCOPY Left 02/08/2018   Procedure: VIDEO ASSISTED THORACOSCOPY;  Surgeon: Melrose Nakayama, MD;  Location: [facility];  Service: Thoracic;  Laterality: Left;  Marland Kitchen VIDEO BRONCHOSCOPY N/A 02/08/2018   Procedure: VIDEO BRONCHOSCOPY;  Surgeon: Melrose Nakayama, MD;  Location: [facility] OR;  Service: Thoracic;  Laterality: N/A;    Allergies  Allergen Reactions  . Codeine Nausea Only    GI upset     Immunization History  Administered Date(s) Administered  . Influenza, High Dose Seasonal PF 10/25/2017  . Influenza-Unspecified 09/27/2013  . Pneumococcal Conjugate-13 10/25/2017    Family History  Problem Relation Age of Onset  . Emphysema Father   . Asthma Father   . Congestive Heart Failure Father   . Stroke Father   . Asthma Sister   . COPD Sister   . Stroke Mother      Current Outpatient Medications:  .  acetaminophen (TYLENOL) 500 MG tablet, Take 2 tablets (1,000 mg total) by mouth every 6 (six) hours., Disp: 30 tablet, Rfl: 0 .  albuterol (PROVENTIL HFA;VENTOLIN HFA) 108 (90 BASE) MCG/ACT inhaler, Inhale 2 puffs into the lungs every 4 (four) hours as needed for wheezing or shortness of breath., Disp: , Rfl:  .  ALPRAZolam (XANAX) 0.5 MG tablet, Take 0.5 mg by mouth daily as needed for anxiety. , Disp: , Rfl:  .  aspirin EC  81 MG tablet, Take 81 mg by mouth daily., Disp: , Rfl:  .  atorvastatin (LIPITOR) 40 MG tablet, Take 1 tablet (40 mg total) by mouth daily., Disp: 90 tablet, Rfl: 1 .  budesonide-formoterol (SYMBICORT) 80-4.5 MCG/ACT inhaler, Take 2 puffs first thing in am and then another 2 puffs about 12 hours later., Disp: 1 Inhaler, Rfl: 12 .  calcium carbonate (TUMS EX) 750 MG chewable tablet, Chew 1 tablet by mouth daily as needed for heartburn., Disp: , Rfl:  .  clopidogrel (PLAVIX) 75 MG tablet, Take 1 tablet (75 mg total) by mouth daily., Disp: 21 tablet, Rfl: 0 .  dextromethorphan-guaiFENesin (MUCINEX DM) 30-600 MG per 12 hr tablet, Take 1 tablet by mouth 2 (two) times daily as needed (congestion). , Disp: , Rfl:  .  diclofenac (VOLTAREN) 75 MG EC tablet, Take 75 mg by mouth daily., Disp: , Rfl:  .  DULoxetine (CYMBALTA) 30 MG capsule, Take 60 mg by mouth once daily, Disp: , Rfl:  .  latanoprost (XALATAN) 0.005 % ophthalmic solution, Place 1 drop into both eyes at bedtime., Disp: , Rfl:  .  losartan (COZAAR) 50 MG tablet, Take 50 mg by mouth daily. , Disp: , Rfl:  .  oxyCODONE (OXY IR/ROXICODONE) 5 MG immediate release tablet, Take 1 tablet (5 mg total) by mouth every 4 (four) hours as needed for severe pain., Disp: 15 tablet, Rfl: 0 .  traMADol (ULTRAM) 50 MG tablet, Take by mouth., Disp: , Rfl:     Review of Systems     Objective:   Physical Exam Vitals:   02/23/18 1204  BP: 128/70  Pulse: 69  SpO2: 98%  Weight: 152 lb 6.4 oz (69.1 kg)  Height: 5\' 2"  (1.575 m)    Estimated body mass index is 27.87 kg/m as calculated from the following:   Height as of this encounter: 5\' 2"  (1.575 m).   Weight as of this encounter: 152 lb 6.4 oz (69.1 kg).     Assessment:       ICD-10-CM   1. ILD (interstitial lung disease) ([place]) J84.9   2. 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[place]) J84.89         Plan:      You have NSIP mixed cellular and fibrotic variety - - - idiopathich. More common  in women. Slower progression than IPF Mixed cellularity indicates potential for partial response/improvement Prednisone is first line and requires 3-12 months of treatment We discussed prednisone side effects extensively and you have agreed to start this We discussed enrollment in research trial at [place] - ofev v placebop We discussed beneficial effects of pulmonary rehab  Plan -start prednisone 60mg  daily x 2 weeks, then 50mg  daily x 2 weeks and then 40mg  daily to continue - I have emailed Dr Wynn Maudlin to see if ofev v placebo trial is ongoing   - if so, will refer you to them - hold off rehab at this point due to new grandbaby - in 4-6 weeks do Pre-bd spiro and dlco only. No lung volume or bd response. No post-bd spiro  Followup 4-6 weeks in ILD clinic but after spirometry   > 50% of this > 25 min visit spent in face to face counseling or coordination of care    Dr. Brand Males, M.D., [facility].C.P Pulmonary and Critical Care Medicine Staff Physician, [place] Director - Interstitial Lung Disease  Program  Pulmonary [place] at [place], [place], [address]  Pager: [phone], If no answer or between  15:00h - 7:00h: call [phone] Telephone: [phone]

## 2018-02-23 NOTE — Telephone Encounter (Signed)
Let Deniece Ree that [facility] Dr Wynn Maudlin got back to me. The ofev v placebo trial for non-IPF diseas such as NSIP filled quickly. Results are being analyzed. We will know next 12 months or so. Hopefully it works and she qualifies for it    Dr. Brand Males, M.D., [facility].C.P Pulmonary and Critical Care Medicine Staff Physician, [place] Director - Interstitial Lung Disease  Program  Pulmonary [place] at [place], [place], [address]  Pager: [phone], If no answer or between  15:00h - 7:00h: call [phone] Telephone: [phone]

## 2018-02-23 NOTE — Patient Instructions (Addendum)
ICD-10-CM   1. ILD (interstitial lung disease) ([place]) J84.9   2. 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 More common in women. Slower progression than IPF Mixed cellularity indicates potential for partial response/improvement Prednisone is first line and requires 3-12 months of treatment We discussed prednisone side effects extensively and you have agreed to start this We discussed enrollment in research trial at [facility] - ofev v placebop We discussed beneficial effects of pulmonary rehab  Plan -start prednisone 60mg  daily x 2 weeks, then 50mg  daily x 2 weeks and then 40mg  daily to continue - I have emailed Dr Wynn Maudlin to see if ofev v placebo trial is ongoing   - if so, will refer you to them - hold off rehab at this point due to new grandbaby - in 4-6 weeks do Pre-bd spiro and dlco only. No lung volume or bd response. No post-bd spiro  Followup 4-6 weeks in ILD clinic but after spirometry

## 2018-02-27 ENCOUNTER — Encounter: Payer: Self-pay | Admitting: Neurology

## 2018-02-27 ENCOUNTER — Telehealth: Payer: Self-pay | Admitting: Neurology

## 2018-02-27 ENCOUNTER — Ambulatory Visit (INDEPENDENT_AMBULATORY_CARE_PROVIDER_SITE_OTHER): Payer: Medicare Other | Admitting: Neurology

## 2018-02-27 VITALS — BP 115/74 | HR 86 | Ht 62.0 in | Wt 155.7 lb

## 2018-02-27 DIAGNOSIS — G459 Transient cerebral ischemic attack, unspecified: Secondary | ICD-10-CM

## 2018-02-27 NOTE — Telephone Encounter (Signed)
Patient states she will call us back to schedule one year follow-up w/ Janett Billow, NP per Dr. Leonie Man.

## 2018-02-27 NOTE — Patient Instructions (Signed)
I had a long discussion with the patient and her husband regarding her posterior circulation TIA and recommend she continue aspirin for stroke prevention and maintain strict control of hypertension with blood pressure goal below 130/90   lipids with LDL cholesterol goal below 7 0 mg percent and diabetes with hemoglobin A1c goal below 6.5%.  She was also encouraged to eat a healthy diet with lots of fruits, vegetables, whole grains and cereals and to be active and lose weight.  She was complemented on quitting smoking cigarettes and marijuana.  She will return for follow-up in the future in a year or call earlier if necessary

## 2018-02-27 NOTE — Progress Notes (Signed)
[facility] [address] [place]. [place] [address] [phone]       OFFICE CONSULT NOTE  Sharon. Sharon [name] Date of Birth:  Dec 02, 1950 Medical Record Number:  [id number]   Referring MD:  Vicenta Aly, NP Reason for Referral:  TIA [id number] consult 12/06/2017 :  Sharon [name] is a 67 year pleasant Caucasian lady who is accompanied today by her husband. History is obtained from them as well as review of referral notes sent by primary physician as well as review in care everywhere. I have personally reviewed MRI  imaging films which was sent on a disk. She states that on 11/11/17 she was watching TV when she noticed sudden onset of vertigo with the room spinning and trouble speaking with nonfluent speech which is slightly slurred. She is able to communicating speak to her husband. She was aware of her surroundings. She denied any accompanying nausea vomiting or loss of vision. The husband called 81 but by the time they arrived patient was feeling better. She refused to go to the hospital. She did feel it drained and tired for the rest of the day. She did not see her primary physician right away but eventually when she saw she was referred for an outpatient MRI that shows done on 11/28/17 which are personally reviewed shows only mild changes of chronic microvascular ischemia without acute abnormality. Carotid ultrasound also done on 11/28/17 shows no significant extracranial stenosis. Last lab work for cholesterol was on 09/24/17 which had shown an LDL cholesterol of 167 mg percent. She is not a diabetic and on her sugars including recent ones have been satisfactory Patient was on Lipitor 20 the dose of which was increased by primary physician to 40 mg daily. She has also been started on aspirin 81 mg daily she has been taking and is tolerating both medications without any side effects. Patient denies any prior history of strokes or TIAs but does have a family history of strokes and was  sister and mother. She states she's been eating healthy and is trying to lose weight. She has no prior history of neurological problems. She states her blood pressure is usually well controlled and today it is borderline at 137/85. Update 02/27/2018 : She returns for follow-up after initial consultation to end of months ago.  She is doing well and has not had any recurrent TIA or strokelike symptoms.  She had outpatient echocardiogram done on 12/12/2017 which was normal.  MRA of the brain and neck which have personally reviewed the images done on 12/13/2017 shows no significant extracranial stenosis.  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 Roxan Hockey 3 weeks ago.  She is seeing Dr. Chase Caller for this and was recently started on high-dose prednisone.  She states she is tolerating aspirin well without bruising or bleeding and has stopped the Plavix as instructed by me.  She states her blood pressure is well controlled today it is 115/74.  She remains on Lipitor which is tolerating well without muscle aches and pains.   ROS:   14 system review of systems is positive for  No complaints today and all other systems negative PMH:  Past Medical History:  Diagnosis Date  . Anemia    'years ago"  . Chronic major depressive disorder   . DDD (degenerative disc disease), cervical   . Depression   . Dyspnea   .  Hypertension   . Interstitial lung disease ([place])   . Mixed hyperlipidemia   . Pain in joints of both feet   . Pneumonia 2016  . Polyarthropathy   . Stroke [facility])    TIA 11/11/17  . Vision abnormalities    catracts left eye  . Vitamin D deficiency     Social History:  Social History   Socioeconomic History  . Marital status: Married    Spouse name: Not on file  . Number of children: Not on file  . Years of education: Not on  file  . Highest education level: Not on file  Occupational History  . Not on file  Social Needs  . Financial resource strain: Not on file  . Food insecurity:    Worry: Not on file    Inability: Not on file  . Transportation needs:    Medical: Not on file    Non-medical: Not on file  Tobacco Use  . Smoking status: Former Smoker    Packs/day: 1.00    Years: 30.00    Pack years: 30.00    Types: Cigarettes    Last attempt to quit: 02/03/1979    Years since quitting: 39.0  . Smokeless tobacco: Never Used  Substance and Sexual Activity  . Alcohol use: Yes    Alcohol/week: 0.6 oz    Types: 1 Standard drinks or equivalent per week    Comment: once a month  . Drug use: Not Currently    Frequency: 7.0 times per week    Comment: 10 yrs. to current  . Sexual activity: Not on file  Lifestyle  . Physical activity:    Days per week: Not on file    Minutes per session: Not on file  . Stress: Not on file  Relationships  . Social connections:    Talks on phone: Not on file    Gets together: Not on file    Attends religious service: Not on file    Active member of club or organization: Not on file    Attends meetings of clubs or organizations: Not on file    Relationship status: Not on file  . Intimate partner violence:    Fear of current or ex partner: Not on file    Emotionally abused: Not on file    Physically abused: Not on file    Forced sexual activity: Not on file  Other Topics Concern  . Not on file  Social History Narrative   Lives with husband.   Homemaker.   2 children    Medications:   Current Outpatient Medications on File Prior to Visit  Medication Sig Dispense Refill  . acetaminophen (TYLENOL) 500 MG tablet Take 2 tablets (1,000 mg total) by mouth every 6 (six) hours. 30 tablet 0  . albuterol (PROVENTIL HFA;VENTOLIN HFA) 108 (90 BASE) MCG/ACT inhaler Inhale 2 puffs into the lungs every 4 (four) hours as needed for wheezing or shortness of breath.    . ALPRAZolam  (XANAX) 0.5 MG tablet Take 0.5 mg by mouth daily as needed for anxiety.     Marland Kitchen aspirin EC 81 MG tablet Take 81 mg by mouth daily.    Marland Kitchen atorvastatin (LIPITOR) 40 MG tablet Take 1 tablet (40 mg total) by mouth daily. 90 tablet 1  . budesonide-formoterol (SYMBICORT) 80-4.5 MCG/ACT inhaler Take 2 puffs first thing in am and then another 2 puffs about 12 hours later. 1 Inhaler 12  . calcium carbonate (TUMS EX) 750 MG chewable tablet Chew  1 tablet by mouth daily as needed for heartburn.    . clopidogrel (PLAVIX) 75 MG tablet Take 1 tablet (75 mg total) by mouth daily. 21 tablet 0  . dextromethorphan-guaiFENesin (MUCINEX DM) 30-600 MG per 12 hr tablet Take 1 tablet by mouth 2 (two) times daily as needed (congestion).     Marland Kitchen diclofenac (VOLTAREN) 75 MG EC tablet Take 75 mg by mouth daily.    . DULoxetine (CYMBALTA) 30 MG capsule Take 60 mg by mouth once daily    . latanoprost (XALATAN) 0.005 % ophthalmic solution Place 1 drop into both eyes at bedtime.    Marland Kitchen losartan (COZAAR) 50 MG tablet Take 50 mg by mouth daily.     Marland Kitchen oxyCODONE (OXY IR/ROXICODONE) 5 MG immediate release tablet Take 1 tablet (5 mg total) by mouth every 4 (four) hours as needed for severe pain. 15 tablet 0  . predniSONE (DELTASONE) 10 MG tablet 60mg x2weeks,50mg x2weeks,then continue on 40mg  daily 180 tablet 0  . traMADol (ULTRAM) 50 MG tablet Take by mouth.     No current facility-administered medications on file prior to visit.     Allergies:   Allergies  Allergen Reactions  . Codeine Nausea Only    GI upset     Physical Exam General: well developed, well nourished middle-age Caucasian lady, seated, in no evident distress Head: head normocephalic and atraumatic.   Neck: supple with no carotid or supraclavicular bruits Cardiovascular: regular rate and rhythm, no murmurs Musculoskeletal: no deformity Skin:  no rash/petichiae Vascular:  Normal pulses all extremities  Neurologic Exam Mental Status: Awake and fully alert.  Oriented to place and time. Recent and remote memory intact. Attention span, concentration and fund of knowledge appropriate. Mood and affect appropriate.  Cranial Nerves: Fundoscopic exam not done. Pupils equal, briskly reactive to light. Extraocular movements full without nystagmus. Visual fields full to confrontation. Hearing intact. Facial sensation intact. Face, tongue, palate moves normally and symmetrically.  Motor: Normal bulk and tone. Normal strength in all tested extremity muscles. Sensory.: intact to touch , pinprick , position and vibratory sensation.  Coordination: Rapid alternating movements normal in all extremities. Finger-to-nose and heel-to-shin performed accurately bilaterally. Gait and Station: Arises from chair without difficulty. Stance is normal. Gait demonstrates normal stride length and balance . Able to heel, toe and tandem walk without difficulty.  Reflexes: 1+ and symmetric. Toes downgoing.      ASSESSMENT: 67 year old Caucasian lady with transient episode of vertigo, dizziness and speech difficulties likely due to a posterior circulation TIA. Vascular risk factors of hyperlipidemia and hypertension only.    PLAN: I had a long discussion with the patient and her husband regarding her posterior circulation TIA and recommend she continue aspirin for stroke prevention and maintain strict control of hypertension with blood pressure goal below 130/90   lipids with LDL cholesterol goal below 7 0 mg percent and diabetes with hemoglobin A1c goal below 6.5%.  She was also encouraged to eat a healthy diet with lots of fruits, vegetables, whole grains and cereals and to be active and lose weight.  She was complemented on quitting smoking cigarettes and marijuana.  She will return for follow-up in the future in a year or call earlier if necessary and answering questions   Antony Contras, MD  [facility] [address] [place], [place]  [address]  Phone [phone] Fax [phone] Note: This document was prepared with digital dictation and possible smart phrase technology. Any transcriptional errors that result from this process are  unintentional.

## 2018-02-28 NOTE — Telephone Encounter (Signed)
I have made Mrs Ghazarian made aware.  Nothing further needed.

## 2018-03-01 LAB — CULTURE, FUNGUS WITHOUT SMEAR

## 2018-03-13 ENCOUNTER — Other Ambulatory Visit: Payer: Self-pay | Admitting: Thoracic Surgery (Cardiothoracic Vascular Surgery)

## 2018-03-13 DIAGNOSIS — J849 Interstitial pulmonary disease, unspecified: Secondary | ICD-10-CM

## 2018-03-14 ENCOUNTER — Encounter: Payer: Self-pay | Admitting: Thoracic Surgery (Cardiothoracic Vascular Surgery)

## 2018-03-14 ENCOUNTER — Ambulatory Visit
Admission: RE | Admit: 2018-03-14 | Discharge: 2018-03-14 | Disposition: A | Discharge status: Home / Self Care | Payer: Medicare Other | Source: Ambulatory Visit | Attending: Thoracic Surgery (Cardiothoracic Vascular Surgery) | Admitting: Thoracic Surgery (Cardiothoracic Vascular Surgery)

## 2018-03-14 ENCOUNTER — Other Ambulatory Visit: Payer: Self-pay

## 2018-03-14 ENCOUNTER — Ambulatory Visit (INDEPENDENT_AMBULATORY_CARE_PROVIDER_SITE_OTHER): Payer: Self-pay | Admitting: Thoracic Surgery (Cardiothoracic Vascular Surgery)

## 2018-03-14 VITALS — BP 115/73 | HR 72 | Resp 16 | Ht 62.0 in | Wt 160.0 lb

## 2018-03-14 DIAGNOSIS — J849 Interstitial pulmonary disease, unspecified: Secondary | ICD-10-CM

## 2018-03-14 DIAGNOSIS — Z09 Encounter for follow-up examination after completed treatment for conditions other than malignant neoplasm: Secondary | ICD-10-CM

## 2018-03-14 DIAGNOSIS — J8489 Other specified interstitial pulmonary diseases: Secondary | ICD-10-CM

## 2018-03-14 NOTE — Progress Notes (Signed)
[address]       [place] [address]             [phone]     HPI: Sharon [name] returns for a scheduled follow-up visit  Sharon [name] is a 67 year old woman with a history of hypertension, hyperlipidemia, polyarthropathy, TIA, pneumonia, and interstitial lung disease.  She had a severe episode of bronchitis back in December.  Each time she was treated she was improved but her symptoms were rapidly recurred.  A CT showed interstitial lung disease.  Dr. Chase Caller referred her for a lung biopsy.  I did a left VATS for lung biopsy on 02/08/2018.  She did well postoperatively and went home on day 3.  She is not having any incisional pain.  Her breathing has improved with the high-dose steroids that she started on.  She has gained some weight from that.  Past Medical History:  Diagnosis Date  . Anemia    'years ago"  . Chronic major depressive disorder   . DDD (degenerative disc disease), cervical   . Depression   . Dyspnea   . Hypertension   . Interstitial lung disease ([place])   . Mixed hyperlipidemia   . Pain in joints of both feet   . Pneumonia 2016  . Polyarthropathy   . Stroke [facility])    TIA 11/11/17  . Vision abnormalities    catracts left eye  . Vitamin D deficiency     Current Outpatient Medications  Medication Sig Dispense Refill  . albuterol (PROVENTIL HFA;VENTOLIN HFA) 108 (90 BASE) MCG/ACT inhaler Inhale 2 puffs into the lungs every 4 (four) hours as needed for wheezing or shortness of breath.    . ALPRAZolam (XANAX) 0.5 MG tablet Take 0.5 mg by mouth daily as needed for anxiety.     Marland Kitchen aspirin EC 81 MG tablet Take 81 mg by mouth daily.    Marland Kitchen atorvastatin (LIPITOR) 40 MG tablet Take 1 tablet (40 mg total) by mouth daily. 90 tablet 1  . budesonide-formoterol (SYMBICORT) 80-4.5 MCG/ACT inhaler Take 2 puffs first thing in am and then another 2 puffs about 12 hours later. 1 Inhaler 12  . calcium carbonate (TUMS EX) 750 MG chewable tablet Chew 1 tablet  by mouth daily as needed for heartburn.    . clopidogrel (PLAVIX) 75 MG tablet Take 1 tablet (75 mg total) by mouth daily. 21 tablet 0  . dextromethorphan-guaiFENesin (MUCINEX DM) 30-600 MG per 12 hr tablet Take 1 tablet by mouth 2 (two) times daily as needed (congestion).     . DULoxetine (CYMBALTA) 30 MG capsule Take 60 mg by mouth once daily    . latanoprost (XALATAN) 0.005 % ophthalmic solution Place 1 drop into both eyes at bedtime.    Marland Kitchen losartan (COZAAR) 50 MG tablet Take 50 mg by mouth daily.     . predniSONE (DELTASONE) 10 MG tablet 60mg x2weeks,50mg x2weeks,then continue on 40mg  daily 180 tablet 0  . traMADol (ULTRAM) 50 MG tablet Take by mouth.     No current facility-administered medications for this visit.     Physical Exam BP 115/73 (BP Location: Right Arm, Patient Position: Sitting, Cuff Size: Large)   Pulse 72   Resp 16   Ht 5\' 2"  (1.575 m)   Wt 160 lb (72.6 kg)   SpO2 97% Comment: ON RA  BMI 29.24 kg/m  67 year old woman in no acute distress Alert and oriented x3 with no focal deficits Lungs clear bilaterally Cardiac regular rate  and rhythm  Diagnostic Tests: CHEST - 2 VIEW  COMPARISON:  PA and lateral chest x-ray of Feb 11, 2018  FINDINGS: The lungs are adequately inflated. There remain coarse lung markings in the left upper and left lower lung but these have become less conspicuous. There is no pneumothorax or pneumomediastinum. The right lung is clear. The heart and pulmonary vascularity are normal. There is calcification in the wall of the aortic arch. The bony thorax exhibits no acute abnormality.  IMPRESSION: Improving interstitial infiltrates or atelectasis in the left lung. No acute pneumonia nor CHF.  Thoracic aortic atherosclerosis.   Electronically Signed   By: David  Martinique M.D.   On: 03/14/2018 14:39 I personally reviewed the chest x-ray and concur with the findings noted above  Impression: Sharon [name] is a 67 year old woman with  interstitial lung disease.  Biopsies showed nonspecific interstitial pneumonia.  She is recovering well from the procedure.  She does not have any incisional pain.  She is driving.  There are no restrictions on her activities  NSIP-started on high-dose prednisone by Dr. Chase Caller  Plan: I will be happy to see Sharon [name] back at any time in the future if I can be of any further assistance with her care  Melrose Nakayama, MD [facility] [phone]

## 2018-03-20 ENCOUNTER — Ambulatory Visit (INDEPENDENT_AMBULATORY_CARE_PROVIDER_SITE_OTHER): Payer: Medicare Other | Admitting: Internal Medicine

## 2018-03-20 DIAGNOSIS — J849 Interstitial pulmonary disease, unspecified: Secondary | ICD-10-CM | POA: Diagnosis not present

## 2018-03-20 LAB — PULMONARY FUNCTION TEST
FEF 25-75 PRE: 2.59 L/s
FEF 25-75 Post: 3.47 L/sec
FEF2575-%CHANGE-POST: 33 %
FEF2575-%PRED-POST: 176 %
FEF2575-%Pred-Pre: 132 %
FEV1-%CHANGE-POST: 10 %
FEV1-%PRED-POST: 90 %
FEV1-%Pred-Pre: 82 %
FEV1-Post: 1.99 L
FEV1-Pre: 1.8 L
FEV1FVC-%CHANGE-POST: 4 %
FEV1FVC-%PRED-PRE: 114 %
FEV6-%Change-Post: 5 %
FEV6-%PRED-POST: 78 %
FEV6-%Pred-Pre: 74 %
FEV6-PRE: 2.05 L
FEV6-Post: 2.16 L
FEV6FVC-%PRED-PRE: 104 %
FEV6FVC-%Pred-Post: 104 %
FVC-%CHANGE-POST: 5 %
FVC-%Pred-Post: 75 %
FVC-%Pred-Pre: 71 %
FVC-POST: 2.16 L
FVC-PRE: 2.05 L
POST FEV1/FVC RATIO: 92 %
PRE FEV6/FVC RATIO: 100 %
Post FEV6/FVC ratio: 100 %
Pre FEV1/FVC ratio: 88 %

## 2018-03-20 NOTE — Progress Notes (Signed)
Pre and Post Arlyce Harman completed today.Blair Hailey

## 2018-03-23 ENCOUNTER — Ambulatory Visit (INDEPENDENT_AMBULATORY_CARE_PROVIDER_SITE_OTHER): Payer: Medicare Other | Admitting: Internal Medicine

## 2018-03-23 ENCOUNTER — Encounter: Payer: Self-pay | Admitting: Internal Medicine

## 2018-03-23 VITALS — BP 120/68 | HR 84 | Ht 62.0 in | Wt 160.4 lb

## 2018-03-23 DIAGNOSIS — J8489 Other specified interstitial pulmonary diseases: Secondary | ICD-10-CM | POA: Diagnosis not present

## 2018-03-23 DIAGNOSIS — K219 Gastro-esophageal reflux disease without esophagitis: Secondary | ICD-10-CM | POA: Diagnosis not present

## 2018-03-23 DIAGNOSIS — Z8739 Personal history of other diseases of the musculoskeletal system and connective tissue: Secondary | ICD-10-CM

## 2018-03-23 DIAGNOSIS — J849 Interstitial pulmonary disease, unspecified: Secondary | ICD-10-CM | POA: Diagnosis not present

## 2018-03-23 DIAGNOSIS — Z5181 Encounter for therapeutic drug level monitoring: Secondary | ICD-10-CM

## 2018-03-23 LAB — ACID FAST CULTURE WITH REFLEXED SENSITIVITIES (MYCOBACTERIA): Acid Fast Culture: NEGATIVE

## 2018-03-23 MED ORDER — PREDNISONE 10 MG PO TABS: ORAL_TABLET | ORAL | 1 refills | Status: DC

## 2018-03-23 NOTE — Progress Notes (Signed)
Subjective:     Patient ID: Sharon [name], female   DOB: [birth year], 67 y.o.   MRN: [id number]  HPI     OV 11/01/2017 - ILD clinic   - transfer of care and 2nd opinion  Chief Complaint  Patient presents with  . Advice Only    Referred by Dr. Melford [name] due to an abnormal CT.  Pt does have complaints of a dry cough and SOB with exertion or if has a coughing episode.    67 year old female referred by the primary care physician to the ILD clinic for evaluation of her pulmonary problems with symptoms of cough and shortness of breath.  According to the patient for a better part of 2 years she has had episodic cough particularly in the fall season.  She says she is active in the outdoors doing gardening and cutting and blowing and mowing and she usually wears a mask but nevertheless she will get a bronchitis episode that will require nebulizers and prednisone to resolve.  Symptoms will usually resolve over 2-3 weeks.  However this time around it is taken 8 weeks to resolve.  Last prednisone was over a week or 2 ago.  She still has some ongoing wheezing.  In the background of this episodic cough she says between episodes she does not have any cough but she does have some baseline shortness of breath when she climbs a flight of stairs this is been stable and is of insidious onset also present for 2 years.  She did have a CT scan of the chest approximately 9 months ago in May 2018 that shows in my personal opinion based upon my personal visualization bilateral subpleural reticulation particularly in the upper lobes and some subpleural reticulation in the left lower lobe.  There is no clear distinct craniocaudal gradient in my opinion this would be indeterminate for UIP without a clear alternate etiology.  Given the presence of ILD findings she has been sent to the ILD clinic.  Review of the chart also shows she is seen.my colleague Dr. Melvyn [name] for asthma symptoms and is been placed on Symbicort which she takes 1  puff twice daily.  The exam nitric oxide a week after prednisone and while on Symbicort is elevated to near abnormal levels at 48 ppb today  [employer] of chest physicians interstitial lung disease questionnaire -Past medical history: Positive for allergies.  Sharon [name] was elevated to 168 approximately a year or 2 ago on chart review.  In addition she gives a history of rheumatoid arthritis diagnosed many many years ago.  Seen by Dr. Keturah [name] at that time.  She is only followed up with nonsteroidal anti-inflammatory drugs.  She has not followed up with Dr. Keturah [name] in many years.  She feels she needs to go back.  However in May 2017 her autoimmune limited profile done here was negative.  -Personal exposure history: She has smoked marijuana in the past.  She smokes cigarettes from age 72 to age 36 a pack a day and quit.  -Family history of lung disease: Sister Sharon [name] who lives in [place] apparently has had a lung biopsy and has been diagnosed with sarcoidosis recently.  She is also a smoker.  Father died of congestive heart failure.  There is no diagnosis of pulmonary fibrosis or hypersensitivity pneumonitis  -Home exposure history: Has not lived in a house that is old in the last 10 years.  There is no humidifier or insomnia or hot tub or Jacuzzi or water damage or  mold.  She has 1 dog.  She does not have any birds.  She does not use any feathered pillows.  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 Range 02/03/2016 11:20  Anit Nuclear Antibody(ANA) Latest Ref  Range: NEGATIVE  NEG  Cyclic Citrullin Peptide Ab Latest Units: Units <16  RA Latex Turbid. Latest Ref Range: <=14 IU/mL <10    Walking desaturation test on 11/01/2017 185 feet x 3 laps on ROOM AIR:  did not desaturate. Rest pulse ox was 100%, final pulse ox was 97%. HR response 88/min at rest to 100/min at peak exertion. Patient Sharon [name]  Did not Desaturate < 88% . [name] yes did  Desaturated </= 3% points. [name] yes did get tachyardic   FeNO - 48ppb and almost abnormal   OV 12/13/2017  Chief Complaint  Patient presents with  . Follow-up    PFT done today. Pt states after last visit on 11/01/17, she developed bronchitis and had it x2 weeks. Pt states she still has a mild cough. Denies any SOB or CP.   Follow-up interstitial lung disease with asthma/allergy phenotype  She returns for follow-up after investigations.  [name] who is well-known to me through working to medical ICU where she is a Equities trader.  We did a history read taken patient tells me that she moved from [place] to [place] many years ago.  She does regular gardening and works in the yard [employer].  Many times the leads are damp and there is she suspects mold in it.  She also burns the lesion is exposed to the smoke.  Symbicort is helping but approximately 2 weeks ago had respiratory exacerbation that was not related to gardening [in fact she has not gotten this whole year] and ended up in the ER treated with antibiotics and prednisone.  Currently back to baseline.  She had pulmonary function test today that shows mixed obstruction and restriction associated with flow volume loop abnormalities.  She had a hypersensitivity pneumonitis panel documented below that is negative.  She had limited autoimmune panel [the CMA at last visit did not order the full panel] and this was normal.  She had repeat CT scan of the chest read by thoracic radiology I personally  visualized this and agree with the findings.  It is indeterminate for UIP and there is no specific alternate pattern.  No  air trapping is described.  The differential diagnosis appears to be broad.     Results for Sharon [name], Sharon [name] (MRN [id number]) as of 12/13/2017 09:57  Ref. Range 11/01/2017 16:22  Faenia retivirgula Latest Ref Range: NEGATIVE  NEGATIVE  S. VIRIDIS Latest Ref Range: NEGATIVE  NEGATIVE  T. CANDIDUS Latest Ref Range: NEGATIVE  NEGATIVE  T. VULGARIS Latest Ref Range: NEGATIVE  NEGATIVE    Results for Sharon [name], Sharon [name] (MRN [id number]) as of 12/13/2017 09:57  Ref. Range 11/01/2017 16:22  Anit Nuclear Antibody(ANA) Latest Ref Range: NEGATIVE  NEGATIVE  Angiotensin-Converting Enzyme Latest Ref Range: 9 - 67 U/L 25  Cyclic Citrullin Peptide Ab Latest Units: UNITS <16  ds DNA Ab Latest Units: IU/mL <1  RA Latex Turbid. Latest Ref Range: <14 IU/mL <14  Sharon [name] (Immunoglobulin E), Serum Latest Ref Range: <OR=114 kU/L 252 (H)    IMPRESSION: CT FEb 2019 1. Pulmonary parenchymal pattern of fibrotic interstitial lung disease is unchanged from 02/07/2017 and may be due to nonspecific interstitial pneumonitis or mild chronic hypersensitivity pneumonitis. Findings are not consistent with usual interstitial pneumonitis. 2. Aortic atherosclerosis (ICD10-170.0). Coronary artery calcification.   Electronically Signed   By: Lorin Picket M.D.   On: 11/14/2017 12:56   OV 02/23/2018  Chief Complaint  Patient presents with  . Follow-up    Pt had lung biopsy done and states she is doing good since then. States only time she has pain is when she sneezes. Denies any current complaints of cough, SOB, or CP.    Here to review SLB results -slides have been read by our resident pathologist with whom I discussed via email.  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  In addition he did see some eosinophilic infiltrates in the  interstitium.  This can explain the high nitric oxide that she has.  She is here with her husband.  Since surgery she is doing well.  She only has some pain in her chest when she sneezes at the postsurgical site.  She still has one suture hanging out which Dr. Roxan Hockey said was okay to remove and my LPN removed it.  But overall he she and her husband are here to discuss the pathology report.  They want to know prognosis and implications.  Her grandchild is now 18 days old and she wants to spend a lot of time taking care of the grandchild.  She is aware of prednisone side effects.  Her husband says that her mood might change because of prednisone but patient herself says she has tolerated prednisone just fine in the past.     OV 03/23/2018  Chief Complaint  Patient presents with  . Follow-up    PFT performed 6/24.    Pt states she has been doing well since last visit. States only complaint is fatigue in the afternoons.    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 Remot hx of RA Rx with NSAID but 2019 - autoimmune panel negative. Started prednisone 02/23/18   Sharon [name] is here to follow-up for her interstitial lung disease as above. She tells me that she is going to start 40 mg prednisone tomorrow. Meanwhile the prednisone has caused her to have 10 pound weight gain. Her clothes are barely fitting her. She is also having acidity and acid reflux for which she is not on treatment at this point. Otherwise overall she is tolerating things fine. There are no other new issues. She had pulmonary function test today and it shows 11% improvement in the postbronchodilator FVC. She feels her dyspnea has improved     Results for Sharon [name], Sharon [name] (MRN [id number]) as of 03/23/2018 16:31  Ref. Range 12/13/2017 09:03 03/20/2018 09:11  FVC-Post Latest Units: L 1.94 2.16  FVC-%Pred-Post Latest Units: % 68 75     Simple office walk 185 feet x  3 laps goal with forehead probe  02/23/2018   O2 used Room air  Number laps completed 3  Comments about pace slow  Resting Pulse Ox/HR 100% and 71/min  Final Pulse Ox/HR 99% and 89/min  Desaturated </= 88% no  Desaturated <= 3% points no  Got Tachycardic >/= 90/min no  Symptoms at end of test Mild dyspnea  Miscellaneous comments x      has a past medical history of Anemia, Chronic major depressive disorder, DDD (degenerative disc disease), cervical, Depression, Dyspnea, Hypertension, Interstitial lung disease ([place]), Mixed hyperlipidemia, Pain in joints of both feet, Pneumonia (2016), Polyarthropathy, Stroke ([place]), Vision abnormalities, and Vitamin D deficiency.   reports that she quit smoking about 39 years ago. Her smoking use included cigarettes. She has a 30.00 pack-year smoking history. She has never used smokeless tobacco.  Past Surgical History:  Procedure Laterality Date  . CARPAL TUNNEL RELEASE    . CESAREAN SECTION     x2  . COLONOSCOPY    . LUNG BIOPSY Left 02/08/2018   Procedure: LUNG BIOPSY;  Surgeon: Melrose Nakayama, MD;  Location: [facility];  Service: Thoracic;  Laterality: Left;  . right arm tendon surgery    . TAYLOR BUNIONECTOMY     2 on one foot and 1 on the other foot  . VIDEO ASSISTED THORACOSCOPY Left 02/08/2018   Procedure: VIDEO ASSISTED THORACOSCOPY;  Surgeon: Melrose Nakayama, MD;  Location: [facility];  Service: Thoracic;  Laterality: Left;  Marland Kitchen VIDEO BRONCHOSCOPY N/A 02/08/2018   Procedure: VIDEO BRONCHOSCOPY;  Surgeon: Melrose Nakayama, MD;  Location: [facility] OR;  Service: Thoracic;  Laterality: N/A;    Allergies  Allergen Reactions  . Codeine Nausea Only    GI upset     Immunization History  Administered Date(s) Administered  . Influenza, High Dose Seasonal PF 10/25/2017  . Influenza-Unspecified 09/27/2013  . Pneumococcal Conjugate-13 10/25/2017    Family History  Problem Relation Age of Onset  . Emphysema Father   . Asthma Father   . Congestive Heart Failure Father   .  Stroke Father   . Asthma Sister   . COPD Sister   . Stroke Mother      Current Outpatient Medications:  .  albuterol (PROVENTIL HFA;VENTOLIN HFA) 108 (90 BASE) MCG/ACT inhaler, Inhale 2 puffs into the lungs every 4 (four) hours as needed for wheezing or shortness of breath., Disp: , Rfl:  .  ALPRAZolam (XANAX) 0.5 MG tablet, Take 0.5 mg by mouth daily as needed for anxiety. , Disp: , Rfl:  .  aspirin EC 81 MG tablet, Take 81 mg by mouth daily., Disp: , Rfl:  .  atorvastatin (LIPITOR) 40 MG tablet, Take 1 tablet (40 mg total) by mouth daily., Disp: 90 tablet, Rfl: 1 .  budesonide-formoterol (SYMBICORT) 80-4.5 MCG/ACT inhaler, Take 2 puffs first thing in am and then another 2 puffs about 12 hours later., Disp: 1 Inhaler, Rfl: 12 .  calcium carbonate (TUMS EX) 750 MG chewable tablet, Chew 1 tablet by mouth daily as needed for heartburn., Disp: , Rfl:  .  clopidogrel (PLAVIX) 75 MG tablet, Take 1 tablet (75 mg total) by mouth daily., Disp: 21 tablet, Rfl: 0 .  dextromethorphan-guaiFENesin (MUCINEX DM) 30-600 MG per 12 hr tablet, Take 1 tablet by mouth 2 (two) times daily as needed (congestion). , Disp: , Rfl:  .  DULoxetine (CYMBALTA) 30 MG capsule, Take 60 mg by mouth once daily, Disp: , Rfl:  .  latanoprost (XALATAN) 0.005 % ophthalmic solution, Place 1 drop into both eyes at bedtime., Disp: , Rfl:  .  losartan (COZAAR) 50 MG tablet, Take 50 mg by mouth daily. , Disp: , Rfl:  .  predniSONE (DELTASONE) 10 MG tablet, 60mg x2weeks,50mg x2weeks,then continue on 40mg  daily,  Disp: 180 tablet, Rfl: 0 .  traMADol (ULTRAM) 50 MG tablet, Take by mouth., Disp: , Rfl:      Review of Systems     Objective:   Physical Exam Today's Vitals   03/23/18 1609  BP: 120/68  Pulse: 84  SpO2: 97%  Weight: 160 lb 6.4 oz (72.8 kg)  Height: 5\' 2"  (1.575 m)    Estimated body mass index is 29.34 kg/m as calculated from the following:   Height as of this encounter: 5\' 2"  (1.575 m).   Weight as of this  encounter: 160 lb 6.4 oz (72.8 kg).     Assessment:       ICD-10-CM   1. ILD (interstitial lung disease) ([place]) J84.9   2. 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 Personal history of rheumatoid arthritis Z87.39   4. Gastroesophageal reflux disease, esophagitis presence not specified K21.9   5. Encounter for therapeutic drug monitoring Z51.81        Plan:      ILD (interstitial lung disease) ([place]) 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[place])   - lung function 11-15% better and I think is because of prednisone responsive NSIP - currently at 75% capacity  - switch to prednisone 40mg  per day per schedule x 2 weeks, then 30mg  daily x 2 weeks, then 20mg  per days x 2 weeks, then 10mg  per day to continue  Personal history of rheumatoid arthritis - I am not sure you have RA anymore but the presence of RA can have impact on NSIP- ILD and how we treat it long term  - recommend re-establishing with Dr Bronson Curb to see if you indeed have seronegative RA  Gastroesophageal reflux disease, esophagitis presence not specified Encounter for therapeutic drug monitoring  - you are having prednisone side effect - start OTC prilosec 20mg  per day on empty stomatch  + antacid or ranitidine as needed  Followup 8-12 weeks (10 weeks)  - repeat spirometry  Alone . No DLCO. No post BD., No lung volume ILD clinic in 8-12 weeks but after spriometry    > 50% of this > 25 min visit spent in face to face counseling or coordination of care - by this undersigned MD - Dr Brand Males. This includes one or more of the following documented above: discussion of test results, diagnostic or treatment recommendations, prognosis, risks and benefits of management options, instructions, education, compliance or risk-factor reduction   Dr. Brand Males, M.D., [facility].C.P Pulmonary and Critical Care Medicine Staff Physician, [place] Director - Interstitial Lung Disease   Program  Pulmonary [place] at [place], [place], [address]  Pager: [phone], If no answer or between  15:00h - 7:00h: call [phone] Telephone: [phone]

## 2018-03-23 NOTE — Patient Instructions (Addendum)
ICD-10-CM   1. ILD (interstitial lung disease) ([place]) J84.9   2. 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 Personal history of rheumatoid arthritis Z87.39   4. Gastroesophageal reflux disease, esophagitis presence not specified K21.9   5. Encounter for therapeutic drug monitoring Z51.81    ILD (interstitial lung disease) ([place]) 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[place])   - lung function 11-15% better and I think is because of prednisone responsive NSIP - currently at 75% capacity  - switch to prednisone 40mg  per day per schedule x 2 weeks, then 30mg  daily x 2 weeks, then 20mg  per days x 2 weeks, then 10mg  per day to continue  Personal history of rheumatoid arthritis  - I am not sure you have RA anymore but the presence of RA can have impact on NSIP- ILD and how we treat it long term  - recommend re-establishing with Dr Bronson Curb to see if you have seronegative RA  Gastroesophageal reflux disease, esophagitis presence not specified Encounter for therapeutic drug monitoring  - you are having prednisone side effect - start OTC prilosec 20mg  per day on empty stomatch  + antacid or ranitidine as needed  Followup 8-12 weeks (10 weeks)  - repeat spirometry  Alone . No DLCO. No post BD., No lung volume ILD clinic in 8-12 weeks but after spriometry

## 2018-05-18 ENCOUNTER — Ambulatory Visit (INDEPENDENT_AMBULATORY_CARE_PROVIDER_SITE_OTHER): Payer: Medicare Other | Admitting: Internal Medicine

## 2018-05-18 ENCOUNTER — Encounter: Payer: Self-pay | Admitting: Internal Medicine

## 2018-05-18 VITALS — BP 158/84 | HR 71

## 2018-05-18 DIAGNOSIS — J8489 Other specified interstitial pulmonary diseases: Secondary | ICD-10-CM | POA: Diagnosis not present

## 2018-05-18 DIAGNOSIS — J849 Interstitial pulmonary disease, unspecified: Secondary | ICD-10-CM

## 2018-05-18 LAB — PULMONARY FUNCTION TEST
FEF 25-75 Pre: 2.85 L/sec
FEF2575-%PRED-PRE: 146 %
FEV1-%Pred-Pre: 83 %
FEV1-Pre: 1.83 L
FEV1FVC-%Pred-Pre: 115 %
FEV6-%PRED-PRE: 74 %
FEV6-Pre: 2.06 L
FEV6FVC-%PRED-PRE: 104 %
FVC-%PRED-PRE: 71 %
FVC-PRE: 2.06 L
PRE FEV1/FVC RATIO: 89 %
Pre FEV6/FVC Ratio: 100 %

## 2018-05-18 NOTE — Patient Instructions (Addendum)
  ILD (interstitial lung disease) ([facility]) 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[place])   - lung function 11-15% better May 2019 through June 2019 and same since June 2019 at 71% - continue prednisone at 10mg  per day - though there is opportunity to increase prednisone and see if lung function will improive I do not want to take the risk with prednisone higher dose - instead we will follow closely  - in 2 months repeat spirometry and reutrn to ILD clinic   Personal history of rheumatoid arthritis - did you end up seeing  Dr Estanislado Pandy for this as discussed last visti?   Followup 8  - repeat spirometry with DLCO; if you can do DLCO ILD clinic in 8 weeks but after spriometry

## 2018-05-18 NOTE — Progress Notes (Signed)
Pre Arlyce Harman only completed today.Blair Hailey

## 2018-05-18 NOTE — Progress Notes (Signed)
Subjective:     Patient ID: Sharon [name], female   DOB: [birth year], 67 y.o.   MRN: [id number]  HPI    OV 11/01/2017 - ILD clinic   - transfer of care and 2nd opinion  Chief Complaint  Patient presents with  . Advice Only    Referred by Dr. Melford Aase due to an abnormal CT.  Pt does have complaints of a dry cough and SOB with exertion or if has a coughing episode.    67 year old female referred by the primary care physician to the ILD clinic for evaluation of her pulmonary problems with symptoms of cough and shortness of breath.  According to the patient for a better part of 2 years she has had episodic cough particularly in the fall season.  She says she is active in the outdoors doing gardening and cutting and blowing and mowing and she usually wears a mask but nevertheless she will get a bronchitis episode that will require nebulizers and prednisone to resolve.  Symptoms will usually resolve over 2-3 weeks.  However this time around it is taken 8 weeks to resolve.  Last prednisone was over a week or 2 ago.  She still has some ongoing wheezing.  In the background of this episodic cough she says between episodes she does not have any cough but she does have some baseline shortness of breath when she climbs a flight of stairs this is been stable and is of insidious onset also present for 2 years.  She did have a CT scan of the chest approximately 9 months ago in May 2018 that shows in my personal opinion based upon my personal visualization bilateral subpleural reticulation particularly in the upper lobes and some subpleural reticulation in the left lower lobe.  There is no clear distinct craniocaudal gradient in my opinion this would be indeterminate for UIP without a clear alternate etiology.  Given the presence of ILD findings she has been sent to the ILD clinic.  Review of the chart also shows she is seen.my colleague Dr. Melvyn Novas for asthma symptoms and is been placed on Symbicort which she takes 1 puff  twice daily.  The exam nitric oxide a week after prednisone and while on Symbicort is elevated to near abnormal levels at 48 ppb today  [employer] of chest physicians interstitial lung disease questionnaire -Past medical history: Positive for allergies.  IgE was elevated to 168 approximately a year or 2 ago on chart review.  In addition she gives a history of rheumatoid arthritis diagnosed many many years ago.  Seen by Dr. Keturah Barre at that time.  She is only followed up with nonsteroidal anti-inflammatory drugs.  She has not followed up with Dr. Keturah Barre in many years.  She feels she needs to go back.  However in May 2017 her autoimmune limited profile done here was negative.  -Personal exposure history: She has smoked marijuana in the past.  She smokes cigarettes from age 40 to age 66 a pack a day and quit.  -Family history of lung disease: Sister [name] who lives in [place] apparently has had a lung biopsy and has been diagnosed with sarcoidosis recently.  She is also a smoker.  Father died of congestive heart failure.  There is no diagnosis of pulmonary fibrosis or hypersensitivity pneumonitis  -Home exposure history: Has not lived in a house that is old in the last 10 years.  There is no humidifier or insomnia or hot tub or Jacuzzi or water damage or mold.  She has 1 dog.  She does not have any birds.  She does not use any feathered pillows.  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 Range 02/03/2016 11:20  Anit Nuclear Antibody(ANA) Latest Ref Range:  NEGATIVE  NEG  Cyclic Citrullin Peptide Ab Latest Units: Units <16  RA Latex Turbid. Latest Ref Range: <=14 IU/mL <10    Walking desaturation test on 11/01/2017 185 feet x 3 laps on ROOM AIR:  did not desaturate. Rest pulse ox was 100%, final pulse ox was 97%. HR response 88/min at rest to 100/min at peak exertion. Patient Sharon [name]  Did not Desaturate < 88% . Hipolito Bayley Benthall yes did  Desaturated </= 3% points. Hipolito Bayley Goertzen yes did get tachyardic   FeNO - 48ppb and almost abnormal   OV 12/13/2017  Chief Complaint  Patient presents with  . Follow-up    PFT done today. Pt states after last visit on 11/01/17, she developed bronchitis and had it x2 weeks. Pt states she still has a mild cough. Denies any SOB or CP.   Follow-up interstitial lung disease with asthma/allergy phenotype  She returns for follow-up after investigations.  [name] who is well-known to me through working to medical ICU where she is a Equities trader.  We did a history read taken patient tells me that she moved from [place] to [place] many years ago.  She does regular gardening and works in the yard [employer].  Many times the leads are damp and there is she suspects mold in it.  She also burns the lesion is exposed to the smoke.  Symbicort is helping but approximately 2 weeks ago had respiratory exacerbation that was not related to gardening [in fact she has not gotten this whole year] and ended up in the ER treated with antibiotics and prednisone.  Currently back to baseline.  She had pulmonary function test today that shows mixed obstruction and restriction associated with flow volume loop abnormalities.  She had a hypersensitivity pneumonitis panel documented below that is negative.  She had limited autoimmune panel [the CMA at last visit did not order the full panel] and this was normal.  She had repeat CT scan of the chest read by thoracic radiology I personally  visualized this and agree with the findings.  It is indeterminate for UIP and there is no specific alternate pattern.  No  air trapping is described.  The differential diagnosis appears to be broad.     Results for HALINA, ASANO (MRN [id number]) as of 12/13/2017 09:57  Ref. Range 11/01/2017 16:22  Faenia retivirgula Latest Ref Range: NEGATIVE  NEGATIVE  S. VIRIDIS Latest Ref Range: NEGATIVE  NEGATIVE  T. CANDIDUS Latest Ref Range: NEGATIVE  NEGATIVE  T. VULGARIS Latest Ref Range: NEGATIVE  NEGATIVE    Results for ARTESIA, BERKEY (MRN [id number]) as of 12/13/2017 09:57  Ref. Range 11/01/2017 16:22  Anit Nuclear Antibody(ANA) Latest Ref Range: NEGATIVE  NEGATIVE  Angiotensin-Converting Enzyme Latest Ref Range: 9 - 67 U/L 25  Cyclic Citrullin  Peptide Ab Latest Units: UNITS <16  ds DNA Ab Latest Units: IU/mL <1  RA Latex Turbid. Latest Ref Range: <14 IU/mL <14  IgE (Immunoglobulin E), Serum Latest Ref Range: <OR=114 kU/L 252 (H)    IMPRESSION: CT FEb 2019 1. Pulmonary parenchymal pattern of fibrotic interstitial lung disease is unchanged from 02/07/2017 and may be due to nonspecific interstitial pneumonitis or mild chronic hypersensitivity pneumonitis. Findings are not consistent with usual interstitial pneumonitis. 2. Aortic atherosclerosis (ICD10-170.0). Coronary artery calcification.   Electronically Signed   By: Lorin Picket M.D.   On: 11/14/2017 12:56   OV 02/23/2018  Chief Complaint  Patient presents with  . Follow-up    Pt had lung biopsy done and states she is doing good since then. States only time she has pain is when she sneezes. Denies any current complaints of cough, SOB, or CP.    Here to review SLB results -slides have been read by our resident pathologist with whom I discussed via email.  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  In addition he did see some eosinophilic infiltrates in the  interstitium.  This can explain the high nitric oxide that she has.  She is here with her husband.  Since surgery she is doing well.  She only has some pain in her chest when she sneezes at the postsurgical site.  She still has one suture hanging out which Dr. Roxan Hockey said was okay to remove and my LPN removed it.  But overall he she and her husband are here to discuss the pathology report.  They want to know prognosis and implications.  Her grandchild is now 78 days old and she wants to spend a lot of time taking care of the grandchild.  She is aware of prednisone side effects.  Her husband says that her mood might change because of prednisone but patient herself says she has tolerated prednisone just fine in the past.     OV 03/23/2018  Chief Complaint  Patient presents with  . Follow-up    PFT performed 6/24.    Pt states she has been doing well since last visit. States only complaint is fatigue in the afternoons.    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 Remot hx of RA Rx with NSAID but 2019 - autoimmune panel negative. Started prednisone 02/23/18   Ms. Stofer is here to follow-up for her interstitial lung disease as above. She tells me that she is going to start 40 mg prednisone tomorrow. Meanwhile the prednisone has caused her to have 10 pound weight gain. Her clothes are barely fitting her. She is also having acidity and acid reflux for which she is not on treatment at this point. Otherwise overall she is tolerating things fine. There are no other new issues. She had pulmonary function test today and it shows 11% improvement in the postbronchodilator FVC. She feels her dyspnea has improved   OV 05/18/2018  Chief Complaint  Patient presents with  . Follow-up    Pt has been having problems with the heat and affecting her breathing.     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 Remot hx of RA Rx with NSAID but 2019 -  autoimmune panel negative. Started prednisone 02/23/18  She has not tapered herself to 10 mg prednisone daily. This happened one week ago. Overall she feels stable. She has no complaints walking. She has gained weight but with a prednisone taper  that started last visit there has been no further weight gain.She does admit to dietary indiscretion particularly desserts. Her lung function test has now stabilized at 71% as documented below. Her pulse ox on walking is stable compared to earlier. There has been no further improvement in her lung function. There are no interim problems   Results for ELLA, GOLOMB (MRN [id number]) as of 05/18/2018 11:55  Ref. Range 12/13/2017 09:03 03/20/2018 09:11 05/18/2018 10:59  FVC-Pre Latest Units: L 1.65 2.05 2.06  FVC-%Pred-Pre Latest Units: % 58 71 71       Simple office walk 185 feet x  3 laps goal with forehead probe 02/23/2018  05/18/2018   O2 used Room air Room air  Number laps completed 3 3  Comments about pace slow Normal pace  Resting Pulse Ox/HR 100% and 71/min 100% and 71/min  Final Pulse Ox/HR 99% and 89/min 99% and 94/min  Desaturated </= 88% no no  Desaturated <= 3% points no no  Got Tachycardic >/= 90/min no yes  Symptoms at end of test Mild dyspnea none  Miscellaneous comments x x     has a past medical history of Anemia, Chronic major depressive disorder, DDD (degenerative disc disease), cervical, Depression, Dyspnea, Hypertension, Interstitial lung disease ([place]), Mixed hyperlipidemia, Pain in joints of both feet, Pneumonia (2016), Polyarthropathy, Stroke ([place]), Vision abnormalities, and Vitamin D deficiency.   reports that she quit smoking about 39 years ago. Her smoking use included cigarettes. She has a 30.00 pack-year smoking history. She has never used smokeless tobacco.  Past Surgical History:  Procedure Laterality Date  . CARPAL TUNNEL RELEASE    . CESAREAN SECTION     x2  . COLONOSCOPY    . LUNG BIOPSY Left 02/08/2018    Procedure: LUNG BIOPSY;  Surgeon: Melrose Nakayama, MD;  Location: [facility];  Service: Thoracic;  Laterality: Left;  . right arm tendon surgery    . TAYLOR BUNIONECTOMY     2 on one foot and 1 on the other foot  . VIDEO ASSISTED THORACOSCOPY Left 02/08/2018   Procedure: VIDEO ASSISTED THORACOSCOPY;  Surgeon: Melrose Nakayama, MD;  Location: [facility];  Service: Thoracic;  Laterality: Left;  Marland Kitchen VIDEO BRONCHOSCOPY N/A 02/08/2018   Procedure: VIDEO BRONCHOSCOPY;  Surgeon: Melrose Nakayama, MD;  Location: [facility];  Service: Thoracic;  Laterality: N/A;    Allergies  Allergen Reactions  . Codeine Nausea Only    GI upset     Immunization History  Administered Date(s) Administered  . Influenza, High Dose Seasonal PF 10/25/2017  . Influenza-Unspecified 09/27/2013  . Pneumococcal Conjugate-13 10/25/2017  . Pneumococcal-Unspecified 08/07/2008  . Zoster 12/06/2013    Family History  Problem Relation Age of Onset  . Emphysema Father   . Asthma Father   . Congestive Heart Failure Father   . Stroke Father   . Asthma Sister   . COPD Sister   . Stroke Mother      Current Outpatient Medications:  .  albuterol (PROVENTIL HFA;VENTOLIN HFA) 108 (90 BASE) MCG/ACT inhaler, Inhale 2 puffs into the lungs every 4 (four) hours as needed for wheezing or shortness of breath., Disp: , Rfl:  .  ALPRAZolam (XANAX) 0.5 MG tablet, Take 0.5 mg by mouth daily as needed for anxiety. , Disp: , Rfl:  .  aspirin EC 81 MG tablet, Take 81 mg by mouth daily., Disp: , Rfl:  .  atorvastatin (LIPITOR) 40 MG tablet, Take 1 tablet (40 mg total) by mouth daily.,  Disp: 90 tablet, Rfl: 1 .  budesonide-formoterol (SYMBICORT) 80-4.5 MCG/ACT inhaler, Take 2 puffs first thing in am and then another 2 puffs about 12 hours later., Disp: 1 Inhaler, Rfl: 12 .  calcium carbonate (TUMS EX) 750 MG chewable tablet, Chew 1 tablet by mouth daily as needed for heartburn., Disp: , Rfl:  .  clopidogrel (PLAVIX) 75 MG tablet, Take 1  tablet (75 mg total) by mouth daily., Disp: 21 tablet, Rfl: 0 .  dextromethorphan-guaiFENesin (MUCINEX DM) 30-600 MG per 12 hr tablet, Take 1 tablet by mouth 2 (two) times daily as needed (congestion). , Disp: , Rfl:  .  DULoxetine (CYMBALTA) 30 MG capsule, Take 60 mg by mouth once daily, Disp: , Rfl:  .  latanoprost (XALATAN) 0.005 % ophthalmic solution, Place 1 drop into both eyes at bedtime., Disp: , Rfl:  .  losartan (COZAAR) 50 MG tablet, Take 50 mg by mouth daily. , Disp: , Rfl:  .  predniSONE (DELTASONE) 10 MG tablet, Take 40mg x2weeks, 30mg x2weeks, 20mg x2weeks, then continue on 10mg  daily, Disp: 180 tablet, Rfl: 1 .  traMADol (ULTRAM) 50 MG tablet, Take by mouth., Disp: , Rfl:    Review of Systems     Objective:   Physical Exam  Constitutional: She is oriented to person, place, and time. She appears well-developed and well-nourished. No distress.  HENT:  Head: Normocephalic and atraumatic.  Right Ear: External ear normal.  Left Ear: External ear normal.  Mouth/Throat: Oropharynx is clear and moist. No oropharyngeal exudate.  Eyes: Pupils are equal, round, and reactive to light. Conjunctivae and EOM are normal. Right eye exhibits no discharge. Left eye exhibits no discharge. No scleral icterus.  Neck: Normal range of motion. Neck supple. No JVD present. No tracheal deviation present. No thyromegaly present.  Cardiovascular: Normal rate, regular rhythm, normal heart sounds and intact distal pulses. Exam reveals no gallop and no friction rub.  No murmur heard. Pulmonary/Chest: Effort normal and breath sounds normal. No respiratory distress. She has no wheezes. She has no rales. She exhibits no tenderness.  occ scattered crackles  Abdominal: Soft. Bowel sounds are normal. She exhibits no distension and no mass. There is no tenderness. There is no rebound and no guarding.  Musculoskeletal: Normal range of motion. She exhibits no edema or tenderness.  Lymphadenopathy:    She has no  cervical adenopathy.  Neurological: She is alert and oriented to person, place, and time. She has normal reflexes. No cranial nerve deficit. She exhibits normal muscle tone. Coordination normal.  Skin: Skin is warm and dry. No rash noted. She is not diaphoretic. No erythema. No pallor.  Psychiatric: She has a normal mood and affect. Her behavior is normal. Judgment and thought content normal.  Vitals reviewed.   Vitals:   05/18/18 1129  BP: (!) 158/84  Pulse: 71  SpO2: 100%    Estimated body mass index is 29.34 kg/m as calculated from the following:   Height as of 03/23/18: 5\' 2"  (1.575 m).   Weight as of 03/23/18: 160 lb 6.4 oz (72.8 kg).       Assessment:       ICD-10-CM   1. ILD (interstitial lung disease) ([place]) J84.9   2. 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        Plan:     ILD (interstitial lung disease) ([place]) 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[place])   - lung function 11-15% better May 2019 through June 2019 and same since June 2019 at 71% - continue prednisone at  10mg  per day - though there is opportunity to increase prednisone and see if lung function will improive I do not want to take the risk with prednisone higher dose - instead we will follow closely  - in 2 months repeat spirometry and reutrn to ILD clinic  - if spiro declining or at some point 9-12 months HRCT without much imprivement can cinsider increase in prednisone - will discuss Sept 2019 MDD    Personal history of rheumatoid arthritis - did you end up seeing  Dr Estanislado Pandy for this as discussed last visti?   Followup 8  - repeat spirometry with DLCO; if you can do DLCO ILD clinic in 8 weeks but after spriometry   Dr. Brand Males, M.D., [facility].C.P Pulmonary and Critical Care Medicine Staff Physician, [place] Director - Interstitial Lung Disease  Program  Pulmonary [place] at [place], [place],  [address]  Pager: [phone], If no answer or between  15:00h - 7:00h: call [phone] Telephone: [phone]

## 2018-07-13 ENCOUNTER — Ambulatory Visit: Payer: Medicare Other | Admitting: Internal Medicine

## 2018-10-05 IMAGING — CT CT CHEST HIGH RESOLUTION W/O CM
2 of 7 series · 15 of 36 positions shown, 18 images · non-contrast
Comparison: 02/07/2017.

CLINICAL DATA: Chronic cough with worsening since [REDACTED].
Evaluate for interstitial lung disease.

EXAM:
CT CHEST WITHOUT CONTRAST
TECHNIQUE: Multidetector CT imaging of the chest was performed following the
standard protocol without intravenous contrast. High resolution
imaging of the lungs, as well as inspiratory and expiratory imaging,
was performed.

[Series 2: high resolution · axial · 0.64mm/px · z∈[+845,+1081]mm · 12 of 134 slices shown, 15 images]
[im 8/134  mediastinal]
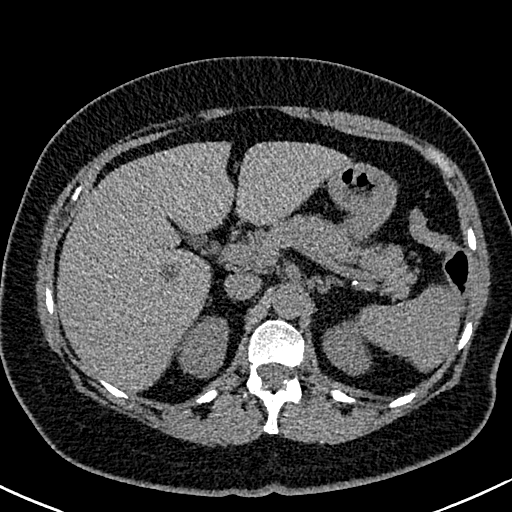
[im 8/134  lung]
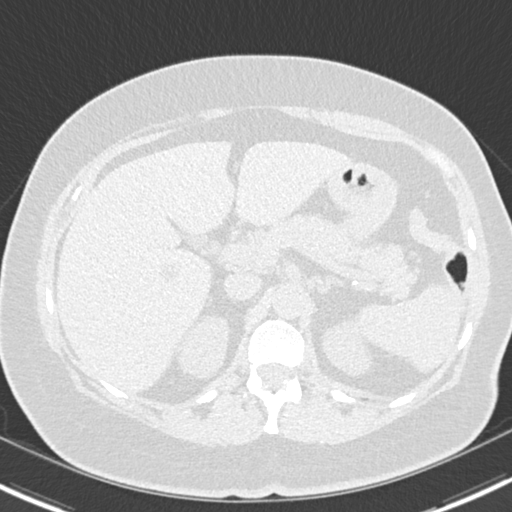
[im 24/134  lung]
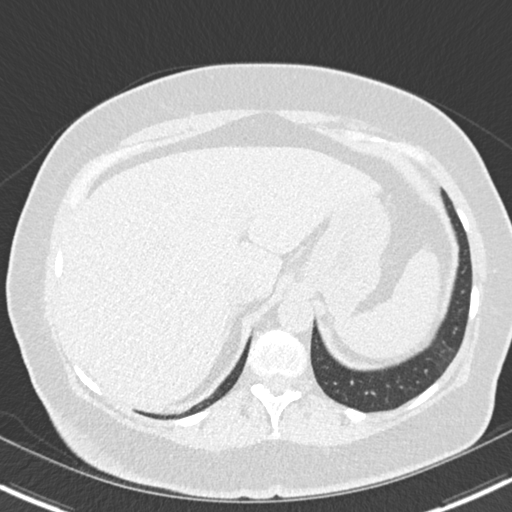
[im 32/134  lung]
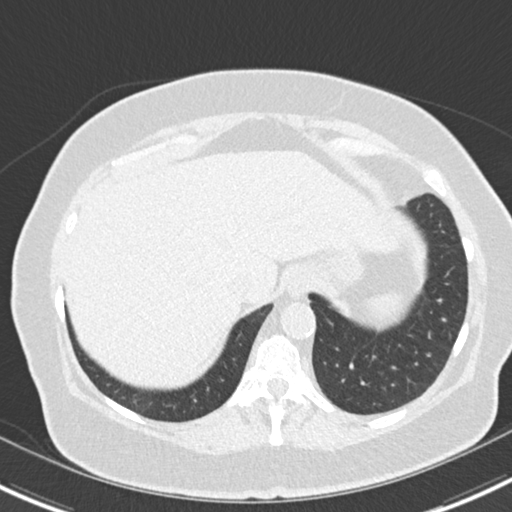
[im 40/134  lung]
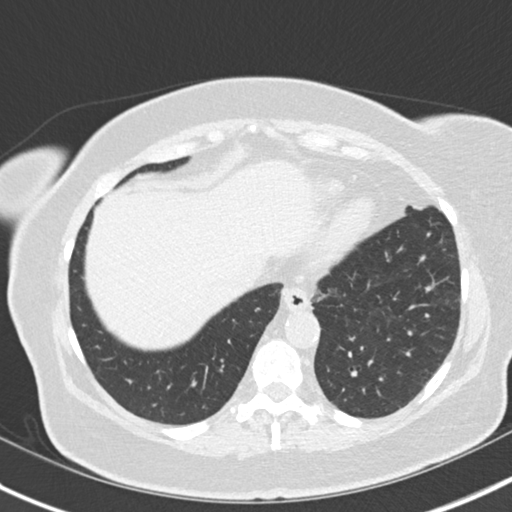
[im 55/134  mediastinal]
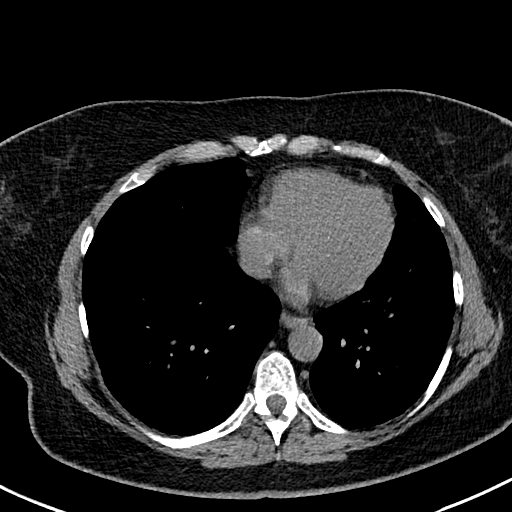
[im 55/134  lung]
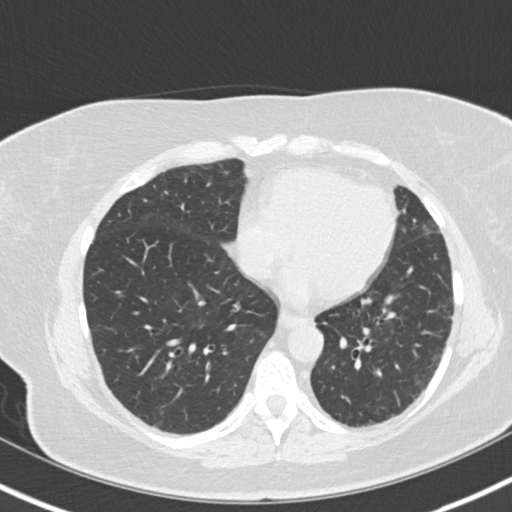
[im 63/134  lung]
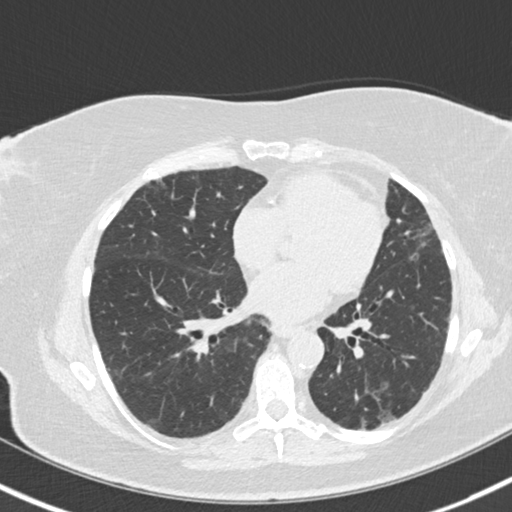
[im 71/134  lung]
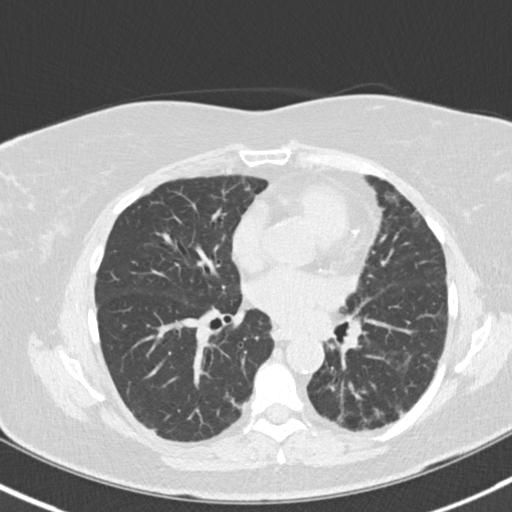
[im 87/134  lung]
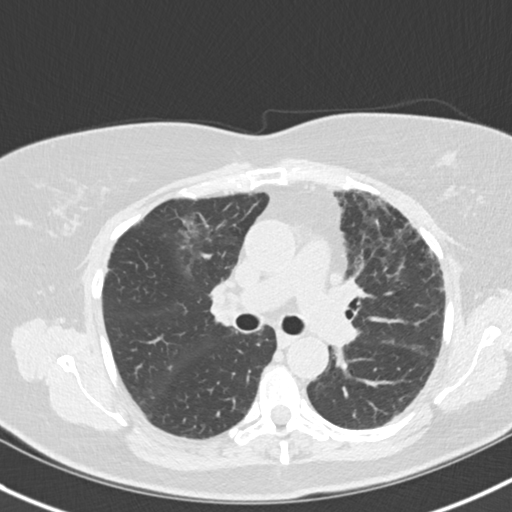
[im 94/134  mediastinal]
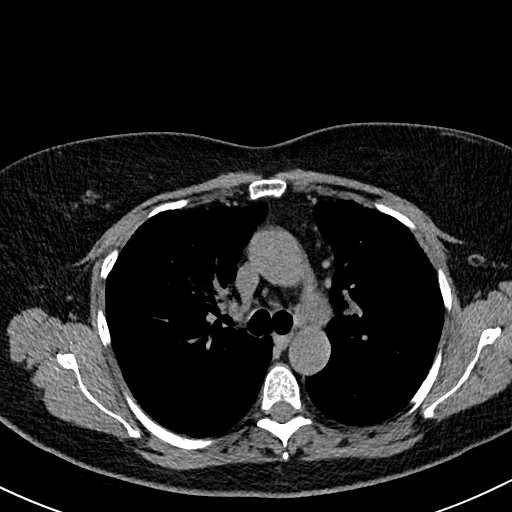
[im 94/134  lung]
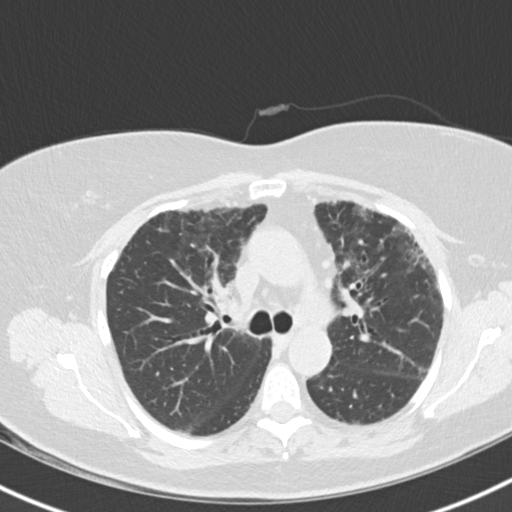
[im 102/134  lung]
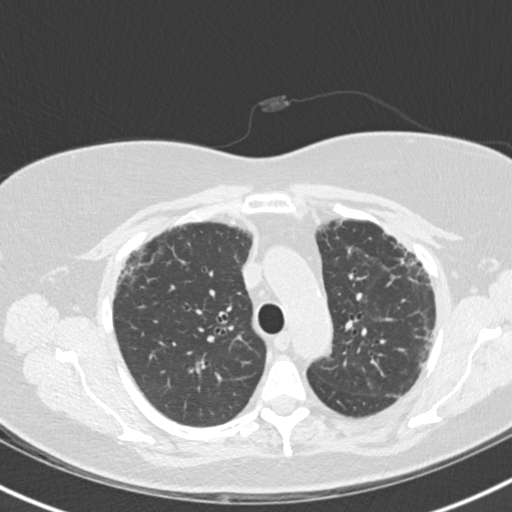
[im 118/134  lung]
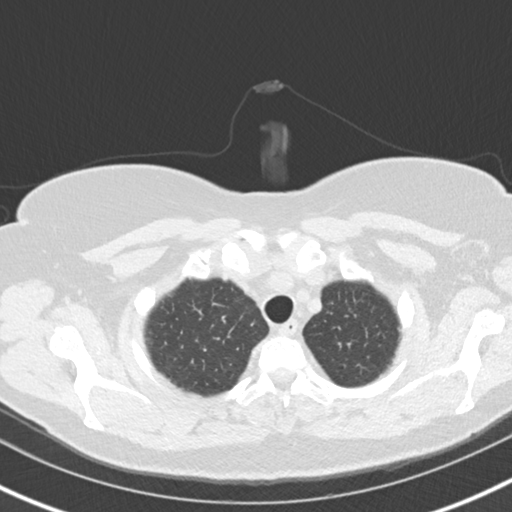
[im 126/134  lung]
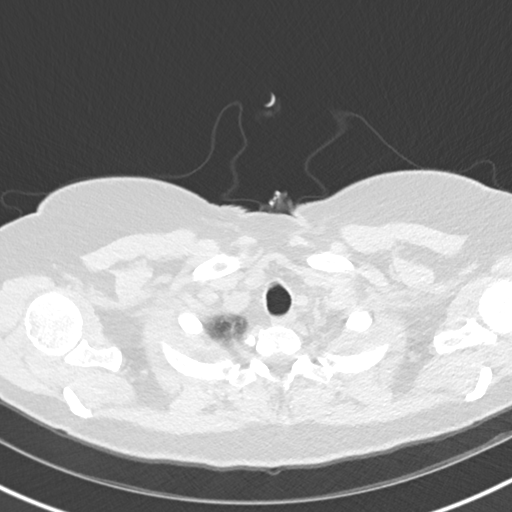

[Series 10: coronal · coronal · 0.57mm/px · 3 of 119 slices shown]
[im 24/119  lung]
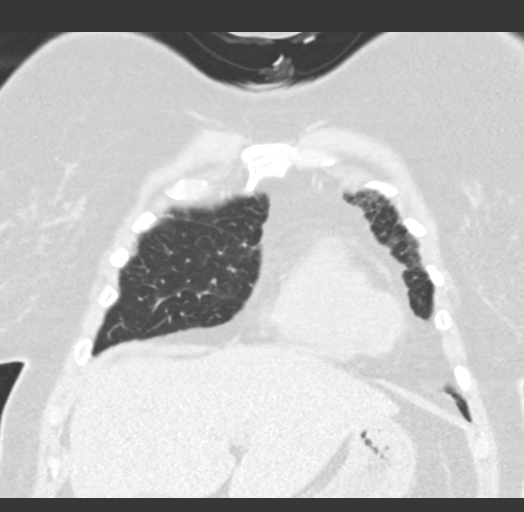
[im 48/119  lung]
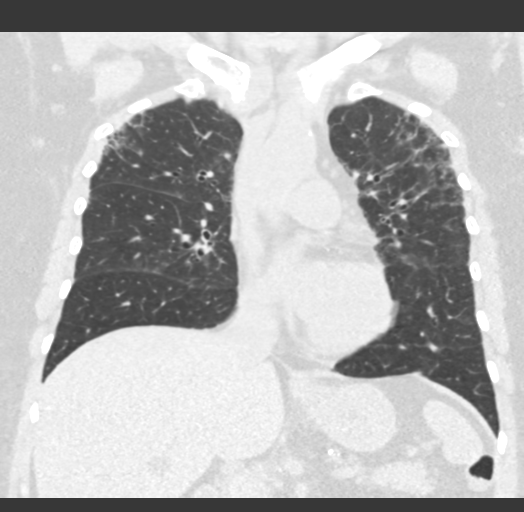
[im 71/119  lung]
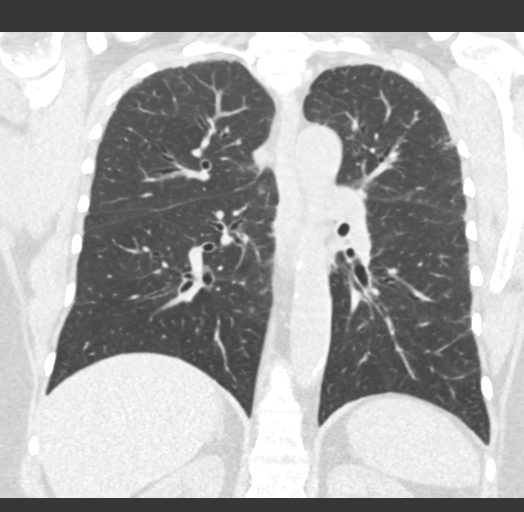

[15 of 36 positions shown; findings below may reference images not displayed]

FINDINGS: Cardiovascular: Atherosclerotic calcification of the arterial
vasculature, including coronary arteries. Heart size normal. No
pericardial effusion.

Mediastinum/Nodes: Mediastinal lymph nodes are not enlarged by CT
size criteria. Hilar regions are difficult to definitively evaluate
without IV contrast but appear grossly unremarkable. No axillary
adenopathy. Esophagus is grossly unremarkable.

Lungs/Pleura: Upper and midlung zone predominant interstitial
coarsening, ground-glass, subpleural reticulation and traction
bronchiectasis/bronchiolectasis. Findings appears stable from
02/07/2017. 4 mm posterior segment right upper lobe nodule (image
40), unchanged. No pleural fluid. Airway is otherwise unremarkable.
No air trapping.

Upper Abdomen: 18 mm low-attenuation lesion in the right hepatic
lobe is unchanged and likely a cyst. Definitive characterization is
limited without post-contrast imaging. Visualized portions of the
liver, gallbladder, adrenal glands, kidneys, spleen pancreas,
stomach and bowel are otherwise grossly unremarkable. No upper
abdominal adenopathy.

Musculoskeletal: Degenerative changes in the spine. No worrisome
lytic or sclerotic lesions.
IMPRESSION: 1. Pulmonary parenchymal pattern of fibrotic interstitial lung
disease is unchanged from 02/07/2017 and may be due to nonspecific
interstitial pneumonitis or mild chronic hypersensitivity
pneumonitis. Findings are not consistent with usual interstitial
pneumonitis.
2. Aortic atherosclerosis (BHEQ7-170.0). Coronary artery
calcification.

## 2019-02-12 ENCOUNTER — Other Ambulatory Visit: Payer: Self-pay | Admitting: Nurse Practitioner

## 2019-02-12 DIAGNOSIS — Z7952 Long term (current) use of systemic steroids: Secondary | ICD-10-CM

## 2019-02-12 DIAGNOSIS — Z1231 Encounter for screening mammogram for malignant neoplasm of breast: Secondary | ICD-10-CM

## 2019-03-26 NOTE — Progress Notes (Signed)
@Patient  ID: Sharon [name], female    DOB: [birth year], 68 y.o.   MRN: [id number]  Chief Complaint  Patient presents with  . 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  Quit 1980.  30-pack-year smoking history. Maintenance: Prednisone 10 mg daily Pt of: Dr. Chase Caller  03/27/2019  - Visit   68 year old female former smoker followed in our office for ILD presenting to our office today for a follow-up visit.  Patient with 2019 high-resolution CT chest showing NSIP versus chronic HP.  Patient in May/2019 had a lung biopsy that favors NSIP.  Patient has been on extended prednisone taper since then.  Patient finished prednisone about 2 weeks ago.  There was some confusion and discussion regarding referring her back to rheumatology in 2019 after high-resolution CT chest but patient has not completed this follow-up.  Patient reporting to me today that she saw Dr. Estanislado Pandy about 10 years ago for evaluation of rheumatoid arthritis and was found to be negative and told not to follow-up.  Patient had 2019 connective tissue work-up that was relatively negative with the exception of elevated MPO antibodies which Dr. Chase Caller found to be clinically insignificant at this time.  Prior to office visit today patient tolerated walk in office on room air.   Tests:   02/08/2018- lung wedge biopsy-surgical pathology- nonspecific interstitial pneumonia, mixed cellular patterns  12/13/2017- RNP antibodies-0.8 Sjogren's syndrome antibodies-negative  MPO/PR 3 ANCA antibodies-1.2 (antibody detected and elevated)  11/01/2017-sed rate-30 11/01/2017- ANA- negative  11/01/2017- anti-DNA antibody double-stranded-negative 11/01/2017- rheumatoid factor- less than 14 11/01/2017-CCP- negative  11/01/2017- ANCA-negative  11/01/2017- hypersensitivity pneumonitis panel-negative  11/14/2017-CT chest high-res- pulmonary parenchymal pattern of fibrotic interstitial lung disease is unchanged from 02/07/2017 and may be due to NSIP or mild chronic HP, findings are not consistent with usual interstitial pneumonitis  05/18/2018-spirometry- FVC 2.06 (71% predicted), ratio 89, FEV1 1.83 (83% predicted) Normal spirometry, restriction is possible  12/12/2017--echocardiogram-LV ejection fraction 60 to 65%, mild LVH, grade 1 diastolic dysfunction  SIX MIN WALK 03/27/2019 05/18/2018 02/23/2018 11/01/2017  Supplimental Oxygen during Test? (L/min) No No No No  Tech Comments: patient started walk with an O2 sat of 99% and HR of 89.  By the end of 3 laps patient had an O2 sat of 97% and HR 102. she tolerated well and was able to keep conversation the whole walk. no complaints. Completed 3 laps at a normal pace. No complaints. Pt walked at a slow pace completing all required laps. Only complaint was mild SOB. Pt walked at a normal pace completing all required laps.  Pt did not state any real complaints during walk.     FENO:  Lab Results  Component Value Date   NITRICOXIDE 48 11/01/2017    PFT: PFT Results Latest Ref Rng & Units 05/18/2018 03/20/2018 12/13/2017  FVC-Pre L 2.06 2.05 1.65  FVC-Predicted Pre % 71 71 58  FVC-Post L - 2.16 1.94  FVC-Predicted Post % - 75 68  Pre FEV1/FVC % % 89 88 85  Post FEV1/FCV % % - 92 91  FEV1-Pre L 1.83 1.80 1.39  FEV1-Predicted Pre % 83 82 64  FEV1-Post L - 1.99 1.76  DLCO UNC% % - - 66  DLCO COR %Predicted % - - 106    Imaging: No results found.  Specialty Problems      Pulmonary Problems   Cough variant asthma vs UACS     02/03/2016  > try symbicort 80 2bid  - Allergy profile No visit date found. >  Eos 0.2 /  IgE  168 Pos RAST  Dust, trees, grass  - 03/16/2016  After extensive coaching HFA effectiveness =    75%       Pulmonary nodules    CT chest 01/13/16  multiple nodules on  the right measuring up to 5 mm  - collagen vasc/hsp profile 02/03/2016  > neg  - CT chest 02/07/2017  Spectrum of findings are compatible with a chronic interstitial lung disease. Findings are favored to represent subacute on chronic hypersensitivity pneumonitis       ILD (interstitial lung disease) ([place])    02/08/2018- lung wedge biopsy-surgical pathology- nonspecific interstitial pneumonia, mixed cellular patterns  12/13/2017- RNP antibodies-0.8 Sjogren's syndrome antibodies-negative  MPO/PR 3 ANCA antibodies-1.2 (antibody detected and elevated)  11/01/2017-sed rate-30 11/01/2017- ANA- negative  11/01/2017- anti-DNA antibody double-stranded-negative 11/01/2017- rheumatoid factor- less than 14 11/01/2017-CCP- negative  11/01/2017- ANCA-negative 11/01/2017- hypersensitivity pneumonitis panel-negative  11/14/2017-CT chest high-res- pulmonary parenchymal pattern of fibrotic interstitial lung disease is unchanged from 02/07/2017 and may be due to NSIP or mild chronic HP, findings are not consistent with usual interstitial pneumonitis  05/18/2018-spirometry- FVC 2.06 (71% predicted), ratio 89, FEV1 1.83 (83% predicted) Normal spirometry, restriction is possible      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[place])    02/08/2018- lung wedge biopsy-surgical pathology- nonspecific interstitial pneumonia, mixed cellular patterns  11/14/2017-CT chest high-res- pulmonary parenchymal pattern of fibrotic interstitial lung disease is unchanged from 02/07/2017 and may be due to NSIP or mild chronic HP, findings are not consistent with usual interstitial pneumonitis  05/18/2018-spirometry- FVC 2.06 (71% predicted), ratio 89, FEV1 1.83 (83% predicted) Normal spirometry, restriction is possible         Allergies  Allergen Reactions  . Codeine Nausea Only    GI upset     Immunization History  Administered Date(s) Administered  . Influenza, High Dose Seasonal PF 10/25/2017  . Influenza-Unspecified 09/27/2013   . Pneumococcal Conjugate-13 10/25/2017  . Pneumococcal-Unspecified 08/07/2008  . Zoster 12/06/2013    Past Medical History:  Diagnosis Date  . Anemia    'years ago"  . Chronic major depressive disorder   . DDD (degenerative disc disease), cervical   . Depression   . Dyspnea   . Hypertension   . Interstitial lung disease ([place])   . Mixed hyperlipidemia   . Pain in joints of both feet   . Pneumonia 2016  . Polyarthropathy   . Stroke [facility])    TIA 11/11/17  . Vision abnormalities    catracts left eye  . Vitamin D deficiency     Tobacco History: Social History   Tobacco Use  Smoking Status Former Smoker  . Packs/day: 1.75  . Years: 20.00  . Pack years: 35.00  . Types: Cigarettes  . Start date: 48  . Quit date: 13  . Years since quitting: 29.5  Smokeless Tobacco Never Used   Counseling given: Yes   Continue to not smoke  Outpatient Encounter Medications as of 03/27/2019  Medication Sig  . albuterol (PROVENTIL HFA;VENTOLIN HFA) 108 (90 BASE) MCG/ACT inhaler Inhale 2 puffs into the lungs every 4 (four) hours as needed for wheezing or shortness of breath.  . ALPRAZolam (XANAX) 0.5 MG tablet Take 0.5 mg by mouth daily as needed for anxiety.   Marland Kitchen aspirin  EC 81 MG tablet Take 81 mg by mouth daily.  Marland Kitchen atorvastatin (LIPITOR) 40 MG tablet Take 1 tablet (40 mg total) by mouth daily.  . budesonide-formoterol (SYMBICORT) 80-4.5 MCG/ACT inhaler Take 2 puffs first thing in am and then another 2 puffs about 12 hours later.  . calcium carbonate (TUMS EX) 750 MG chewable tablet Chew 1 tablet by mouth daily as needed for heartburn.  . clopidogrel (PLAVIX) 75 MG tablet Take 1 tablet (75 mg total) by mouth daily.  Marland Kitchen dextromethorphan-guaiFENesin (MUCINEX DM) 30-600 MG per 12 hr tablet Take 1 tablet by mouth 2 (two) times daily as needed (congestion).   . DULoxetine (CYMBALTA) 30 MG capsule Take 60 mg by mouth once daily  . latanoprost (XALATAN) 0.005 % ophthalmic solution Place 1 drop  into both eyes at bedtime.  Marland Kitchen losartan (COZAAR) 50 MG tablet Take 50 mg by mouth daily.   . traMADol (ULTRAM) 50 MG tablet Take by mouth.  . [DISCONTINUED] predniSONE (DELTASONE) 10 MG tablet Take 40mg x2weeks, 30mg x2weeks, 20mg x2weeks, then continue on 10mg  daily  . dorzolamide-timolol (COSOPT) 22.3-6.8 MG/ML ophthalmic solution    No facility-administered encounter medications on file as of 03/27/2019.      Review of Systems  Review of Systems  Constitutional: Positive for fatigue. Negative for chills, fever and unexpected weight change.  HENT: Negative for congestion, ear pain, postnasal drip, sinus pressure and sinus pain.   Respiratory: Positive for shortness of breath (When going upstairs, or when walking outside in heat and humidity). Negative for cough, chest tightness and wheezing.   Cardiovascular: Negative for chest pain and palpitations.  Gastrointestinal: Negative for blood in stool, diarrhea, nausea and vomiting.       Occasional symptoms of acid reflux  Genitourinary: Negative for dysuria, frequency and urgency.  Musculoskeletal: Negative for arthralgias.  Skin: Negative for color change.  Allergic/Immunologic: Negative for environmental allergies and food allergies.  Neurological: Negative for dizziness, light-headedness and headaches.  Psychiatric/Behavioral: Negative for dysphoric mood. The patient is not nervous/anxious.   All other systems reviewed and are negative.    Physical Exam  BP 112/62 (BP Location: Left Arm, Cuff Size: Normal)   Pulse 83   Temp 99.1 F (37.3 C) (Oral)   Ht 5\' 2"  (1.575 m)   Wt 165 lb 12.8 oz (75.2 kg)   SpO2 97%   BMI 30.33 kg/m   Wt Readings from Last 5 Encounters:  03/27/19 165 lb 12.8 oz (75.2 kg)  03/23/18 160 lb 6.4 oz (72.8 kg)  03/14/18 160 lb (72.6 kg)  02/27/18 155 lb 11.2 oz (70.6 kg)  02/23/18 152 lb 6.4 oz (69.1 kg)    Physical Exam  Constitutional: She is oriented to person, place, and time and well-developed,  well-nourished, and in no distress. No distress.  HENT:  Head: Normocephalic and atraumatic.  Right Ear: Hearing, tympanic membrane, external ear and ear canal normal.  Left Ear: Hearing, tympanic membrane, external ear and ear canal normal.  Nose: Nose normal.  Mouth/Throat: Uvula is midline and oropharynx is clear and moist. No oropharyngeal exudate.  Eyes: Pupils are equal, round, and reactive to light.  Neck: Normal range of motion. Neck supple.  Cardiovascular: Normal rate, regular rhythm and normal heart sounds.  Pulmonary/Chest: Effort normal and breath sounds normal. No accessory muscle usage. No respiratory distress. She has no decreased breath sounds. She has no wheezes. She has no rhonchi. She has no rales.  Scattered squeaks bilaterally, no audible wheezing or rails  Abdominal: Soft. Bowel sounds  are normal. She exhibits no distension. There is no abdominal tenderness.  Musculoskeletal: Normal range of motion.        General: No edema.  Lymphadenopathy:    She has no cervical adenopathy.  Neurological: She is alert and oriented to person, place, and time. Gait normal.  Tolerated walk in office  Skin: Skin is warm and dry. She is not diaphoretic. No erythema.  Psychiatric: Mood, memory, affect and judgment normal.  Nursing note and vitals reviewed.     Lab Results:  CBC    Component Value Date/Time   WBC 8.9 02/10/2018 0430   RBC 4.23 02/10/2018 0430   HGB 11.1 (L) 02/10/2018 0430   HCT 35.3 (L) 02/10/2018 0430   PLT 357 02/10/2018 0430   MCV 83.5 02/10/2018 0430   MCH 26.2 02/10/2018 0430   MCHC 31.4 02/10/2018 0430   RDW 14.2 02/10/2018 0430   LYMPHSABS 3.8 11/01/2017 1622   MONOABS 0.9 11/01/2017 1622   EOSABS 0.7 11/01/2017 1622   BASOSABS 0.2 (H) 11/01/2017 1622    BMET    Component Value Date/Time   NA 140 02/10/2018 0430   NA 139 12/06/2017 1600   K 3.4 (L) 02/10/2018 0430   CL 105 02/10/2018 0430   CO2 27 02/10/2018 0430   GLUCOSE 93 02/10/2018  0430   BUN 10 02/10/2018 0430   BUN 11 12/06/2017 1600   CREATININE 0.85 02/10/2018 0430   CALCIUM 8.3 (L) 02/10/2018 0430   GFRNONAA >60 02/10/2018 0430   GFRAA >60 02/10/2018 0430    BNP No results found for: BNP  ProBNP    Component Value Date/Time   PROBNP 124.4 03/15/2014 1024      Assessment & Plan:   ILD (interstitial lung disease) ([facility]) Assessment: Known ILD, high-resolution CT chest NSIP versus chronic HP May/2019 lung biopsy favors NSIP Patient was maintained on long-term prednisone Patient completed a walk in office today with no complications and no oxygen desaturations on room air Patient is only having shortness of breath with physical exertion when she is outside in heat Patient has had evaluation by rheumatology 10 years ago for suspected rheumatoid arthritis and she reports that this was negative and told not to follow back up this was Dr. Estanislado Pandy Few scattered squeaks on exam today, no audible wheezing or crackles  Plan: Repeat CT chest high-res Reschedule spirometry with DLCO Okay to stop prednisone at this time 4 to 6-week follow-up with Dr. Chase Caller, if nothing is available with Dr. Chase Caller in clinic then okay to schedule with Wyn Quaker, FNP Can consider referral back to rheumatology if high-resolution CT chest has worsened    NSIP (nonspecific interstitial pneumonia) [facility]) Assessment: February/2019 high-resolution CT chest that favors NSIP or mild chronic HP May/2019 lung biopsy favors NSIP Patient recently completed prednisone Tolerated walk in office today  Plan: Repeat high-resolution CT chest Complete spirometry with DLCO over the next 4 to 6 weeks  Gastroesophageal reflux disease Plan: Start lifestyle modifications for management of occasional acid reflux Review information on AVS If symptoms persist despite lifestyle modification can start over-the-counter PPI  Personal history of rheumatoid arthritis Assessment: Patient  reports bilateral joint pain that improves within the first 30 minutes to an hour as she gets up Patient does not have swan-neck nodules on exam today Patient reports that 10 years ago she was evaluated by rheumatology and found to be negative for RA Recent negative CCP and rheumatoid factor in February/2019  Plan: Continue to monitor clinically, could consider referral  back to rheumatology for reevaluation if high resolution CT chest shows worsened NSIP    Return in about 6 weeks (around 05/08/2019), or if symptoms worsen or fail to improve, for Follow up with Dr. Purnell Shoemaker, Follow up for PFT.   Lauraine Rinne, NP 03/27/2019   This appointment was 32 minutes long with over 50% of the time in direct face-to-face patient care, assessment, plan of care, and follow-up.

## 2019-03-27 ENCOUNTER — Other Ambulatory Visit: Payer: Self-pay

## 2019-03-27 ENCOUNTER — Encounter: Payer: Self-pay | Admitting: Pulmonary Disease

## 2019-03-27 ENCOUNTER — Ambulatory Visit (INDEPENDENT_AMBULATORY_CARE_PROVIDER_SITE_OTHER): Payer: Medicare Other | Admitting: Pulmonary Disease

## 2019-03-27 VITALS — BP 112/62 | HR 83 | Temp 99.1°F | Ht 62.0 in | Wt 165.8 lb

## 2019-03-27 DIAGNOSIS — J8489 Other specified interstitial pulmonary diseases: Secondary | ICD-10-CM | POA: Diagnosis not present

## 2019-03-27 DIAGNOSIS — Z8739 Personal history of other diseases of the musculoskeletal system and connective tissue: Secondary | ICD-10-CM

## 2019-03-27 DIAGNOSIS — K219 Gastro-esophageal reflux disease without esophagitis: Secondary | ICD-10-CM

## 2019-03-27 DIAGNOSIS — J849 Interstitial pulmonary disease, unspecified: Secondary | ICD-10-CM

## 2019-03-27 NOTE — Assessment & Plan Note (Signed)
Assessment: February/2019 high-resolution CT chest that favors NSIP or mild chronic HP May/2019 lung biopsy favors NSIP Patient recently completed prednisone Tolerated walk in office today  Plan: Repeat high-resolution CT chest Complete spirometry with DLCO over the next 4 to 6 weeks

## 2019-03-27 NOTE — Assessment & Plan Note (Signed)
Assessment: Patient reports bilateral joint pain that improves within the first 30 minutes to an hour as she gets up Patient does not have swan-neck nodules on exam today Patient reports that 10 years ago she was evaluated by rheumatology and found to be negative for RA Recent negative CCP and rheumatoid factor in February/2019  Plan: Continue to monitor clinically, could consider referral back to rheumatology for reevaluation if high resolution CT chest shows worsened NSIP

## 2019-03-27 NOTE — Patient Instructions (Addendum)
Repeat High Res Chest CT over next 4-6 weeks   Repeat Breathing test    Handicap placard today  >>>temporaty 6 months for summer heat / trip, can evaluate more after CT and Breathing test   Ok to stop prednisone for right now   GERD management: >>>Avoid laying flat until 2 hours after meals >>>Elevate head of the bed including entire chest >>>Reduce size of meals and amount of fat, acid, spices, caffeine and sweets >>>If you are smoking, Please stop! >>>Decrease alcohol consumption >>>Work on maintaining a healthy weight with normal BMI   If symptoms do not improve despite lifestyle changes for acid reflux then can purchase over-the-counter and start:  Omeprazole 20 mg tablet  >>>Please take 1 tablet daily 15 minutes to 30 minutes before your first meal of the day as well as before your other medications >>>Try to take at the same time each day >>>take this medication daily   Return in about 6 weeks (around 05/08/2019), or if symptoms worsen or fail to improve, for Follow up with Dr. Purnell Shoemaker, Follow up for PFT.   Coronavirus (COVID-19) Are you at risk?  Are you at risk for the Coronavirus (COVID-19)?  To be considered HIGH RISK for Coronavirus (COVID-19), you have to meet the following criteria:  . Traveled to [place], [place], [place], [place] or [place]; or in the [place] to [place], [place], [place], or [place]; and have fever, cough, and shortness of breath within the last 2 weeks of travel OR . Been in close contact with a person diagnosed with COVID-19 within the last 2 weeks and have fever, cough, and shortness of breath . IF YOU DO NOT MEET THESE CRITERIA, YOU ARE CONSIDERED LOW RISK FOR COVID-19.  What to do if you are HIGH RISK for COVID-19?  Marland Kitchen If you are having a medical emergency, call 911. . Seek medical care right away. Before you go to a doctor's office, urgent care or emergency department, call ahead and tell them about your recent travel,  contact with someone diagnosed with COVID-19, and your symptoms. You should receive instructions from your physician's office regarding next steps of care.  . When you arrive at healthcare provider, tell the healthcare staff immediately you have returned from visiting [place], [place], [place], [place] or [place]; or traveled in the [place] to [place], [place], [place], or [place]; in the last two weeks or you have been in close contact with a person diagnosed with COVID-19 in the last 2 weeks.   . Tell the health care staff about your symptoms: fever, cough and shortness of breath. . After you have been seen by a medical provider, you will be either: o Tested for (COVID-19) and discharged home on quarantine except to seek medical care if symptoms worsen, and asked to  - Stay home and avoid contact with others until you get your results (4-5 days)  - Avoid travel on public transportation if possible (such as bus, train, or airplane) or o Sent to the Emergency Department by EMS for evaluation, COVID-19 testing, and possible admission depending on your condition and test results.  What to do if you are LOW RISK for COVID-19?  Reduce your risk of any infection by using the same precautions used for avoiding the common cold or flu:  Marland Kitchen Wash your hands often with soap and warm water for at least 20 seconds.  If soap and water are not readily available, use an alcohol-based hand sanitizer  with at least 60% alcohol.  . If coughing or sneezing, cover your mouth and nose by coughing or sneezing into the elbow areas of your shirt or coat, into a tissue or into your sleeve (not your hands). . Avoid shaking hands with others and consider head nods or verbal greetings only. . Avoid touching your eyes, nose, or mouth with unwashed hands.  . Avoid close contact with people who are sick. . Avoid places or events with large numbers of people in one location, like concerts or sporting events. . Carefully  consider travel plans you have or are making. . If you are planning any travel outside or inside the [place], visit the CDC's Travelers' Health webpage for the latest health notices. . If you have some symptoms but not all symptoms, continue to monitor at home and seek medical attention if your symptoms worsen. . If you are having a medical emergency, call 911.   [place] / e-Visit: [email or web address]         [facility]: [facility]: [phone]                   [facility]: [phone]           It is flu season:   >>> Best ways to protect herself from the flu: Receive the yearly flu vaccine, practice good hand hygiene washing with soap and also using hand sanitizer when available, eat a nutritious meals, get adequate rest, hydrate appropriately   Please contact the office if your symptoms worsen or you have concerns that you are not improving.   Thank you for choosing [facility] for your healthcare, and for allowing [place] to partner with you on your healthcare journey. I am thankful to be able to provide care to you today.   Wyn Quaker FNP-C    Gastroesophageal Reflux Disease, Adult Gastroesophageal reflux (GER) happens when acid from the stomach flows up into the tube that connects the mouth and the stomach (esophagus). Normally, food travels down the esophagus and stays in the stomach to be digested. With GER, food and stomach acid sometimes move back up into the esophagus. You may have a disease called gastroesophageal reflux disease (GERD) if the reflux:  Happens often.  Causes frequent or very bad symptoms.  Causes problems such as damage to the esophagus. When this happens, the esophagus becomes sore and swollen (inflamed). Over time, GERD can make small holes (ulcers) in the lining of the esophagus. What  are the causes? This condition is caused by a problem with the muscle between the esophagus and the stomach. When this muscle is weak or not normal, it does not close properly to keep food and acid from coming back up from the stomach. The muscle can be weak because of:  Tobacco use.  Pregnancy.  Having a certain type of hernia (hiatal hernia).  Alcohol use.  Certain foods and drinks, such as coffee, chocolate, onions, and peppermint. What increases the risk? You are more likely to develop this condition if you:  Are overweight.  Have a disease that affects your connective tissue.  Use NSAID medicines. What are the signs or symptoms? Symptoms of this condition include:  Heartburn.  Difficult or painful swallowing.  The feeling of having a lump in the throat.  A bitter taste in the mouth.  Bad breath.  Having a lot of saliva.  Having an upset  or bloated stomach.  Belching.  Chest pain. Different conditions can cause chest pain. Make sure you see your doctor if you have chest pain.  Shortness of breath or noisy breathing (wheezing).  Ongoing (chronic) cough or a cough at night.  Wearing away of the surface of teeth (tooth enamel).  Weight loss. How is this treated? Treatment will depend on how bad your symptoms are. Your doctor may suggest:  Changes to your diet.  Medicine.  Surgery. Follow these instructions at home: Eating and drinking   Follow a diet as told by your doctor. You may need to avoid foods and drinks such as: ? Coffee and tea (with or without caffeine). ? Drinks that contain alcohol. ? Energy drinks and sports drinks. ? Bubbly (carbonated) drinks or sodas. ? Chocolate and cocoa. ? Peppermint and mint flavorings. ? Garlic and onions. ? Horseradish. ? Spicy and acidic foods. These include peppers, chili powder, curry powder, vinegar, hot sauces, and BBQ sauce. ? Citrus fruit juices and citrus fruits, such as oranges, lemons, and  limes. ? Tomato-based foods. These include red sauce, chili, salsa, and pizza with red sauce. ? Fried and fatty foods. These include donuts, french fries, potato chips, and high-fat dressings. ? High-fat meats. These include hot dogs, rib eye steak, sausage, ham, and bacon. ? High-fat dairy items, such as whole milk, butter, and cream cheese.  Eat small meals often. Avoid eating large meals.  Avoid drinking large amounts of liquid with your meals.  Avoid eating meals during the 2-3 hours before bedtime.  Avoid lying down right after you eat.  Do not exercise right after you eat. Lifestyle   Do not use any products that contain nicotine or tobacco. These include cigarettes, e-cigarettes, and chewing tobacco. If you need help quitting, ask your doctor.  Try to lower your stress. If you need help doing this, ask your doctor.  If you are overweight, lose an amount of weight that is healthy for you. Ask your doctor about a safe weight loss goal. General instructions  Pay attention to any changes in your symptoms.  Take over-the-counter and prescription medicines only as told by your doctor. Do not take aspirin, ibuprofen, or other NSAIDs unless your doctor says it is okay.  Wear loose clothes. Do not wear anything tight around your waist.  Raise (elevate) the head of your bed about 6 inches (15 cm).  Avoid bending over if this makes your symptoms worse.  Keep all follow-up visits as told by your doctor. This is important. Contact a doctor if:  You have new symptoms.  You lose weight and you do not know why.  You have trouble swallowing or it hurts to swallow.  You have wheezing or a cough that keeps happening.  Your symptoms do not get better with treatment.  You have a hoarse voice. Get help right away if:  You have pain in your arms, neck, jaw, teeth, or back.  You feel sweaty, dizzy, or light-headed.  You have chest pain or shortness of breath.  You throw up  (vomit) and your throw-up looks like blood or coffee grounds.  You pass out (faint).  Your poop (stool) is bloody or black.  You cannot swallow, drink, or eat. Summary  If a person has gastroesophageal reflux disease (GERD), food and stomach acid move back up into the esophagus and cause symptoms or problems such as damage to the esophagus.  Treatment will depend on how bad your symptoms are.  Follow a  diet as told by your doctor.  Take all medicines only as told by your doctor. This information is not intended to replace advice given to you by your health care provider. Make sure you discuss any questions you have with your health care provider. Document Released: 03/01/2008 Document Revised: 03/22/2018 Document Reviewed: 03/22/2018 Elsevier Patient Education  2020 [employer] for Gastroesophageal Reflux Disease, Adult When you have gastroesophageal reflux disease (GERD), the foods you eat and your eating habits are very important. Choosing the right foods can help ease your discomfort. Think about working with a nutrition specialist (dietitian) to help you make good choices. What are tips for following this plan?  Meals  Choose healthy foods that are low in fat, such as fruits, vegetables, whole grains, low-fat dairy products, and lean meat, fish, and poultry.  Eat small meals often instead of 3 large meals a day. Eat your meals slowly, and in a place where you are relaxed. Avoid bending over or lying down until 2-3 hours after eating.  Avoid eating meals 2-3 hours before bed.  Avoid drinking a lot of liquid with meals.  Cook foods using methods other than frying. Bake, grill, or broil food instead.  Avoid or limit: ? Chocolate. ? Peppermint or spearmint. ? Alcohol. ? Pepper. ? Black and decaffeinated coffee. ? Black and decaffeinated tea. ? Bubbly (carbonated) soft drinks. ? Caffeinated energy drinks and soft drinks.  Limit high-fat foods such  as: ? Fatty meat or fried foods. ? Whole milk, cream, butter, or ice cream. ? Nuts and nut butters. ? Pastries, donuts, and sweets made with butter or shortening.  Avoid foods that cause symptoms. These foods may be different for everyone. Common foods that cause symptoms include: ? Tomatoes. ? Oranges, lemons, and limes. ? Peppers. ? Spicy food. ? Onions and garlic. ? Vinegar. Lifestyle  Maintain a healthy weight. Ask your doctor what weight is healthy for you. If you need to lose weight, work with your doctor to do so safely.  Exercise for at least 30 minutes for 5 or more days each week, or as told by your doctor.  Wear loose-fitting clothes.  Do not smoke. If you need help quitting, ask your doctor.  Sleep with the head of your bed higher than your feet. Use a wedge under the mattress or blocks under the bed frame to raise the head of the bed. Summary  When you have gastroesophageal reflux disease (GERD), food and lifestyle choices are very important in easing your symptoms.  Eat small meals often instead of 3 large meals a day. Eat your meals slowly, and in a place where you are relaxed.  Limit high-fat foods such as fatty meat or fried foods.  Avoid bending over or lying down until 2-3 hours after eating.  Avoid peppermint and spearmint, caffeine, alcohol, and chocolate. This information is not intended to replace advice given to you by your health care provider. Make sure you discuss any questions you have with your health care provider. Document Released: 03/14/2012 Document Revised: 01/04/2019 Document Reviewed: 10/19/2016 Elsevier Patient Education  2020 [employer].

## 2019-03-27 NOTE — Assessment & Plan Note (Signed)
Plan: Start lifestyle modifications for management of occasional acid reflux Review information on AVS If symptoms persist despite lifestyle modification can start over-the-counter PPI

## 2019-03-27 NOTE — Assessment & Plan Note (Addendum)
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 Estanislado Pandy Few scattered squeaks on exam today, no audible wheezing or crackles  Plan: Repeat CT chest high-res Reschedule spirometry with DLCO Okay to stop prednisone at this time 4 to 6-week follow-up with Dr. Chase Caller, if nothing is available with Dr. Chase Caller in clinic then okay to schedule with Wyn Quaker, FNP Can consider referral back to rheumatology if high-resolution CT chest has worsened

## 2019-04-25 ENCOUNTER — Telehealth: Payer: Self-pay | Admitting: Internal Medicine

## 2019-04-26 NOTE — Telephone Encounter (Signed)
ATC to call both numbers to schedule PFT - no answer and no answering machine -pr

## 2019-04-27 NOTE — Telephone Encounter (Signed)
Sharon [name] is working on scheduling

## 2019-04-30 ENCOUNTER — Other Ambulatory Visit: Payer: Self-pay | Admitting: Internal Medicine

## 2019-04-30 NOTE — Telephone Encounter (Signed)
Scheduled patient for PFT on 05/11/2019-pr

## 2019-05-01 ENCOUNTER — Ambulatory Visit (INDEPENDENT_AMBULATORY_CARE_PROVIDER_SITE_OTHER)
Admission: RE | Admit: 2019-05-01 | Discharge: 2019-05-01 | Disposition: A | Discharge status: Home / Self Care | Payer: Medicare Other | Source: Ambulatory Visit | Attending: Pulmonary Disease | Admitting: Pulmonary Disease

## 2019-05-01 ENCOUNTER — Other Ambulatory Visit: Payer: Self-pay

## 2019-05-01 DIAGNOSIS — J849 Interstitial pulmonary disease, unspecified: Secondary | ICD-10-CM | POA: Diagnosis not present

## 2019-05-01 NOTE — Progress Notes (Signed)
CT chest results have come back.  Still showing likely NSIP.  This is what was shown based off of your biopsy.  The good news is that CT imaging has not progressed.  No changes in plan of care at this time.  Keep scheduled follow-up with [place] later on this month with pulmonary function testing.  We can discuss this further in person Aaron Edelman

## 2019-05-02 ENCOUNTER — Telehealth: Payer: Self-pay | Admitting: Pulmonary Disease

## 2019-05-02 MED ORDER — PREDNISONE 10 MG PO TABS: ORAL_TABLET | ORAL | 0 refills | Status: DC

## 2019-05-02 NOTE — Telephone Encounter (Signed)
I sent in prednisone.patient will need a follow up with APP in 2 weeks.

## 2019-05-02 NOTE — Telephone Encounter (Signed)
Called and spoke with pt letting her know pred taper had been sent in for her. Pt verbalized understanding. Nothing further needed.

## 2019-05-02 NOTE — Telephone Encounter (Signed)
Primary Pulmonologist: MR Last office visit and with whom: 03/27/2019 with Aaron Edelman What do we see them for (pulmonary problems): ILD Last OV assessment/plan: Instructions    Return in about 6 weeks (around 05/08/2019), or if symptoms worsen or fail to improve, for Follow up with Dr. Purnell Shoemaker, Follow up for PFT. Repeat High Res Chest CT over next 4-6 weeks   Repeat Breathing test    Handicap placard today  >>>temporaty 6 months for summer heat / trip, can evaluate more after CT and Breathing test   Ok to stop prednisone for right now   GERD management: >>>Avoid laying flat until 2 hours after meals >>>Elevate head of the bed including entire chest >>>Reduce size of meals and amount of fat, acid, spices, caffeine and sweets >>>If you are smoking, Please stop! >>>Decrease alcohol consumption >>>Work on maintaining a healthy weight with normal BMI   If symptoms do not improve despite lifestyle changes for acid reflux then can purchase over-the-counter and start:  Omeprazole 20 mg tablet  >>>Please take 1 tablet daily 15 minutes to 30 minutes before your first meal of the day as well as before your other medications >>>Try to take at the same time each day >>>take this medication daily   Return in about 6 weeks (around 05/08/2019), or if symptoms worsen or fail to improve, for Follow up with Dr. Purnell Shoemaker, Follow up for PFT.     Was appointment offered to patient (explain)?  Pt requesting  pred Rx   Reason for call: called and spoke with pt who stated her cough and wheezing has become worse and has been worse x1 week. Pt stated due to the heat from going to [facility] made her symptoms become worse. Pt is using nebulizer at least once daily and is also taking mucinex which she says does help with her symptoms but is still having problems.  Pt said while she was on vacation, she called PCP and was prescribed pred taper. Pt said she finished the pred a week ago but since  finsihing the Rx, her symptoms came back. Pt said the pred taper did help but now her cough and wheezing is worse all over again.  Pt denies any complaints of fever. Pt said that she does develop SOB and tightness in chest when she has a coughing attack.   Pt denies any complaints of loss of smell or loss of taste, no weakness, no headache, no muscle or abdominal pain, no sore throat or rash, no nausea or vomiting.  Pt has not had to be tested for covid but will be getting tested 8/10 due to having PFT on 8/14.  Pt is requesting to have a pred Rx called into pharmacy for her.  Tonya, please advise on this for pt. Thanks!  (examples of things to ask: : When did symptoms start? Fever? Cough? Productive? Color to sputum? More sputum than usual? Wheezing? Have you needed increased oxygen? Are you taking your respiratory medications? What over the counter measures have you tried?)

## 2019-05-07 ENCOUNTER — Other Ambulatory Visit (HOSPITAL_COMMUNITY)
Admission: RE | Admit: 2019-05-07 | Discharge: 2019-05-07 | Disposition: A | Discharge status: Home / Self Care | Payer: Medicare Other | Source: Ambulatory Visit | Attending: Internal Medicine | Admitting: Internal Medicine

## 2019-05-07 DIAGNOSIS — Z20828 Contact with and (suspected) exposure to other viral communicable diseases: Secondary | ICD-10-CM | POA: Diagnosis not present

## 2019-05-07 DIAGNOSIS — Z01812 Encounter for preprocedural laboratory examination: Secondary | ICD-10-CM | POA: Insufficient documentation

## 2019-05-07 LAB — SARS CORONAVIRUS 2 (TAT 6-24 HRS): SARS Coronavirus 2: NEGATIVE

## 2019-05-08 ENCOUNTER — Other Ambulatory Visit: Payer: Self-pay

## 2019-05-08 ENCOUNTER — Ambulatory Visit
Admission: RE | Admit: 2019-05-08 | Discharge: 2019-05-08 | Disposition: A | Discharge status: Home / Self Care | Payer: Medicare Other | Source: Ambulatory Visit | Attending: Nurse Practitioner | Admitting: Nurse Practitioner

## 2019-05-08 DIAGNOSIS — Z1231 Encounter for screening mammogram for malignant neoplasm of breast: Secondary | ICD-10-CM

## 2019-05-08 DIAGNOSIS — Z7952 Long term (current) use of systemic steroids: Secondary | ICD-10-CM

## 2019-05-11 ENCOUNTER — Ambulatory Visit (INDEPENDENT_AMBULATORY_CARE_PROVIDER_SITE_OTHER): Payer: Medicare Other | Admitting: Pulmonary Disease

## 2019-05-11 ENCOUNTER — Ambulatory Visit (INDEPENDENT_AMBULATORY_CARE_PROVIDER_SITE_OTHER): Payer: Medicare Other | Admitting: Internal Medicine

## 2019-05-11 ENCOUNTER — Encounter: Payer: Self-pay | Admitting: Pulmonary Disease

## 2019-05-11 ENCOUNTER — Other Ambulatory Visit: Payer: Self-pay

## 2019-05-11 VITALS — BP 124/78 | HR 86 | Temp 97.3°F | Ht 62.0 in | Wt 163.0 lb

## 2019-05-11 DIAGNOSIS — J849 Interstitial pulmonary disease, unspecified: Secondary | ICD-10-CM

## 2019-05-11 DIAGNOSIS — J8489 Other specified interstitial pulmonary diseases: Secondary | ICD-10-CM

## 2019-05-11 DIAGNOSIS — J45991 Cough variant asthma: Secondary | ICD-10-CM | POA: Diagnosis not present

## 2019-05-11 LAB — PULMONARY FUNCTION TEST
DL/VA % pred: 115 %
DL/VA: 4.88 ml/min/mmHg/L
DLCO unc % pred: 84 %
DLCO unc: 15.51 ml/min/mmHg
FEF 25-75 Pre: 1.54 L/sec
FEF2575-%Pred-Pre: 80 %
FEV1-%Pred-Pre: 66 %
FEV1-Pre: 1.44 L
FEV1FVC-%Pred-Pre: 107 %
FEV6-%Pred-Pre: 64 %
FEV6-Pre: 1.76 L
FEV6FVC-%Pred-Pre: 104 %
FVC-%Pred-Pre: 61 %
FVC-Pre: 1.76 L
Pre FEV1/FVC ratio: 82 %
Pre FEV6/FVC Ratio: 100 %

## 2019-05-11 MED ORDER — BUDESONIDE-FORMOTEROL FUMARATE 80-4.5 MCG/ACT IN AERO: 2.0000 | INHALATION_SPRAY | Freq: Two times a day (BID) | RESPIRATORY_TRACT | 0 refills | Status: DC

## 2019-05-11 MED ORDER — BUDESONIDE-FORMOTEROL FUMARATE 80-4.5 MCG/ACT IN AERO: INHALATION_SPRAY | RESPIRATORY_TRACT | 6 refills | Status: DC

## 2019-05-11 MED ORDER — PREDNISONE 10 MG PO TABS: ORAL_TABLET | ORAL | 0 refills | Status: DC

## 2019-05-11 NOTE — Assessment & Plan Note (Signed)
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 Estanislado Pandy Few scattered squeaks on exam today, with audible wheezing, no crackles  Plan: Restart prednisone, start 30 mg daily for 7 days, then 20 mg daily for 7 days, then maintain 10 mg daily next follow-up Follow-up with our office in 2 months Spirometry with DLCO in 6 months

## 2019-05-11 NOTE — Patient Instructions (Addendum)
You were seen today by Lauraine Rinne, NP  for:   1. ILD (interstitial lung disease) (HCC)  - predniSONE (DELTASONE) 10 MG tablet; Take 3 tablets (30 mg total) daily for 7 days, then take 2 tablets (20 mg total) daily for 7 days, then start taking 1 tablet 10 mg daily  Dispense: 95 tablet; Refill: 0 - Pulmonary function test; Future  2. 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HCC)  - predniSONE (DELTASONE) 10 MG tablet; Take 3 tablets (30 mg total) daily for 7 days, then take 2 tablets (20 mg total) daily for 7 days, then start taking 1 tablet 10 mg daily  Dispense: 95 tablet; Refill: 0 - Pulmonary function test; Future  Flu vaccine in fall / 2020  We recommend today:  Orders Placed This Encounter  Procedures  . Pulmonary function test    Standing Status:   Future    Standing Expiration Date:   05/10/2020    Scheduling Instructions:     Arlyce Harman with DLCO    Order Specific Question:   Where should this test be performed?    Answer:   [facility]   Orders Placed This Encounter  Procedures  . Pulmonary function test   Meds ordered this encounter  Medications  . predniSONE (DELTASONE) 10 MG tablet    Sig: Take 3 tablets (30 mg total) daily for 7 days, then take 2 tablets (20 mg total) daily for 7 days, then start taking 1 tablet 10 mg daily    Dispense:  95 tablet    Refill:  0    Follow Up:    Return in about 2 months (around 07/11/2019), or if symptoms worsen or fail to improve, for Follow up with Dr. Purnell Shoemaker.   Please do your part to reduce the spread of COVID-19:      Reduce your risk of any infection  and COVID19 by using the similar precautions used for avoiding the common cold or flu:  Marland Kitchen Wash your hands often with soap and warm water for at least 20 seconds.  If soap and water are not readily available, use an alcohol-based hand sanitizer with at least 60% alcohol.  . If coughing or sneezing, cover your mouth and nose by coughing or sneezing into the elbow  areas of your shirt or coat, into a tissue or into your sleeve (not your hands). Langley Gauss A MASK when in public  . Avoid shaking hands with others and consider head nods or verbal greetings only. . Avoid touching your eyes, nose, or mouth with unwashed hands.  . Avoid close contact with people who are sick. . Avoid places or events with large numbers of people in one location, like concerts or sporting events. . If you have some symptoms but not all symptoms, continue to monitor at home and seek medical attention if your symptoms worsen. . If you are having a medical emergency, call 911.   [place] / e-Visit: [email or web address]         [facility]: [facility]: [phone]                   [facility]: [phone]     It is flu season:   >>> Best ways to protect herself from the flu: Receive the yearly flu vaccine, practice good hand hygiene washing with soap and also using hand sanitizer when available, eat a nutritious meals, get adequate  rest, hydrate appropriately   Please contact the office if your symptoms worsen or you have concerns that you are not improving.   Thank you for choosing [facility] for your healthcare, and for allowing [place] to partner with you on your healthcare journey. I am thankful to be able to provide care to you today.   Wyn Quaker FNP-C

## 2019-05-11 NOTE — Assessment & Plan Note (Signed)
Plan: Continue Symbicort 80 

## 2019-05-11 NOTE — Progress Notes (Signed)
Spiro and DLCO performed today. 

## 2019-05-11 NOTE — Assessment & Plan Note (Signed)
Assessment: February/2019 high-resolution CT chest that favors NSIP or mild chronic HP May/2019 lung biopsy favors NSIP Patient recently completed prednisone Tolerated walk in office today August/2020 high-resolution CT chest still favors NSIP, stable from 2018  Plan: Walk in office today Resume prednisone -30 mg daily for 7 days, then 20 mg daily for 7 days, then 10 mg daily till next follow-up Follow-up in 2 months Spirometry with DLCO in 6 months

## 2019-05-11 NOTE — Progress Notes (Signed)
@Patient  ID: Sharon [name], female    DOB: [birth year], 68 y.o.   MRN: [id number]  Chief Complaint  Patient presents with  . Follow-up    F/U for PFT results. States that her breathing has been ok since last visit.     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  Biopsy in May/2019 shows NSIP.  Smoker/ Smoking History: Former smoker.  Quit 1980.  35-pack-year smoking history. Maintenance: Symbicort 80 Pt of: Dr. Chase Caller  05/11/2019  - Visit   68 year old female former smoker followed in our office for ILD.  High-resolution CT chest recently completed in August/2020 favors alternative ATS diagnosis of NSIP.  May/2019 biopsy also shows NSIP.  Patient presenting today for follow-up after completing spirometry with DLCO.  Those results are listed below:  05/11/2019-spirometry with DLCO-FVC 1.76 (61% predicted), ratio 82, FEV1 1.44 (66% predicted), DLCO 15.51 (84% predicted)  Patient has been off of maintenance steroids since last office visit.  Patient reports that she feels that her breathing has been worse since she is been off of steroids.  She has had 2 tapers of steroids one from her primary care one from our office telephonically.  She feels that she has been coughing and wheezing more as of late.  Patient would prefer to get back on daily steroids.  She continues to be maintained on Symbicort 80.    Tests:   02/08/2018- lung wedge biopsy-surgical pathology- nonspecific interstitial pneumonia, mixed cellular patterns  12/13/2017- RNP antibodies-0.8 Sjogren's syndrome antibodies-negative  MPO/PR 3 ANCA antibodies-1.2 (antibody detected and elevated)  11/01/2017-sed rate-30 11/01/2017- ANA- negative  11/01/2017- anti-DNA antibody double-stranded-negative 11/01/2017- rheumatoid factor- less than 14 11/01/2017-CCP- negative  11/01/2017- ANCA-negative 11/01/2017-  hypersensitivity pneumonitis panel-negative  11/14/2017-CT chest high-res- pulmonary parenchymal pattern of fibrotic interstitial lung disease is unchanged from 02/07/2017 and may be due to NSIP or mild chronic HP, findings are not consistent with usual interstitial pneumonitis  05/01/2019-CT chest high-res- redemonstrated pattern of atypical predominant mild pulmonary fibrosis featuring mild traction bronchiectasis, subpleural irregular groundglass and occasional areas of bronchiolectasis with some elements of peribronchial vascular groundglass and fibrotic architectural distortion particular in left upper lobe, evidence of interval left lung wedge biopsies, no evidence of air trapping, findings are not significantly changed from 02/07/2017 CT, remain alternative diagnosis consistent with NSIP, stable benign small pulmonary nodules  05/18/2018-spirometry- FVC 2.06 (71% predicted), ratio 89, FEV1 1.83 (83% predicted) Normal spirometry, restriction is possible  05/11/2019-spirometry with DLCO-FVC 1.76 (61% predicted), ratio 82, FEV1 1.44 (66% predicted), DLCO 15.51 (84% predicted)  12/12/2017--echocardiogram-LV ejection fraction 60 to 65%, mild LVH, grade 1 diastolic dysfunction  SIX MIN WALK 05/11/2019 03/27/2019 05/18/2018 02/23/2018 11/01/2017  Supplimental Oxygen during Test? (L/min) No No No No No  Tech Comments: Patient was able to complete 2 laps without stopping. Denied any chest pain, SOB or leg pain during or after walk. No O2 was needed during or after walk. patient started walk with an O2 sat of 99% and HR of 89.  By the end of 3 laps patient had an O2 sat of 97% and HR 102. she tolerated well and was able to keep conversation the whole walk. no complaints. Completed 3 laps at a normal pace. No complaints. Pt walked at a slow pace completing all required laps. Only complaint was mild SOB. Pt walked at a normal pace completing all required laps.  Pt did not state  any real complaints during walk.     FENO:  Lab Results  Component Value Date   NITRICOXIDE 48 11/01/2017    PFT: PFT Results Latest Ref Rng & Units 05/11/2019 05/18/2018 03/20/2018 12/13/2017  FVC-Pre L 1.76 2.06 2.05 1.65  FVC-Predicted Pre % 61 71 71 58  FVC-Post L - - 2.16 1.94  FVC-Predicted Post % - - 75 68  Pre FEV1/FVC % % 82 89 88 85  Post FEV1/FCV % % - - 92 91  FEV1-Pre L 1.44 1.83 1.80 1.39  FEV1-Predicted Pre % 66 83 82 64  FEV1-Post L - - 1.99 1.76  DLCO UNC% % 84 - - 66  DLCO COR %Predicted % 115 - - 106    Imaging: Ct Chest High Resolution  Result Date: 05/01/2019 CLINICAL DATA:  Interstitial lung disease EXAM: CT CHEST WITHOUT CONTRAST TECHNIQUE: Multidetector CT imaging of the chest was performed following the standard protocol without intravenous contrast. High resolution imaging of the lungs, as well as inspiratory and expiratory imaging, was performed. COMPARISON:  11/14/2017, 02/07/2017 FINDINGS: Cardiovascular: Aortic atherosclerosis. Normal heart size. Three-vessel coronary artery calcifications. No pericardial effusion. Mediastinum/Nodes: No enlarged mediastinal, hilar, or axillary lymph nodes. Thyroid gland, trachea, and esophagus demonstrate no significant findings. Lungs/Pleura: There is a redemonstrated pattern of apical predominant mild pulmonary fibrosis featuring mild traction bronchiectasis, subpleural irregular ground-glass and occasional areas of bronchiolectasis with some elements of peribronchovascular ground-glass and fibrotic architectural distortion, particular in the left upper lobe. There is evidence of interval left lung wedge biopsies. No evidence of air trapping on expiratory phase imaging. Stable, benign small pulmonary nodules, for example a 4 mm nodule of the right upper lobe (series 7, image 38). No pleural effusion or pneumothorax. Upper Abdomen: No acute abnormality. Musculoskeletal: No chest wall mass or suspicious bone lesions identified. IMPRESSION: 1. There is a redemonstrated  pattern of apical predominant mild pulmonary fibrosis featuring mild traction bronchiectasis, subpleural irregular ground-glass and occasional areas of bronchiolectasis with some elements of peribronchovascular ground-glass and fibrotic architectural distortion, particular in the left upper lobe. There is evidence of interval left lung wedge biopsies. No evidence of air trapping on expiratory phase imaging. Findings are not significantly changed compared to prior examinations dating back to 02/07/2017 and remain in an "alternate diagnosis" pattern by ATS pulmonary fibrosis criteria, consistent with histologic diagnosis favoring NSIP. 2.  Stable, benign small pulmonary nodules. 3.  Coronary artery disease and aortic atherosclerosis. Electronically Signed   By: Eddie Candle M.D.   On: 05/01/2019 13:25   Mm 3d Screen Breast Bilateral  Result Date: 05/08/2019 CLINICAL DATA:  Screening. EXAM: DIGITAL SCREENING BILATERAL MAMMOGRAM WITH TOMO AND CAD COMPARISON:  Previous exam(s). ACR Breast Density Category b: There are scattered areas of fibroglandular density. FINDINGS: There are no findings suspicious for malignancy. Images were processed with CAD. IMPRESSION: No mammographic evidence of malignancy. A result letter of this screening mammogram will be mailed directly to the patient. RECOMMENDATION: Screening mammogram in one year. (Code:SM-B-01Y) BI-RADS CATEGORY  1: Negative. Electronically Signed   By: Audie Pinto M.D.   On: 05/08/2019 16:09      Specialty Problems      Pulmonary Problems   Cough variant asthma vs UACS     02/03/2016  > try symbicort 80 2bid  - Allergy profile No visit date found. >  Eos 0.2 /  IgE  168 Pos RAST  Dust, trees, grass  - 03/16/2016  After extensive coaching HFA effectiveness =    75%  Pulmonary nodules    CT chest 01/13/16  multiple nodules on the right measuring up to 5 mm  - collagen vasc/hsp profile 02/03/2016  > neg  - CT chest 02/07/2017  Spectrum of  findings are compatible with a chronic interstitial lung disease. Findings are favored to represent subacute on chronic hypersensitivity pneumonitis       ILD (interstitial lung disease) ([place])    02/08/2018- lung wedge biopsy-surgical pathology- nonspecific interstitial pneumonia, mixed cellular patterns  12/13/2017- RNP antibodies-0.8 Sjogren's syndrome antibodies-negative  MPO/PR 3 ANCA antibodies-1.2 (antibody detected and elevated)  11/01/2017-sed rate-30 11/01/2017- ANA- negative  11/01/2017- anti-DNA antibody double-stranded-negative 11/01/2017- rheumatoid factor- less than 14 11/01/2017-CCP- negative  11/01/2017- ANCA-negative 11/01/2017- hypersensitivity pneumonitis panel-negative  11/14/2017-CT chest high-res- pulmonary parenchymal pattern of fibrotic interstitial lung disease is unchanged from 02/07/2017 and may be due to NSIP or mild chronic HP, findings are not consistent with usual interstitial pneumonitis  05/18/2018-spirometry- FVC 2.06 (71% predicted), ratio 89, FEV1 1.83 (83% predicted) Normal spirometry, restriction is possible      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[place])    02/08/2018- lung wedge biopsy-surgical pathology- nonspecific interstitial pneumonia, mixed cellular patterns  11/14/2017-CT chest high-res- pulmonary parenchymal pattern of fibrotic interstitial lung disease is unchanged from 02/07/2017 and may be due to NSIP or mild chronic HP, findings are not consistent with usual interstitial pneumonitis  05/18/2018-spirometry- FVC 2.06 (71% predicted), ratio 89, FEV1 1.83 (83% predicted) Normal spirometry, restriction is possible         Allergies  Allergen Reactions  . Codeine Nausea Only    GI upset     Immunization History  Administered Date(s) Administered  . Influenza, High Dose Seasonal PF 10/25/2017  . Influenza-Unspecified 09/27/2013  . Pneumococcal Conjugate-13 10/25/2017  . Pneumococcal-Unspecified 08/07/2008  . Zoster 12/06/2013    Past  Medical History:  Diagnosis Date  . Anemia    'years ago"  . Chronic major depressive disorder   . DDD (degenerative disc disease), cervical   . Depression   . Dyspnea   . Hypertension   . Interstitial lung disease ([place])   . Mixed hyperlipidemia   . Pain in joints of both feet   . Pneumonia 2016  . Polyarthropathy   . Stroke [facility])    TIA 11/11/17  . Vision abnormalities    catracts left eye  . Vitamin D deficiency     Tobacco History: Social History   Tobacco Use  Smoking Status Former Smoker  . Packs/day: 1.75  . Years: 20.00  . Pack years: 35.00  . Types: Cigarettes  . Start date: 75  . Quit date: 64  . Years since quitting: 29.6  Smokeless Tobacco Never Used   Counseling given: Yes   Continue to not smoke  Outpatient Encounter Medications as of 05/11/2019  Medication Sig  . albuterol (PROVENTIL HFA;VENTOLIN HFA) 108 (90 BASE) MCG/ACT inhaler Inhale 2 puffs into the lungs every 4 (four) hours as needed for wheezing or shortness of breath.  . ALPRAZolam (XANAX) 0.5 MG tablet Take 0.5 mg by mouth daily as needed for anxiety.   Marland Kitchen aspirin EC 81 MG tablet Take 81 mg by mouth daily.  Marland Kitchen atorvastatin (LIPITOR) 40 MG tablet Take 1 tablet (40 mg total) by mouth daily.  . budesonide-formoterol (SYMBICORT) 80-4.5 MCG/ACT inhaler Take 2 puffs first thing in am and then another 2 puffs about 12 hours later.  . calcium carbonate (TUMS EX) 750 MG chewable tablet Chew 1 tablet by mouth daily as needed for heartburn.  Marland Kitchen  clopidogrel (PLAVIX) 75 MG tablet Take 1 tablet (75 mg total) by mouth daily.  Marland Kitchen dextromethorphan-guaiFENesin (MUCINEX DM) 30-600 MG per 12 hr tablet Take 1 tablet by mouth 2 (two) times daily as needed (congestion).   . dorzolamide-timolol (COSOPT) 22.3-6.8 MG/ML ophthalmic solution   . DULoxetine (CYMBALTA) 30 MG capsule Take 60 mg by mouth once daily  . latanoprost (XALATAN) 0.005 % ophthalmic solution Place 1 drop into both eyes at bedtime.  Marland Kitchen losartan  (COZAAR) 50 MG tablet Take 50 mg by mouth daily.   . traMADol (ULTRAM) 50 MG tablet Take by mouth.  . [DISCONTINUED] budesonide-formoterol (SYMBICORT) 80-4.5 MCG/ACT inhaler Take 2 puffs first thing in am and then another 2 puffs about 12 hours later.  . budesonide-formoterol (SYMBICORT) 80-4.5 MCG/ACT inhaler Inhale 2 puffs into the lungs 2 (two) times daily.  . predniSONE (DELTASONE) 10 MG tablet Take 3 tablets (30 mg total) daily for 7 days, then take 2 tablets (20 mg total) daily for 7 days, then start taking 1 tablet 10 mg daily  . [DISCONTINUED] predniSONE (DELTASONE) 10 MG tablet Take 4 tabs for 2 days, then 3 tabs for 2 days, then 2 tabs for 2 days, then 1 tab for 2 days, then stop   No facility-administered encounter medications on file as of 05/11/2019.      Review of Systems  Review of Systems  Constitutional: Negative for activity change, fatigue and fever.  HENT: Negative for sinus pressure, sinus pain and sore throat.   Respiratory: Positive for cough, shortness of breath and wheezing.   Cardiovascular: Negative for chest pain and palpitations.  Gastrointestinal: Negative for diarrhea, nausea and vomiting.  Musculoskeletal: Negative for arthralgias.  Neurological: Negative for dizziness.  Psychiatric/Behavioral: Negative for sleep disturbance. The patient is not nervous/anxious.      Physical Exam  BP 124/78   Pulse 86   Temp (!) 97.3 F (36.3 C) (Oral)   Ht 5\' 2"  (1.575 m)   Wt 163 lb (73.9 kg)   SpO2 96%   BMI 29.81 kg/m   Wt Readings from Last 5 Encounters:  05/11/19 163 lb (73.9 kg)  03/27/19 165 lb 12.8 oz (75.2 kg)  03/23/18 160 lb 6.4 oz (72.8 kg)  03/14/18 160 lb (72.6 kg)  02/27/18 155 lb 11.2 oz (70.6 kg)     Physical Exam Vitals signs and nursing note reviewed.  Constitutional:      General: She is not in acute distress.    Appearance: Normal appearance.  HENT:     Head: Normocephalic and atraumatic.     Right Ear: Tympanic membrane, ear  canal and external ear normal.     Left Ear: Tympanic membrane, ear canal and external ear normal.     Nose: Nose normal. No congestion.     Mouth/Throat:     Mouth: Mucous membranes are moist.     Pharynx: Oropharynx is clear.  Eyes:     Pupils: Pupils are equal, round, and reactive to light.  Neck:     Musculoskeletal: Normal range of motion.  Cardiovascular:     Rate and Rhythm: Normal rate and regular rhythm.     Pulses: Normal pulses.     Heart sounds: Normal heart sounds. No murmur.  Pulmonary:     Effort: Pulmonary effort is normal. No respiratory distress.     Breath sounds: No decreased air movement. Wheezing (Expiratory wheeze) present. No decreased breath sounds or rales.     Comments: Scattered squeaks Skin:  General: Skin is warm and dry.     Capillary Refill: Capillary refill takes less than 2 seconds.  Neurological:     General: No focal deficit present.     Mental Status: She is alert and oriented to person, place, and time. Mental status is at baseline.     Gait: Gait normal.  Psychiatric:        Mood and Affect: Mood normal.        Behavior: Behavior normal.        Thought Content: Thought content normal.        Judgment: Judgment normal.      Lab Results:  CBC    Component Value Date/Time   WBC 8.9 02/10/2018 0430   RBC 4.23 02/10/2018 0430   HGB 11.1 (L) 02/10/2018 0430   HCT 35.3 (L) 02/10/2018 0430   PLT 357 02/10/2018 0430   MCV 83.5 02/10/2018 0430   MCH 26.2 02/10/2018 0430   MCHC 31.4 02/10/2018 0430   RDW 14.2 02/10/2018 0430   LYMPHSABS 3.8 11/01/2017 1622   MONOABS 0.9 11/01/2017 1622   EOSABS 0.7 11/01/2017 1622   BASOSABS 0.2 (H) 11/01/2017 1622    BMET    Component Value Date/Time   NA 140 02/10/2018 0430   NA 139 12/06/2017 1600   K 3.4 (L) 02/10/2018 0430   CL 105 02/10/2018 0430   CO2 27 02/10/2018 0430   GLUCOSE 93 02/10/2018 0430   BUN 10 02/10/2018 0430   BUN 11 12/06/2017 1600   CREATININE 0.85 02/10/2018 0430    CALCIUM 8.3 (L) 02/10/2018 0430   GFRNONAA >60 02/10/2018 0430   GFRAA >60 02/10/2018 0430    BNP No results found for: BNP  ProBNP    Component Value Date/Time   PROBNP 124.4 03/15/2014 1024      Assessment & Plan:   ILD (interstitial lung disease) ([facility]) Assessment: Known ILD, high-resolution CT chest NSIP versus chronic HP May/2019 lung biopsy favors NSIP August/2020 high-resolution CT chest stable from 2018, still favors NSIP Patient was maintained on long-term prednisone Patient completed a walk in office today with no complications and no oxygen desaturations on room air Patient is only having shortness of breath with physical exertion when she is outside in heat Patient has had evaluation by rheumatology 10 years ago for suspected rheumatoid arthritis and she reports that this was negative and told not to follow back up this was Dr. Estanislado Pandy Few scattered squeaks on exam today, with audible wheezing, no crackles  Plan: Restart prednisone, start 30 mg daily for 7 days, then 20 mg daily for 7 days, then maintain 10 mg daily next follow-up Follow-up with our office in 2 months Spirometry with DLCO in 6 months    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[place]) Assessment: February/2019 high-resolution CT chest that favors NSIP or mild chronic HP May/2019 lung biopsy favors NSIP Patient recently completed prednisone Tolerated walk in office today August/2020 high-resolution CT chest still favors NSIP, stable from 2018  Plan: Walk in office today Resume prednisone -30 mg daily for 7 days, then 20 mg daily for 7 days, then 10 mg daily till next follow-up Follow-up in 2 months Spirometry with DLCO in 6 months  Cough variant asthma vs UACS  Plan: Continue Symbicort 80    Return in about 2 months (around 07/11/2019), or if symptoms worsen or fail to improve, for Follow up with Dr. Purnell Shoemaker.   Lauraine Rinne, NP 05/11/2019   This appointment was 28 minutes  long  with over 50% of the time in direct face-to-face patient care, assessment, plan of care, and follow-up.

## 2019-07-30 ENCOUNTER — Telehealth: Payer: Self-pay | Admitting: Internal Medicine

## 2019-07-30 MED ORDER — PREDNISONE 10 MG PO TABS: 10.0000 mg | ORAL_TABLET | Freq: Every day | ORAL | 0 refills | Status: DC

## 2019-07-30 NOTE — Telephone Encounter (Signed)
Spoke with patient. She was asking for a refill on her prednisone. She is still taking 10mg  once a day. Reviewed her last OV with Aaron Edelman and he wanted to stay on the 10mg  dosage after completing her taper.   RX for a one month supply has been called into [facility] on [address].   She verbalized understanding. Nothing further needed at time of call.

## 2019-08-07 ENCOUNTER — Other Ambulatory Visit: Payer: Self-pay

## 2019-08-07 ENCOUNTER — Telehealth: Payer: Self-pay | Admitting: Internal Medicine

## 2019-08-07 ENCOUNTER — Encounter: Payer: Self-pay | Admitting: Internal Medicine

## 2019-08-07 ENCOUNTER — Ambulatory Visit (INDEPENDENT_AMBULATORY_CARE_PROVIDER_SITE_OTHER): Payer: Medicare Other | Admitting: Internal Medicine

## 2019-08-07 VITALS — BP 120/76 | HR 68 | Ht 62.0 in | Wt 165.8 lb

## 2019-08-07 DIAGNOSIS — J849 Interstitial pulmonary disease, unspecified: Secondary | ICD-10-CM | POA: Diagnosis not present

## 2019-08-07 DIAGNOSIS — D171 Benign lipomatous neoplasm of skin and subcutaneous tissue of trunk: Secondary | ICD-10-CM | POA: Diagnosis not present

## 2019-08-07 DIAGNOSIS — Z23 Encounter for immunization: Secondary | ICD-10-CM

## 2019-08-07 DIAGNOSIS — I2510 Atherosclerotic heart disease of native coronary artery without angina pectoris: Secondary | ICD-10-CM | POA: Diagnosis not present

## 2019-08-07 DIAGNOSIS — R0602 Shortness of breath: Secondary | ICD-10-CM

## 2019-08-07 NOTE — Progress Notes (Signed)
OV 11/01/2017 - ILD clinic   - transfer of care and 2nd opinion  Chief Complaint  Patient presents with   Advice Only    Referred by Dr. Melford Aase due to an abnormal CT.  Pt does have complaints of a dry cough and SOB with exertion or if has a coughing episode.    68 year old female referred by the primary care physician to the ILD clinic for evaluation of her pulmonary problems with symptoms of cough and shortness of breath.  According to the patient for a better part of 2 years she has had episodic cough particularly in the fall season.  She says she is active in the outdoors doing gardening and cutting and blowing and mowing and she usually wears a mask but nevertheless she will get a bronchitis episode that will require nebulizers and prednisone to resolve.  Symptoms will usually resolve over 2-3 weeks.  However this time around it is taken 8 weeks to resolve.  Last prednisone was over a week or 2 ago.  She still has some ongoing wheezing.  In the background of this episodic cough she says between episodes she does not have any cough but she does have some baseline shortness of breath when she climbs a flight of stairs this is been stable and is of insidious onset also present for 2 years.  She did have a CT scan of the chest approximately 9 months ago in May 2018 that shows in my personal opinion based upon my personal visualization bilateral subpleural reticulation particularly in the upper lobes and some subpleural reticulation in the left lower lobe.  There is no clear distinct craniocaudal gradient in my opinion this would be indeterminate for UIP without a clear alternate etiology.  Given the presence of ILD findings she has been sent to the ILD clinic.  Review of the chart also shows she is seen.my colleague Dr. Melvyn Novas for asthma symptoms and is been placed on Symbicort which she takes 1 puff twice daily.  The exam nitric oxide a week after prednisone and while on Symbicort is elevated to  near abnormal levels at 48 ppb today  [employer] of chest physicians interstitial lung disease questionnaire -Past medical history: Positive for allergies.  IgE was elevated to 168 approximately a year or 2 ago on chart review.  In addition she gives a history of rheumatoid arthritis diagnosed many many years ago.  Seen by Dr. Keturah Barre at that time.  She is only followed up with nonsteroidal anti-inflammatory drugs.  She has not followed up with Dr. Keturah Barre in many years.  She feels she needs to go back.  However in May 2017 her autoimmune limited profile done here was negative.  -Personal exposure history: She has smoked marijuana in the past.  She smokes cigarettes from age 25 to age 10 a pack a day and quit.  -Family history of lung disease: Sister Sharon [name] who lives in [place] apparently has had a lung biopsy and has been diagnosed with sarcoidosis recently.  She is also a smoker.  Father died of congestive heart failure.  There is no diagnosis of pulmonary fibrosis or hypersensitivity pneumonitis  -Home exposure history: Has not lived in a house that is old in the last 10 years.  There is no humidifier or insomnia or hot tub or Jacuzzi or water damage or mold.  She has 1 dog.  She does not have any birds.  She does not use any feathered pillows.  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 Range 02/03/2016 11:20  Anit Nuclear Antibody(ANA) Latest Ref Range: NEGATIVE  NEG  Cyclic Citrullin Peptide Ab Latest Units: Units <16  RA Latex Turbid. Latest Ref  Range: <=14 IU/mL <10    Walking desaturation test on 11/01/2017 185 feet x 3 laps on ROOM AIR:  did not desaturate. Rest pulse ox was 100%, final pulse ox was 97%. HR response 88/min at rest to 100/min at peak exertion. Patient Sharon [name]  Did not Desaturate < 88% . Sharon [name] yes did  Desaturated </= 3% points. Sharon [name] yes did get tachyardic   FeNO - 48ppb and almost abnormal   OV 12/13/2017  Chief Complaint  Patient presents with   Follow-up    PFT done today. Pt states after last visit on 11/01/17, she developed bronchitis and had it x2 weeks. Pt states she still has a mild cough. Denies any SOB or CP.   Follow-up interstitial lung disease with asthma/allergy phenotype  She returns for follow-up after investigations.  [name] who is well-known to me through working to medical ICU where she is a Equities trader.  We did a history read taken patient tells me that she moved from [place] to [place] many years ago.  She does regular gardening and works in the yard [employer].  Many times the leads are damp and there is she suspects mold in it.  She also burns the lesion is exposed to the smoke.  Symbicort is helping but approximately 2 weeks ago had respiratory exacerbation that was not related to gardening [in fact she has not gotten this whole year] and ended up in the ER treated with antibiotics and prednisone.  Currently back to baseline.  She had pulmonary function test today that shows mixed obstruction and restriction associated with flow volume loop abnormalities.  She had a hypersensitivity pneumonitis panel documented below that is negative.  She had limited autoimmune panel [the CMA at last visit did not order the full panel] and this was normal.  She had repeat CT scan of the chest read by thoracic radiology I personally visualized this and agree with the findings.  It is indeterminate for UIP and there is no specific  alternate pattern.  No  air trapping is described.  The differential diagnosis appears to be broad.     Results for Sharon [name], Sharon [name] (MRN [id number]) as of 12/13/2017 09:57  Ref. Range 11/01/2017 16:22  Faenia retivirgula Latest Ref Range: NEGATIVE  NEGATIVE  S. VIRIDIS Latest Ref Range: NEGATIVE  NEGATIVE  T. CANDIDUS Latest Ref Range: NEGATIVE  NEGATIVE  T. VULGARIS Latest Ref Range: NEGATIVE  NEGATIVE    Results for Sharon [name], Sharon [name] (MRN [id number]) as of 12/13/2017 09:57  Ref. Range 11/01/2017 16:22  Anit Nuclear Antibody(ANA) Latest Ref Range: NEGATIVE  NEGATIVE  Angiotensin-Converting Enzyme Latest Ref Range: 9 - 67 U/L 25  Cyclic Citrullin Peptide Ab Latest Units: UNITS <16  ds DNA Ab Latest Units: IU/mL <1  RA Latex Turbid. Latest Ref Range: <  14 IU/mL <14  IgE (Immunoglobulin E), Serum Latest Ref Range: <OR=114 kU/L 252 (H)    IMPRESSION: CT FEb 2019 1. Pulmonary parenchymal pattern of fibrotic interstitial lung disease is unchanged from 02/07/2017 and may be due to nonspecific interstitial pneumonitis or mild chronic hypersensitivity pneumonitis. Findings are not consistent with usual interstitial pneumonitis. 2. Aortic atherosclerosis (ICD10-170.0). Coronary artery calcification.   Electronically Signed   By: Lorin Picket M.D.   On: 11/14/2017 12:56   OV 02/23/2018  Chief Complaint  Patient presents with   Follow-up    Pt had lung biopsy done and states she is doing good since then. States only time she has pain is when she sneezes. Denies any current complaints of cough, SOB, or CP.    Here to review SLB results -slides have been read by our resident pathologist with whom I discussed via email.  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  In addition he did see some eosinophilic infiltrates in the interstitium.  This can explain the high nitric oxide that she has.  She is here with her husband.  Since  surgery she is doing well.  She only has some pain in her chest when she sneezes at the postsurgical site.  She still has one suture hanging out which Dr. Roxan Hockey said was okay to remove and my LPN removed it.  But overall he she and her husband are here to discuss the pathology report.  They want to know prognosis and implications.  Her grandchild is now 50 days old and she wants to spend a lot of time taking care of the grandchild.  She is aware of prednisone side effects.  Her husband says that her mood might change because of prednisone but patient herself says she has tolerated prednisone just fine in the past.     OV 03/23/2018  Chief Complaint  Patient presents with   Follow-up    PFT performed 6/24.    Pt states she has been doing well since last visit. States only complaint is fatigue in the afternoons.    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 Remot hx of RA Rx with NSAID but 2019 - autoimmune panel negative. Started prednisone 02/23/18   Ms. Akamine is here to follow-up for her interstitial lung disease as above. She tells me that she is going to start 40 mg prednisone tomorrow. Meanwhile the prednisone has caused her to have 10 pound weight gain. Her clothes are barely fitting her. She is also having acidity and acid reflux for which she is not on treatment at this point. Otherwise overall she is tolerating things fine. There are no other new issues. She had pulmonary function test today and it shows 11% improvement in the postbronchodilator FVC. She feels her dyspnea has improved         OV 08/07/2019  Subjective:  Patient ID: Sharon [name], female , DOB: 08-05-1951 , age 78 y.o. 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 Remot hx of RA Rx with NSAID but 2019 - autoimmune panel negative. Started prednisone 02/23/18 08/07/2019 -   Chief  Complaint  Patient presents with   Follow-up    Pt states she has been doing well since last visit and denies any complaints with breathing.     HPI Sharon [name] 68 y.o. -last seen in May 2019.  She saw  our nurse practitioner in June 2020 and because she was doing well prednisone was tapered and stopped.  But this then resulted in worsening shortness of breath and chest tightness.  She visited the beach in the summer and she found it very difficult to even walk short distances.  This because of shortness of breath and relieved by rest.  Then in August 2020 she saw a nurse practitioner again and had pulmonary function test which is shown below.  It declined.  She went back on prednisone and is currently at 10 mg/day.  She feels back at baseline.  She says she does not have any organic antigen or mold exposure.  She has a Human resources officer.  There is no mold in the house.  No birds in the house.  Walking desaturation test today shows baseline normal performance.  However she tells me that when she climbs stairs she feels very short of breath.  She does have associated obesity. She wants to have flu and pneumovax   She is also complaining of chronic right infraaxillary right paralumbar region pain.  She says she has had previous multiple lipomas with excision.  She feels its tender to touch.  There is no urinary discomfort.  There is no fever or rash in the area.  This is a longstanding and intermittent.  There is no back pain.  After she left I noted that CT scan three-vessel coronary artery calcification.  I do not know if she has had a stress test.  SYMPTOM SCALE - ILD 08/07/2019   O2 use r  Shortness of Breath 0 -> 5 scale with 5 being worst (score 6 If unable to do)  At rest 0  Simple tasks - showers, clothes change, eating, shaving 0  Household (dishes, doing bed, laundry) 2  Shopping 1  Walking level at own pace 0  Walking keeping up with others of same age 61  Walking up Stairs 5   Walking up [place] 5  Total (40 - 48) Dyspnea Score 15  How bad is your cough? 0  How bad is your fatigue 0       Simple office walk 185 feet x  3 laps goal with forehead probe 02/23/2018  08/07/2019   O2 used Room air Room air  Number laps completed 3 3  Comments about pace slow Normal pace  Resting Pulse Ox/HR 100% and 71/min 100% and 68  Final Pulse Ox/HR 99% and 89/min 99% and 87/min  Desaturated </= 88% no no  Desaturated <= 3% points no no  Got Tachycardic >/= 90/min no no  Symptoms at end of test Mild dyspnea No complaints  Miscellaneous comments x      Results for Sharon [name], Sharon [name] (MRN [id number]) as of 08/07/2019 12:45  Ref. Range 12/13/2017 09:03 03/20/2018 09:11 05/18/2018 11:20 05/11/2019 13:59  FVC-Pre Latest Units: L 1.65 2.05 2.06 1.76  FVC-%Pred-Pre Latest Units: % 58 71 71 61   Results for NIXIE, JOKINEN (MRN [id number]) as of 08/07/2019 12:45  Ref. Range 12/13/2017 09:03 03/20/2018 09:11 05/18/2018 11:20 05/11/2019 13:59  DLCO unc Latest Units: ml/min/mmHg 13.89   15.51  DLCO unc % pred Latest Units: % 66   84   HRCT Aug 2020  IMPRESSION: 1. There is a redemonstrated pattern of apical predominant mild pulmonary fibrosis featuring mild traction bronchiectasis, subpleural irregular ground-glass and occasional areas of bronchiolectasis with some elements of peribronchovascular ground-glass and fibrotic architectural distortion, particular in the left upper lobe. There is evidence of  interval left lung wedge biopsies. No evidence of air trapping on expiratory phase imaging. Findings are not significantly changed compared to prior examinations dating back to 02/07/2017 and remain in an "alternate diagnosis" pattern by ATS pulmonary fibrosis criteria, consistent with histologic diagnosis favoring NSIP.  2.  Stable, benign small pulmonary nodules.  3.  Coronary artery disease and aortic atherosclerosis.   Electronically Signed   By: Eddie Candle M.D.    On: 05/01/2019 13:25   IMPRESSION: 1. There is a redemonstrated pattern of apical predominant mild pulmonary fibrosis featuring mild traction bronchiectasis, subpleural irregular ground-glass and occasional areas of bronchiolectasis with some elements of peribronchovascular ground-glass and fibrotic architectural distortion, particular in the left upper lobe. There is evidence of interval left lung wedge biopsies. No evidence of air trapping on expiratory phase imaging. Findings are not significantly changed compared to prior examinations dating back to 02/07/2017 and remain in an "alternate diagnosis" pattern by ATS pulmonary fibrosis criteria, consistent with histologic diagnosis favoring NSIP.  2.  Stable, benign small pulmonary nodules.  3.  Coronary artery disease and aortic atherosclerosis.   Electronically Signed   By: Eddie Candle M.D.   On: 05/01/2019 13:25  ROS - per HPI     has a past medical history of Anemia, Chronic major depressive disorder, DDD (degenerative disc disease), cervical, Depression, Dyspnea, Hypertension, Interstitial lung disease ([place]), Mixed hyperlipidemia, Pain in joints of both feet, Pneumonia (2016), Polyarthropathy, Stroke [facility]), Vision abnormalities, and Vitamin D deficiency.   reports that she quit smoking about 29 years ago. Her smoking use included cigarettes. She started smoking about 49 years ago. She has a 35.00 pack-year smoking history. She has never used smokeless tobacco.  Past Surgical History:  Procedure Laterality Date   CARPAL TUNNEL RELEASE     CESAREAN SECTION     x2   COLONOSCOPY     LUNG BIOPSY Left 02/08/2018   Procedure: LUNG BIOPSY;  Surgeon: Melrose Nakayama, MD;  Location: [facility];  Service: Thoracic;  Laterality: Left;   right arm tendon surgery     TAYLOR BUNIONECTOMY     2 on one foot and 1 on the other foot   VIDEO ASSISTED THORACOSCOPY Left 02/08/2018   Procedure: [place];   Surgeon: Melrose Nakayama, MD;  Location: [facility];  Service: Thoracic;  Laterality: Left;   VIDEO BRONCHOSCOPY N/A 02/08/2018   Procedure: VIDEO BRONCHOSCOPY;  Surgeon: Melrose Nakayama, MD;  Location: [facility];  Service: Thoracic;  Laterality: N/A;    Allergies  Allergen Reactions   Codeine Nausea Only    GI upset     Immunization History  Administered Date(s) Administered   Fluad Quad(high Dose 65+) 08/07/2019   Influenza, High Dose Seasonal PF 10/25/2017   Influenza-Unspecified 09/27/2013   Pneumococcal Conjugate-13 10/25/2017   Pneumococcal Polysaccharide-23 08/07/2019   Pneumococcal-Unspecified 08/07/2008   Zoster 12/06/2013    Family History  Problem Relation Age of Onset   Emphysema Father    Asthma Father    Congestive Heart Failure Father    Stroke Father    Asthma Sister    COPD Sister    Stroke Mother      Current Outpatient Medications:    albuterol (PROVENTIL HFA;VENTOLIN HFA) 108 (90 BASE) MCG/ACT inhaler, Inhale 2 puffs into the lungs every 4 (four) hours as needed for wheezing or shortness of breath., Disp: , Rfl:    ALPRAZolam (XANAX) 0.5 MG tablet, Take 0.5 mg by mouth daily as needed for anxiety. ,  Disp: , Rfl:    aspirin EC 81 MG tablet, Take 81 mg by mouth daily., Disp: , Rfl:    atorvastatin (LIPITOR) 40 MG tablet, Take 1 tablet (40 mg total) by mouth daily., Disp: 90 tablet, Rfl: 1   budesonide-formoterol (SYMBICORT) 80-4.5 MCG/ACT inhaler, Inhale 2 puffs into the lungs 2 (two) times daily., Disp: 1 Inhaler, Rfl: 0   budesonide-formoterol (SYMBICORT) 80-4.5 MCG/ACT inhaler, Take 2 puffs first thing in am and then another 2 puffs about 12 hours later., Disp: 1 Inhaler, Rfl: 6   calcium carbonate (TUMS EX) 750 MG chewable tablet, Chew 1 tablet by mouth daily as needed for heartburn., Disp: , Rfl:    clopidogrel (PLAVIX) 75 MG tablet, Take 1 tablet (75 mg total) by mouth daily., Disp: 21 tablet, Rfl: 0    dextromethorphan-guaiFENesin (MUCINEX DM) 30-600 MG per 12 hr tablet, Take 1 tablet by mouth 2 (two) times daily as needed (congestion). , Disp: , Rfl:    dorzolamide-timolol (COSOPT) 22.3-6.8 MG/ML ophthalmic solution, , Disp: , Rfl:    DULoxetine (CYMBALTA) 30 MG capsule, Take 60 mg by mouth once daily, Disp: , Rfl:    latanoprost (XALATAN) 0.005 % ophthalmic solution, Place 1 drop into both eyes at bedtime., Disp: , Rfl:    losartan (COZAAR) 50 MG tablet, Take 50 mg by mouth daily. , Disp: , Rfl:    predniSONE (DELTASONE) 10 MG tablet, Take 1 tablet (10 mg total) by mouth daily with breakfast., Disp: 30 tablet, Rfl: 0   traMADol (ULTRAM) 50 MG tablet, Take by mouth., Disp: , Rfl:       Objective:   Vitals:   08/07/19 1232  BP: 120/76  Pulse: 68  SpO2: 100%  Weight: 165 lb 12.8 oz (75.2 kg)  Height: 5\' 2"  (1.575 m)    Estimated body mass index is 30.33 kg/m as calculated from the following:   Height as of this encounter: 5\' 2"  (1.575 m).   Weight as of this encounter: 165 lb 12.8 oz (75.2 kg).  @WEIGHTCHANGE @  [employer]   08/07/19 1232  Weight: 165 lb 12.8 oz (75.2 kg)     Physical Exam  General Appearance:    Alert, cooperative, no distress, appears stated age - yes , Deconditioned looking - no , OBESE  - yes, Sitting on Wheelchair -  ye  Head:    Normocephalic, without obvious abnormality, atraumatic  Eyes:    PERRL, conjunctiva/corneas clear,  Ears:    Normal TM's and external ear canals, both ears  Nose:   Nares normal, septum midline, mucosa normal, no drainage    or sinus tenderness. OXYGEN ON  - no . Patient is @ ra   Throat:   Lips, mucosa, and tongue normal; teeth and gums normal. Cyanosis on lips - no  Neck:   Supple, symmetrical, trachea midline, no adenopathy;    thyroid:  no enlargement/tenderness/nodules; no carotid   bruit or JVD  Back:     Symmetric, no curvature, ROM normal, no CVA tenderness  Lungs:     Distress - no , Wheeze no, Barrell  Chest - no, Purse lip breathing - no, Crackles - yes faint scattered maybe   Chest Wall:    No tenderness or deformity.    Heart:    Regular rate and rhythm, S1 and S2 normal, no rub   or gallop, Murmur - no  Breast Exam:    NOT DONE  Abdomen:     Soft, non-tender, bowel sounds active all  four quadrants,    no masses, no organomegaly. Visceral obesity - YES. LIPOMA FELT in RT side  Genitalia:   NOT DONE  Rectal:   NOT DONE  Extremities:   Extremities - normal, Has Cane - no, Clubbing - no, Edema - no  Pulses:   2+ and symmetric all extremities  Skin:   Stigmata of Connective Tissue Disease - no  Lymph nodes:   Cervical, supraclavicular, and axillary nodes normal  Psychiatric:  Neurologic:   Pleasant - yes, Anxious - no, Flat affect - no  CAm-ICU - neg, Alert and Oriented x 3 - yes, Moves all 4s - yes, Speech - normal, Cognition - intact           Assessment:       ICD-10-CM   1. ILD (interstitial lung disease) (HCC)  J84.9 Pulmonary function test  2. Lipoma of torso  D17.1   3. Need for prophylactic vaccination against Streptococcus pneumoniae (pneumococcus)  Z23 Pneumococcal polysaccharide vaccine 23-valent greater than or equal to 2yo subcutaneous/IM    CANCELED: Pneumococcal polysaccharide vaccine 23-valent greater than or equal to 2yo subcutaneous/IM  4. Need for immunization against influenza  Z23 Flu Vaccine QUAD High Dose(Fluad)  5. Coronary artery calcification seen on CAT scan  I25.10        Plan:     Patient Instructions     ICD-10-CM   1. ILD (interstitial lung disease) (HCC)  J84.9 Pulmonary function test  2. Lipoma of torso  D17.1   3. Need for prophylactic vaccination against Streptococcus pneumoniae (pneumococcus)  Z23 Pneumococcal polysaccharide vaccine 23-valent greater than or equal to 2yo subcutaneous/IM    CANCELED: Pneumococcal polysaccharide vaccine 23-valent greater than or equal to 2yo subcutaneous/IM  4. Need for immunization against influenza  Z23  Flu Vaccine QUAD High Dose(Fluad)  5. Coronary artery calcification seen on CAT scan  I25.10     Interstitial lung disease  Interstitial lung disease got worse in summer 2020 without prednisone but glad you are better now with the prednisone  Plan -Continue prednisone at 10 mg/day -I will review your CT scan and previous lung biopsy at our conference to see if we need to make any revisions with the specific interstitial lung disease variety -Do spirometry and DLCO in 2-3 months -If there is evidence of continued progression in your lung function we can discuss an antifibrotic such as nintedanib as an add-on therapy to prednisone -High-dose flu shot today -Repeat Pneumovax vaccine today  Lipoma of the body trunk on the right side -This might be causing pain in the right flank area -Please talk to your primary care physician about this  Coronary artery calcification  - will call and find out about stress test  Follow-up -In 2-3 months to spirometry and DLCO -Return to ILD clinic for 30-minute slot; symptom score and simple walk test at follow-up  > 50% of this > 40 min visit spent in face to face counseling or/and coordination of care - by this undersigned MD - Dr Brand Males. This includes one or more of the following documented above: discussion of test results, diagnostic or treatment recommendations, prognosis, risks and benefits of management options, instructions, education, compliance or risk-factor reduction    SIGNATURE    Dr. Brand Males, M.D., F.C.C.P,  Pulmonary and Critical Care Medicine Staff Physician, [place] Director - Interstitial Lung Disease  Program  [facility] [place], [place], [address]  Pager: [phone]  [phone], If no answer or between  15:00h - 7:00h: call [phone] Telephone: [phone]  3:16 PM 08/07/2019

## 2019-08-07 NOTE — Patient Instructions (Addendum)
ICD-10-CM   1. ILD (interstitial lung disease) (HCC)  J84.9 Pulmonary function test  2. Lipoma of torso  D17.1   3. Need for prophylactic vaccination against Streptococcus pneumoniae (pneumococcus)  Z23 Pneumococcal polysaccharide vaccine 23-valent greater than or equal to 67yo subcutaneous/IM    CANCELED: Pneumococcal polysaccharide vaccine 23-valent greater than or equal to 67yo subcutaneous/IM  4. Need for immunization against influenza  Z23 Flu Vaccine QUAD High Dose(Fluad)  5. Coronary artery calcification seen on CAT scan  I25.10     Interstitial lung disease  Interstitial lung disease got worse in summer 2020 without prednisone but glad you are better now with the prednisone  Plan -Continue prednisone at 10 mg/day -I will review your CT scan and previous lung biopsy at our conference to see if we need to make any revisions with the specific interstitial lung disease variety -Do spirometry and DLCO in 2-3 months -If there is evidence of continued progression in your lung function we can discuss an antifibrotic such as nintedanib as an add-on therapy to prednisone -High-dose flu shot today -Repeat Pneumovax vaccine today  Lipoma of the body trunk on the right side -This might be causing pain in the right flank area -Please talk to your primary care physician about this  Coronary artery calcification  - will call and find out about stress test  Follow-up -In 2-3 months to spirometry and DLCO -Return to ILD clinic for 30-minute slot; symptom score and simple walk test at follow-up

## 2019-08-07 NOTE — Telephone Encounter (Signed)
Raquel Sarna please call the patient Sharon [name] and find out if she has had a cardiac stress test at any point in time.  After she left I noticed that she had coronary artery calcification described on the CT chest.  She was telling me that she has a lot more shortness of breath climbing stairs then on level ground.  Maybe she needs to get a cardiac stress test done if she has not had one in the last few to several years.

## 2019-08-10 NOTE — Telephone Encounter (Signed)
Called and spoke with pt letting her know the info stated by MR. Asked pt if she has ever had a stress test and pt said that she does not recall ever having one done. Stated to pt that MR said she might need one to see if this showed any reason for her worsening SOB and also due to coronary artery calcification seen on the CT. Pt verbalized understanding.  MR, please advise on this. Thanks!

## 2019-08-13 NOTE — Telephone Encounter (Signed)
Ok in this case please refer to cardiology

## 2019-08-13 NOTE — Telephone Encounter (Signed)
Order placed for referral to cardiology. Nothing further needed.

## 2019-08-14 ENCOUNTER — Ambulatory Visit (INDEPENDENT_AMBULATORY_CARE_PROVIDER_SITE_OTHER): Payer: Medicare Other | Admitting: Cardiovascular Disease

## 2019-08-14 ENCOUNTER — Other Ambulatory Visit: Payer: Self-pay

## 2019-08-14 ENCOUNTER — Telehealth (HOSPITAL_COMMUNITY): Payer: Self-pay

## 2019-08-14 ENCOUNTER — Encounter: Payer: Self-pay | Admitting: Cardiovascular Disease

## 2019-08-14 VITALS — BP 145/86 | HR 71 | Temp 97.3°F | Ht 62.0 in | Wt 165.0 lb

## 2019-08-14 DIAGNOSIS — I2510 Atherosclerotic heart disease of native coronary artery without angina pectoris: Secondary | ICD-10-CM | POA: Diagnosis not present

## 2019-08-14 DIAGNOSIS — R0602 Shortness of breath: Secondary | ICD-10-CM

## 2019-08-14 DIAGNOSIS — R072 Precordial pain: Secondary | ICD-10-CM | POA: Diagnosis not present

## 2019-08-14 DIAGNOSIS — E782 Mixed hyperlipidemia: Secondary | ICD-10-CM

## 2019-08-14 DIAGNOSIS — I10 Essential (primary) hypertension: Secondary | ICD-10-CM

## 2019-08-14 NOTE — Progress Notes (Signed)
Cardiology Office Note:   Date:  08/14/2019  NAME:  Sharon [name]    MRN: [id number] DOB:  [birth year]   PCP:  Chesley Noon, MD  Cardiologist:  No primary care provider on file.  Electrophysiologist:  None   Referring MD: Brand Males, MD   Chief Complaint  Patient presents with  . Coronary Artery Disease    History of Present Illness:   Sharon [name] is a 68 y.o. female with a hx of nonspecific interstitial lung disease on steroid therapy, hyperlipidemia, hypertension, TIA who is being seen today for the evaluation of coronary calcium seen on CT chest at the request of Chesley Noon, MD.  She is recently been diagnosed with interstitial lung disease about 1.5 years ago.  On a recent CT chest she was noted to have pretty extensive coronary artery calcifications as well as aortic calcifications.  She reports she gets short of breath with activity, and this is worse with heavy exertion.  She reports climbing steps are out of the question.  Symptoms have appeared to have gotten worse over the past 6 months.  She has been recently uptitrated on steroids and her lung doctor still working on this.  She has no prior history of myocardial infarction she does have a history of TIA.  She remains on aspirin and Plavix for the TIA.  There was never an arrhythmia found.  An echocardiogram last year was unremarkable and demonstrated normal left ventricular ejection fraction and no significant valvular heart disease.  Review of her EKG is quite normal today without any evidence of prior infarction or ischemic changes.  She does report at times she can get an atypical sharp pain in her left side.  She reports the pain can come anytime without any identifiable triggers.  She reports it resolves within 30 seconds.  It does not occur with exertion and is not alleviated by rest or decreased mental stress.  Review of laboratory data demonstrates her most recent LDL cholesterol was 116 and this was  checked in May of this year.  She is due for recheck.  She is not diabetic and thyroid studies were normal.  Past Medical History: Past Medical History:  Diagnosis Date  . Anemia    'years ago"  . Chronic major depressive disorder   . DDD (degenerative disc disease), cervical   . Depression   . Dyspnea   . Hypertension   . Interstitial lung disease ([place])   . Mixed hyperlipidemia   . Pain in joints of both feet   . Pneumonia 2016  . Polyarthropathy   . Stroke [facility])    TIA 11/11/17  . Vision abnormalities    catracts left eye  . Vitamin D deficiency     Past Surgical History: Past Surgical History:  Procedure Laterality Date  . CARPAL TUNNEL RELEASE    . CESAREAN SECTION     x2  . COLONOSCOPY    . LUNG BIOPSY Left 02/08/2018   Procedure: LUNG BIOPSY;  Surgeon: Melrose Nakayama, MD;  Location: [facility];  Service: Thoracic;  Laterality: Left;  . right arm tendon surgery    . TAYLOR BUNIONECTOMY     2 on one foot and 1 on the other foot  . VIDEO ASSISTED THORACOSCOPY Left 02/08/2018   Procedure: VIDEO ASSISTED THORACOSCOPY;  Surgeon: Melrose Nakayama, MD;  Location: [facility];  Service: Thoracic;  Laterality: Left;  Marland Kitchen VIDEO BRONCHOSCOPY N/A 02/08/2018   Procedure: VIDEO BRONCHOSCOPY;  Surgeon:  Melrose Nakayama, MD;  Location: [facility] OR;  Service: Thoracic;  Laterality: N/A;    Current Medications: Current Meds  Medication Sig  . albuterol (PROVENTIL HFA;VENTOLIN HFA) 108 (90 BASE) MCG/ACT inhaler Inhale 2 puffs into the lungs every 4 (four) hours as needed for wheezing or shortness of breath.  . ALPRAZolam (XANAX) 0.5 MG tablet Take 0.5 mg by mouth daily as needed for anxiety.   Marland Kitchen aspirin EC 81 MG tablet Take 81 mg by mouth daily.  Marland Kitchen atorvastatin (LIPITOR) 40 MG tablet Take 1 tablet (40 mg total) by mouth daily.  . budesonide-formoterol (SYMBICORT) 80-4.5 MCG/ACT inhaler Inhale 2 puffs into the lungs 2 (two) times daily.  . budesonide-formoterol (SYMBICORT) 80-4.5 MCG/ACT  inhaler Take 2 puffs first thing in am and then another 2 puffs about 12 hours later.  . calcium carbonate (TUMS EX) 750 MG chewable tablet Chew 1 tablet by mouth daily as needed for heartburn.  . clopidogrel (PLAVIX) 75 MG tablet Take 1 tablet (75 mg total) by mouth daily.  Marland Kitchen dextromethorphan-guaiFENesin (MUCINEX DM) 30-600 MG per 12 hr tablet Take 1 tablet by mouth 2 (two) times daily as needed (congestion).   . dorzolamide-timolol (COSOPT) 22.3-6.8 MG/ML ophthalmic solution   . DULoxetine (CYMBALTA) 30 MG capsule Take 60 mg by mouth once daily  . latanoprost (XALATAN) 0.005 % ophthalmic solution Place 1 drop into both eyes at bedtime.  Marland Kitchen losartan (COZAAR) 50 MG tablet Take 50 mg by mouth daily.   . predniSONE (DELTASONE) 10 MG tablet Take 1 tablet (10 mg total) by mouth daily with breakfast.  . traMADol (ULTRAM) 50 MG tablet Take by mouth.     Allergies:    Codeine   Social History: Social History   Socioeconomic History  . Marital status: Married    Spouse name: Not on file  . Number of children: Not on file  . Years of education: Not on file  . Highest education level: Not on file  Occupational History  . Occupation: retired  Scientific laboratory technician  . Financial resource strain: Not on file  . Food insecurity    Worry: Not on file    Inability: Not on file  . Transportation needs    Medical: Not on file    Non-medical: Not on file  Tobacco Use  . Smoking status: Former Smoker    Packs/day: 1.75    Years: 20.00    Pack years: 35.00    Types: Cigarettes    Start date: 61    Quit date: 1991    Years since quitting: 29.8  . Smokeless tobacco: Never Used  Substance and Sexual Activity  . Alcohol use: Yes    Alcohol/week: 1.0 standard drinks    Types: 1 Standard drinks or equivalent per week    Comment: once a month  . Drug use: Not Currently    Frequency: 7.0 times per week    Comment: 10 yrs. to current  . Sexual activity: Not on file  Lifestyle  . Physical activity     Days per week: Not on file    Minutes per session: Not on file  . Stress: Not on file  Relationships  . Social Herbalist on phone: Not on file    Gets together: Not on file    Attends religious service: Not on file    Active member of club or organization: Not on file    Attends meetings of clubs or organizations: Not on file  Relationship status: Not on file  Other Topics Concern  . Not on file  Social History Narrative   Lives with husband.   Homemaker.   2 children     Family History: The patient's family history includes Alzheimer's disease in her mother; Asthma in her father and sister; COPD in her sister; Congestive Heart Failure in her father; Emphysema in her father; Stroke in her father.  ROS:   All other ROS reviewed and negative. Pertinent positives noted in the HPI.     EKGs/Labs/Other Studies Reviewed:   The following studies were personally reviewed by me today:  EKG:  EKG is ordered today.  The ekg ordered today demonstrates normal sinus rhythm, heart rate 71, no evidence of prior infarction, no acute ischemic changes, and was personally reviewed by me.   Recent Labs: No results found for requested labs within last 8760 hours.   Recent Lipid Panel No results found for: CHOL, TRIG, HDL, CHOLHDL, VLDL, LDLCALC, LDLDIRECT  Physical Exam:   VS:  BP (!) 145/86   Pulse 71   Temp (!) 97.3 F (36.3 C)   Ht 5\' 2"  (1.575 m)   Wt 165 lb (74.8 kg)   SpO2 96%   BMI 30.18 kg/m    Wt Readings from Last 3 Encounters:  08/14/19 165 lb (74.8 kg)  08/07/19 165 lb 12.8 oz (75.2 kg)  05/11/19 163 lb (73.9 kg)    General: Well nourished, well developed, in no acute distress Heart: Atraumatic, normal size  Eyes: PEERLA, EOMI  Neck: Supple, no JVD Endocrine: No thryomegaly Cardiac: Normal S1, S2; RRR; no murmurs, rubs, or gallops Lungs: Clear to auscultation bilaterally, no wheezing, rhonchi or rales  Abd: Soft, nontender, no hepatomegaly  Ext: No edema,  pulses 2+ Musculoskeletal: No deformities, BUE and BLE strength normal and equal Skin: Warm and dry, no rashes   Neuro: Alert and oriented to person, place, time, and situation, CNII-XII grossly intact, no focal deficits  Psych: Normal mood and affect   ASSESSMENT:   Sharon [name] is a 68 y.o. female who presents for the following: 1. Coronary artery disease involving native coronary artery of native heart without angina pectoris   2. SOB (shortness of breath) on exertion   3. Precordial pain   4. Essential hypertension   5. Mixed hyperlipidemia     PLAN:   1. Coronary artery disease involving native coronary artery of native heart without angina pectoris 2. SOB (shortness of breath) on exertion 3. Precordial pain -She had fairly extensive coronary artery calcification seen on a lung CT.  This was not a coronary calcium score we do not have a score for her today.  She has had worsening shortness of breath for the past few months and given the atypical chest pain I think it is prudent to proceed with a nuclear medicine stress test.  Her most recent echocardiogram showed normal LV function and no significant valvular heart disease.  Her most recent cholesterol panel shows an LDL around 116 has not been rechecked.  We will continue her aspirin and statin for now.  We will plan to recheck a lipid profile at the time of her stress test.  We will see her back in 3 months after testing and be in touch with her by phone regarding results.  4. Essential hypertension -A bit elevated today we will continue current medications.  5. Mixed hyperlipidemia -We will recheck a lipid profile at the time of her stress test.  Disposition: Return in about 3 months (around 11/14/2019).  Medication Adjustments/Labs and Tests Ordered: Current medicines are reviewed at length with the patient today.  Concerns regarding medicines are outlined above.  Orders Placed This Encounter  Procedures  . Lipid panel   . Myocardial Perfusion Imaging   No orders of the defined types were placed in this encounter.   Patient Instructions  Medication Instructions:  The current medical regimen is effective;  continue present plan and medications.  *If you need a refill on your cardiac medications before your next appointment, please call your pharmacy*  Lab Work: LIPID when you come for LEXI scan. Please come fasting (nothing to eat or drink after midnight) If you have labs (blood work) drawn today and your tests are completely normal, you will receive your results only by: Marland Kitchen MyChart Message (if you have MyChart) OR . A paper copy in the mail If you have any lab test that is abnormal or we need to change your treatment, we will call you to review the results.  Testing/Procedures: Your physician has requested that you have a lexiscan myoview. A cardiac stress test is a cardiological test that measures the heart's ability to respond to external stress in a controlled clinical environment. The stress response is induced by intravenous pharmacological stimulation.   Follow-Up: At [facility], you and your health needs are our priority.  As part of our continuing mission to provide you with exceptional heart care, we have created designated Provider Care Teams.  These Care Teams include your primary Cardiologist (physician) and Advanced Practice Providers (APPs -  Physician Assistants and Nurse Practitioners) who all work together to provide you with the care you need, when you need it.  Your next appointment:   3 months  The format for your next appointment:   In Person  Provider:   You may see Dr.O'Neal or one of the following Advanced Practice Providers on your designated Care Team:    Almyra Deforest, PA-C  Fabian Sharp, PA-C or   Roby Lofts, PA-C       Signed, Addison Naegeli. Audie Box, [place]  [address], [place], [place] [address] [phone]  08/14/2019  11:02 AM

## 2019-08-14 NOTE — Telephone Encounter (Signed)
Encounter complete. 

## 2019-08-14 NOTE — Patient Instructions (Signed)
Medication Instructions:  The current medical regimen is effective;  continue present plan and medications.  *If you need a refill on your cardiac medications before your next appointment, please call your pharmacy*  Lab Work: LIPID when you come for LEXI scan. Please come fasting (nothing to eat or drink after midnight) If you have labs (blood work) drawn today and your tests are completely normal, you will receive your results only by: Marland Kitchen MyChart Message (if you have MyChart) OR . A paper copy in the mail If you have any lab test that is abnormal or we need to change your treatment, we will call you to review the results.  Testing/Procedures: Your physician has requested that you have a lexiscan myoview. A cardiac stress test is a cardiological test that measures the heart's ability to respond to external stress in a controlled clinical environment. The stress response is induced by intravenous pharmacological stimulation.   Follow-Up: At [facility], you and your health needs are our priority.  As part of our continuing mission to provide you with exceptional heart care, we have created designated Provider Care Teams.  These Care Teams include your primary Cardiologist (physician) and Advanced Practice Providers (APPs -  Physician Assistants and Nurse Practitioners) who all work together to provide you with the care you need, when you need it.  Your next appointment:   3 months  The format for your next appointment:   In Person  Provider:   You may see Dr.O'Neal or one of the following Advanced Practice Providers on your designated Care Team:    Almyra Deforest, PA-C  Fabian Sharp, PA-C or   Roby Lofts, [place]

## 2019-08-16 ENCOUNTER — Other Ambulatory Visit (INDEPENDENT_AMBULATORY_CARE_PROVIDER_SITE_OTHER): Payer: Medicare Other

## 2019-08-16 DIAGNOSIS — R072 Precordial pain: Secondary | ICD-10-CM

## 2019-08-16 DIAGNOSIS — R0602 Shortness of breath: Secondary | ICD-10-CM

## 2019-08-16 DIAGNOSIS — I10 Essential (primary) hypertension: Secondary | ICD-10-CM

## 2019-08-16 DIAGNOSIS — I2510 Atherosclerotic heart disease of native coronary artery without angina pectoris: Secondary | ICD-10-CM

## 2019-08-17 ENCOUNTER — Other Ambulatory Visit: Payer: Self-pay

## 2019-08-17 ENCOUNTER — Ambulatory Visit (HOSPITAL_COMMUNITY)
Admission: RE | Admit: 2019-08-17 | Discharge: 2019-08-17 | Disposition: A | Discharge status: Home / Self Care | Payer: Medicare Other | Source: Ambulatory Visit | Attending: Cardiology | Admitting: Cardiology

## 2019-08-17 DIAGNOSIS — I10 Essential (primary) hypertension: Secondary | ICD-10-CM | POA: Diagnosis present

## 2019-08-17 DIAGNOSIS — I2510 Atherosclerotic heart disease of native coronary artery without angina pectoris: Secondary | ICD-10-CM | POA: Diagnosis present

## 2019-08-17 DIAGNOSIS — R0602 Shortness of breath: Secondary | ICD-10-CM | POA: Insufficient documentation

## 2019-08-17 DIAGNOSIS — R072 Precordial pain: Secondary | ICD-10-CM | POA: Diagnosis present

## 2019-08-17 DIAGNOSIS — E782 Mixed hyperlipidemia: Secondary | ICD-10-CM

## 2019-08-17 LAB — LIPID PANEL
Chol/HDL Ratio: 2.7 ratio (ref 0.0–4.4)
Cholesterol, Total: 186 mg/dL (ref 100–199)
HDL: 70 mg/dL (ref >39)
LDL Chol Calc (NIH): 94 mg/dL (ref 0–99)
Triglycerides: 125 mg/dL (ref 0–149)
VLDL Cholesterol Cal: 22 mg/dL (ref 5–40)

## 2019-08-17 LAB — MYOCARDIAL PERFUSION IMAGING
LV dias vol: 60 mL (ref 46–106)
LV sys vol: 16 mL
Peak HR: 108 {beats}/min
Rest HR: 61 {beats}/min
SDS: 0
SRS: 0
SSS: 0
TID: 1.31

## 2019-08-17 MED ORDER — REGADENOSON 0.4 MG/5ML IV SOLN
0.4000 mg | Freq: Once | INTRAVENOUS | Status: AC
  Administered 2019-08-17: 0.4 mg via INTRAVENOUS

## 2019-08-17 MED ORDER — TECHNETIUM TC 99M TETROFOSMIN IV KIT
10.5000 | PACK | Freq: Once | INTRAVENOUS | Status: AC | PRN
  Administered 2019-08-17: 10.5 via INTRAVENOUS
  Filled 2019-08-17: qty 11

## 2019-08-17 MED ORDER — TECHNETIUM TC 99M TETROFOSMIN IV KIT
32.4000 | PACK | Freq: Once | INTRAVENOUS | Status: AC | PRN
  Administered 2019-08-17: 32.4 via INTRAVENOUS
  Filled 2019-08-17: qty 33

## 2019-08-28 ENCOUNTER — Other Ambulatory Visit: Payer: Self-pay | Admitting: Internal Medicine

## 2019-09-04 ENCOUNTER — Ambulatory Visit (INDEPENDENT_AMBULATORY_CARE_PROVIDER_SITE_OTHER): Payer: Medicare Other | Admitting: Internal Medicine

## 2019-09-04 DIAGNOSIS — J849 Interstitial pulmonary disease, unspecified: Secondary | ICD-10-CM | POA: Diagnosis not present

## 2019-09-04 DIAGNOSIS — J8489 Other specified interstitial pulmonary diseases: Secondary | ICD-10-CM

## 2019-09-16 NOTE — Progress Notes (Signed)
Interstitial Lung Disease Multidisciplinary Conference   Sharon [name]    MRN [id number]    DOB 1951-05-14  Primary Care Physician:Badger, Rebeca Alert, MD  Referring Physician: Brand Males  Time of Conference: 7.30am- 8.30am Date of conference: 09/04/2019 Location of Conference: -  Virtual  Participating Pulmonary: Dr. Brand Males, MD - yes,  Dr Marshell Garfinkel, MD - yes Pathology: Dr Jaquita Folds, MD - yes , Dr Enid Cutter - no Radiology: Dr Salvatore Marvel MD - no, Dr Vinnie Langton MD - no,  Dr Lorin Picket, MD - no , Dr Eddie Candle - yes Others: Wyn Quaker , Tammy Parrett, [place],   Brief History: ILD s/p bx - responded to steroisd but off steroids declined  Serology: trace positive MPO in march 2019  MDD discussion of CT scan    - Date or time period of scan:  05/01/2019 - originally read by Dr Laqueta Carina. His interpretation at conference is the same  - Features mentioned:  Lungs/Pleura: There is a redemonstrated pattern of apical predominant mild pulmonary fibrosis featuring mild traction bronchiectasis, subpleural irregular ground-glass and occasional areas of bronchiolectasis with some elements of peribronchovascular ground-glass and fibrotic architectural distortion, particular in the left upper lobe. There is evidence of interval left lung wedge biopsies. No evidence of air trapping on expiratory phase imaging. Stable, benign small pulmonary nodules, for example a 4 mm nodule of the right upper lobe (series 7, image 38). No pleural effusion or pneumothorax.   - What is the final conclusion per 2018 ATS/Fleischner Criteria - ALTERNATE  Pathology discussion of biopsy 02/08/2018 - homogeneous patter of fibrosis with UL predominance. Some degree of inflammation. No subpleural accentuation. Mild to moderate interstitial thickening, Some nodular fibrosis with lymphocytic aggregates more c/w bronchiolitis.  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 Rx steroids and If progressive anti-fibrotic. Could consider repeat serology at followup if indicated   Time Spent in preparation and discussion:  > 30 min    SIGNATURE   Dr. Brand Males, M.D., F.C.C.P,  Pulmonary and Critical Care Medicine Staff Physician, [place] Director - Interstitial Lung Disease  Program  Pulmonary [place] [facility] [place], [place], [address]  Pager: [phone], If no answer or between  15:00h - 7:00h: call [phone] Telephone: [phone]  6:17 PM 09/16/2019 ...................................................................................................................Marland Kitchen References: Diagnosis of Hypersensitivity Pneumonitis in Adults. An Official ATS/JRS/ALAT Clinical Practice Guideline. Ragu G et al, [place] Aug 1;202(3):e36-e69.       Diagnosis of Idiopathic Pulmonary Fibrosis. An Official ATS/ERS/JRS/ALAT Clinical Practice Guideline. Raghu G et al, [place]. 2018 Sep 1;198(5):e44-e68.   IPF Suspected   Histopath ology Pattern      UIP  Probable UIP  Indeterminate for  UIP  Alternative  diagnosis    UIP  IPF  IPF  IPF  Non-IPF dx   HRCT   Probabe UIP  IPF  IPF  IPF (Likely)**  Non-IPF dx  Pattern  Indeterminate for UIP  IPF  IPF (Likely)**  Indeterminate  for IPF**  Non-IPF dx    Alternative diagnosis  IPF (Likely)**/ non-IPF dx  Non-IPF dx  Non-IPF dx  Non-IPF dx     Idiopathic pulmonary fibrosis diagnosis based upon HRCT  and Biopsy paterns.  ** IPF is the likely diagnosis when any of following features are present:  . Moderate-to-severe traction bronchiectasis/bronchiolectasis (defined as mild traction bronchiectasis/bronchiolectasis in four or more lobes including  the lingual as a lobe, or moderate to severe traction bronchiectasis in two or more lobes) in a man over age 67 years or in a woman over age 67 years . Extensive (>30%) reticulation on HRCT and an age >67 years  . Increased neutrophils and/or absence of lymphocytosis in BAL fluid  . Multidisciplinary discussion reaches a confident diagnosis of IPF.   **Indeterminate for IPF  . Without an adequate biopsy is unlikely to be IPF  . With an adequate biopsy may be reclassified to a more specific diagnosis after multidisciplinary discussion and/or additional consultation.   dx = diagnosis; HRCT = high-resolution computed tomography; IPF = idiopathic pulmonary fibrosis; UIP = usual interstitial pneumonia.

## 2019-11-11 NOTE — Progress Notes (Deleted)
Cardiology Office Note:   Date:  11/11/2019  NAME:  Sharon [name]    MRN: [id number] DOB:  1951-01-07   PCP:  Chesley Noon, MD  Cardiologist:  No primary care provider on file.  Electrophysiologist:  None   Referring MD: Chesley Noon, MD   No chief complaint on file. ***  History of Present Illness:   Sharon [name] is a 69 y.o. female with a hx of ILD, HLD, HTN, TIA, CAD who presents for follow-up of CAD. Extensive CAC seen on CT lung for ILD. Stress test normal.   Problem List 1. ILD 2. HTN 3. HLD -T chol 186 HDL 70 LDL 94 TG 125 4. TIA 5. CAD -extensive CAC sceen on lung CT  Past Medical History: Past Medical History:  Diagnosis Date  . Anemia    'years ago"  . Chronic major depressive disorder   . DDD (degenerative disc disease), cervical   . Depression   . Dyspnea   . Hypertension   . Interstitial lung disease ([place])   . Mixed hyperlipidemia   . Pain in joints of both feet   . Pneumonia 2016  . Polyarthropathy   . Stroke [facility])    TIA 11/11/17  . Vision abnormalities    catracts left eye  . Vitamin D deficiency     Past Surgical History: Past Surgical History:  Procedure Laterality Date  . CARPAL TUNNEL RELEASE    . CESAREAN SECTION     x2  . COLONOSCOPY    . LUNG BIOPSY Left 02/08/2018   Procedure: LUNG BIOPSY;  Surgeon: Melrose Nakayama, MD;  Location: [facility];  Service: Thoracic;  Laterality: Left;  . right arm tendon surgery    . TAYLOR BUNIONECTOMY     2 on one foot and 1 on the other foot  . VIDEO ASSISTED THORACOSCOPY Left 02/08/2018   Procedure: VIDEO ASSISTED THORACOSCOPY;  Surgeon: Melrose Nakayama, MD;  Location: [facility];  Service: Thoracic;  Laterality: Left;  Marland Kitchen VIDEO BRONCHOSCOPY N/A 02/08/2018   Procedure: VIDEO BRONCHOSCOPY;  Surgeon: Melrose Nakayama, MD;  Location: [facility] OR;  Service: Thoracic;  Laterality: N/A;    Current Medications: No outpatient medications have been marked as taking for the 11/14/19 encounter  (Appointment) with O'Neal, Cassie Freer, MD.     Allergies:    Codeine   Social History: Social History   Socioeconomic History  . Marital status: Married    Spouse name: Not on file  . Number of children: Not on file  . Years of education: Not on file  . Highest education level: Not on file  Occupational History  . Occupation: retired  Tobacco Use  . Smoking status: Former Smoker    Packs/day: 1.75    Years: 20.00    Pack years: 35.00    Types: Cigarettes    Start date: 41    Quit date: 1991    Years since quitting: 30.1  . Smokeless tobacco: Never Used  Substance and Sexual Activity  . Alcohol use: Yes    Alcohol/week: 1.0 standard drinks    Types: 1 Standard drinks or equivalent per week    Comment: once a month  . Drug use: Not Currently    Frequency: 7.0 times per week    Comment: 10 yrs. to current  . Sexual activity: Not on file  Other Topics Concern  . Not on file  Social History Narrative   Lives with husband.   Homemaker.  2 children   Social Determinants of Health   Financial Resource Strain:   . Difficulty of Paying Living Expenses: Not on file  Food Insecurity:   . Worried About Charity fundraiser in the Last Year: Not on file  . Ran Out of Food in the Last Year: Not on file  Transportation Needs:   . Lack of Transportation (Medical): Not on file  . Lack of Transportation (Non-Medical): Not on file  Physical Activity:   . Days of Exercise per Week: Not on file  . Minutes of Exercise per Session: Not on file  Stress:   . Feeling of Stress : Not on file  Social Connections:   . Frequency of Communication with Friends and Family: Not on file  . Frequency of Social Gatherings with Friends and Family: Not on file  . Attends Religious Services: Not on file  . Active Member of Clubs or Organizations: Not on file  . Attends Archivist Meetings: Not on file  . Marital Status: Not on file     Family History: The patient's ***family  history includes Alzheimer's disease in her mother; Asthma in her father and sister; COPD in her sister; Congestive Heart Failure in her father; Emphysema in her father; Stroke in her father.  ROS:   All other ROS reviewed and negative. Pertinent positives noted in the HPI.     EKGs/Labs/Other Studies Reviewed:   The following studies were personally reviewed by me today:  EKG:  EKG is *** ordered today.  The ekg ordered today demonstrates ***, and was personally reviewed by me.   NM Stress 08/17/2019  Nuclear stress EF: 73%.  The left ventricular ejection fraction is hyperdynamic (>65%).  There was no ST segment deviation noted during stress.  The study is normal.  This is a low risk study.  Recent Labs: No results found for requested labs within last 8760 hours.   Recent Lipid Panel    Component Value Date/Time   CHOL 186 08/17/2019 1021   TRIG 125 08/17/2019 1021   HDL 70 08/17/2019 1021   CHOLHDL 2.7 08/17/2019 1021   LDLCALC 94 08/17/2019 1021    Physical Exam:   VS:  There were no vitals taken for this visit.   Wt Readings from Last 3 Encounters:  08/17/19 165 lb (74.8 kg)  08/14/19 165 lb (74.8 kg)  08/07/19 165 lb 12.8 oz (75.2 kg)    General: Well nourished, well developed, in no acute distress Heart: Atraumatic, normal size  Eyes: PEERLA, EOMI  Neck: Supple, no JVD Endocrine: No thryomegaly Cardiac: Normal S1, S2; RRR; no murmurs, rubs, or gallops Lungs: Clear to auscultation bilaterally, no wheezing, rhonchi or rales  Abd: Soft, nontender, no hepatomegaly  Ext: No edema, pulses 2+ Musculoskeletal: No deformities, BUE and BLE strength normal and equal Skin: Warm and dry, no rashes   Neuro: Alert and oriented to person, place, time, and situation, CNII-XII grossly intact, no focal deficits  Psych: Normal mood and affect   ASSESSMENT:   Sharon [name] is a 69 y.o. female who presents for the following: No diagnosis found.  PLAN:   There are no  diagnoses linked to this encounter.  Disposition: No follow-ups on file.  Medication Adjustments/Labs and Tests Ordered: Current medicines are reviewed at length with the patient today.  Concerns regarding medicines are outlined above.  No orders of the defined types were placed in this encounter.  No orders of the defined types were placed in  this encounter.   There are no Patient Instructions on file for this visit.   Signed, Addison Naegeli. Audie Box, [place]  [address] [place], [place] [address] [phone]  11/11/2019 3:31 PM

## 2019-11-14 ENCOUNTER — Ambulatory Visit: Payer: Medicare Other | Admitting: Cardiovascular Disease

## 2019-11-15 ENCOUNTER — Telehealth: Payer: Self-pay | Admitting: Internal Medicine

## 2019-11-15 NOTE — Telephone Encounter (Signed)
Last seen Nov 2020 with instructions for 3 month spiro/dlco -> and to see me . Do not see this on appt  Plan  - April 2021 give spiro/dlco viist -> then see me 15 min slot - ILD day preferred but any day fine - ideally she needs spiro/dlco before seeing me because she has declined -> this is really imporatnat. If delays let me know -> I might get a free one for her in research

## 2019-11-20 NOTE — Telephone Encounter (Signed)
Called and spoke with pt. Scheduled pt for PFT 4/30 at 4pm. Stated to pt that we would get her scheduled for OV with MR once his schedule opened up for May and she verbalized understanding. Recall placed for pt's appt with MR in May. Nothing further needed.

## 2019-11-20 NOTE — Telephone Encounter (Signed)
pls chck with her how she is dpoing. If stable, then do spiro/dlco April 2021 and then see me in may 2021. We can open up May once schedule is made  Thanks  MR

## 2019-11-20 NOTE — Telephone Encounter (Signed)
First avail PFT is 4/30 at 4pm. After this one, we will be having to schedule in May for PFT.  MR does not have a schedule available in May. MR, please advise in regards to what you want [place] to do?

## 2019-12-17 ENCOUNTER — Encounter: Payer: Self-pay | Admitting: Cardiovascular Disease

## 2020-01-01 ENCOUNTER — Other Ambulatory Visit: Payer: Self-pay | Admitting: Internal Medicine

## 2020-01-22 ENCOUNTER — Other Ambulatory Visit (HOSPITAL_COMMUNITY)
Admission: RE | Admit: 2020-01-22 | Discharge: 2020-01-22 | Disposition: A | Discharge status: Home / Self Care | Payer: Medicare Other | Source: Ambulatory Visit | Attending: Internal Medicine | Admitting: Internal Medicine

## 2020-01-22 DIAGNOSIS — Z20822 Contact with and (suspected) exposure to covid-19: Secondary | ICD-10-CM | POA: Diagnosis not present

## 2020-01-22 DIAGNOSIS — Z01812 Encounter for preprocedural laboratory examination: Secondary | ICD-10-CM | POA: Insufficient documentation

## 2020-01-22 LAB — SARS CORONAVIRUS 2 (TAT 6-24 HRS): SARS Coronavirus 2: NEGATIVE

## 2020-01-25 ENCOUNTER — Other Ambulatory Visit: Payer: Self-pay

## 2020-01-25 ENCOUNTER — Ambulatory Visit (INDEPENDENT_AMBULATORY_CARE_PROVIDER_SITE_OTHER): Payer: Medicare Other | Admitting: Internal Medicine

## 2020-01-25 DIAGNOSIS — J8489 Other specified interstitial pulmonary diseases: Secondary | ICD-10-CM

## 2020-01-25 DIAGNOSIS — J849 Interstitial pulmonary disease, unspecified: Secondary | ICD-10-CM

## 2020-01-25 LAB — PULMONARY FUNCTION TEST
DL/VA % pred: 108 %
DL/VA: 4.55 ml/min/mmHg/L
DLCO cor % pred: 82 %
DLCO cor: 15.12 ml/min/mmHg
DLCO unc % pred: 82 %
DLCO unc: 15.12 ml/min/mmHg
FEF 25-75 Pre: 1.75 L/sec
FEF2575-%Pred-Pre: 93 %
FEV1-%Pred-Pre: 78 %
FEV1-Pre: 1.67 L
FEV1FVC-%Pred-Pre: 109 %
FEV6-%Pred-Pre: 74 %
FEV6-Pre: 2.01 L
FEV6FVC-%Pred-Pre: 104 %
FVC-%Pred-Pre: 71 %
FVC-Pre: 2.01 L
Pre FEV1/FVC ratio: 83 %
Pre FEV6/FVC Ratio: 100 %

## 2020-01-25 NOTE — Progress Notes (Signed)
Spirometry and Dlco done today. 

## 2020-02-14 ENCOUNTER — Other Ambulatory Visit: Payer: Self-pay

## 2020-02-14 ENCOUNTER — Ambulatory Visit (INDEPENDENT_AMBULATORY_CARE_PROVIDER_SITE_OTHER): Payer: Medicare Other | Admitting: Internal Medicine

## 2020-02-14 ENCOUNTER — Encounter: Payer: Self-pay | Admitting: Internal Medicine

## 2020-02-14 VITALS — BP 106/62 | HR 84 | Temp 97.4°F | Ht 62.0 in | Wt 167.2 lb

## 2020-02-14 DIAGNOSIS — J849 Interstitial pulmonary disease, unspecified: Secondary | ICD-10-CM

## 2020-02-14 DIAGNOSIS — J8489 Other specified interstitial pulmonary diseases: Secondary | ICD-10-CM | POA: Diagnosis not present

## 2020-02-14 NOTE — Patient Instructions (Addendum)
  Interstitial lung disease  Interstitial lung disease is stable.  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  Appears 10 mg prednisone is working well for you  Glad recent stress test was fine  Plan -Continue prednisone at 10 mg/day -Continue to wear a mask especially with gardening -Repeat spirometry and DLCO in 6 months  Follow-up -30-minute ILD slot in 6 months or sooner if needed

## 2020-02-14 NOTE — Progress Notes (Signed)
OV 11/01/2017 - ILD clinic   - transfer of care and 2nd opinion  Chief Complaint  Patient presents with  . Advice Only    Referred by Dr. Melford Aase due to an abnormal CT.  Pt does have complaints of a dry cough and SOB with exertion or if has a coughing episode.    69 year old female referred by the primary care physician to the ILD clinic for evaluation of her pulmonary problems with symptoms of cough and shortness of breath.  According to the patient for a better part of 2 years she has had episodic cough particularly in the fall season.  She says she is active in the outdoors doing gardening and cutting and blowing and mowing and she usually wears a mask but nevertheless she will get a bronchitis episode that will require nebulizers and prednisone to resolve.  Symptoms will usually resolve over 2-3 weeks.  However this time around it is taken 8 weeks to resolve.  Last prednisone was over a week or 2 ago.  She still has some ongoing wheezing.  In the background of this episodic cough she says between episodes she does not have any cough but she does have some baseline shortness of breath when she climbs a flight of stairs this is been stable and is of insidious onset also present for 2 years.  She did have a CT scan of the chest approximately 9 months ago in May 2018 that shows in my personal opinion based upon my personal visualization bilateral subpleural reticulation particularly in the upper lobes and some subpleural reticulation in the left lower lobe.  There is no clear distinct craniocaudal gradient in my opinion this would be indeterminate for UIP without a clear alternate etiology.  Given the presence of ILD findings she has been sent to the ILD clinic.  Review of the chart also shows she is seen.my colleague Dr. Melvyn Novas for asthma symptoms and is been placed on Symbicort which she takes 1 puff twice daily.  The exam nitric oxide a week after prednisone and while on Symbicort is elevated to  near abnormal levels at 48 ppb today  [employer] of chest physicians interstitial lung disease questionnaire -Past medical history: Positive for allergies.  IgE was elevated to 168 approximately a year or 2 ago on chart review.  In addition she gives a history of rheumatoid arthritis diagnosed many many years ago.  Seen by Dr. Keturah Barre at that time.  She is only followed up with nonsteroidal anti-inflammatory drugs.  She has not followed up with Dr. Keturah Barre in many years.  She feels she needs to go back.  However in May 2017 her autoimmune limited profile done here was negative.  -Personal exposure history: She has smoked marijuana in the past.  She smokes cigarettes from age 81 to age 17 a pack a day and quit.  -Family history of lung disease: Sister Sharon [name] who lives in [place] apparently has had a lung biopsy and has been diagnosed with sarcoidosis recently.  She is also a smoker.  Father died of congestive heart failure.  There is no diagnosis of pulmonary fibrosis or hypersensitivity pneumonitis  -Home exposure history: Has not lived in a house that is old in the last 10 years.  There is no humidifier or insomnia or hot tub or Jacuzzi or water damage or mold.  She has 1 dog.  She does not have any birds.  She does not use any feathered pillows.  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 Range 02/03/2016 11:20  Anit Nuclear Antibody(ANA) Latest Ref Range: NEGATIVE  NEG  Cyclic Citrullin Peptide Ab Latest Units: Units <16  RA Latex Turbid. Latest Ref  Range: <=14 IU/mL <10    Walking desaturation test on 11/01/2017 185 feet x 3 laps on ROOM AIR:  did not desaturate. Rest pulse ox was 100%, final pulse ox was 97%. HR response 88/min at rest to 100/min at peak exertion. Patient Sharon [name]  Did not Desaturate < 88% . Sharon [name] yes did  Desaturated </= 3% points. Sharon Bayley Goerke yes did get tachyardic   FeNO - 48ppb and almost abnormal   OV 12/13/2017  Chief Complaint  Patient presents with  . Follow-up    PFT done today. Pt states after last visit on 11/01/17, she developed bronchitis and had it x2 weeks. Pt states she still has a mild cough. Denies any SOB or CP.   Follow-up interstitial lung disease with asthma/allergy phenotype  She returns for follow-up after investigations.  [name] who is well-known to me through working to medical ICU where she is a Equities trader.  We did a history read taken patient tells me that she moved from [place] to [place] many years ago.  She does regular gardening and works in the yard [employer].  Many times the leads are damp and there is she suspects mold in it.  She also burns the lesion is exposed to the smoke.  Symbicort is helping but approximately 2 weeks ago had respiratory exacerbation that was not related to gardening [in fact she has not gotten this whole year] and ended up in the ER treated with antibiotics and prednisone.  Currently back to baseline.  She had pulmonary function test today that shows mixed obstruction and restriction associated with flow volume loop abnormalities.  She had a hypersensitivity pneumonitis panel documented below that is negative.  She had limited autoimmune panel [the CMA at last visit did not order the full panel] and this was normal.  She had repeat CT scan of the chest read by thoracic radiology I personally visualized this and agree with the findings.  It is indeterminate for UIP and there is no specific  alternate pattern.  No  air trapping is described.  The differential diagnosis appears to be broad.     Results for Sharon, [name] (MRN [id number]) as of 12/13/2017 09:57  Ref. Range 11/01/2017 16:22  Faenia retivirgula Latest Ref Range: NEGATIVE  NEGATIVE  S. VIRIDIS Latest Ref Range: NEGATIVE  NEGATIVE  T. CANDIDUS Latest Ref Range: NEGATIVE  NEGATIVE  T. VULGARIS Latest Ref Range: NEGATIVE  NEGATIVE    Results for Sharon, [name] (MRN [id number]) as of 12/13/2017 09:57  Ref. Range 11/01/2017 16:22  Anit Nuclear Antibody(ANA) Latest Ref Range: NEGATIVE  NEGATIVE  Angiotensin-Converting Enzyme Latest Ref Range: 9 - 67 U/L 25  Cyclic Citrullin Peptide Ab Latest Units: UNITS <16  ds DNA Ab Latest Units: IU/mL <1  RA Latex Turbid. Latest Ref Range: <  14 IU/mL <14  IgE (Immunoglobulin E), Serum Latest Ref Range: <OR=114 kU/L 252 (H)    IMPRESSION: CT FEb 2019 1. Pulmonary parenchymal pattern of fibrotic interstitial lung disease is unchanged from 02/07/2017 and may be due to nonspecific interstitial pneumonitis or mild chronic hypersensitivity pneumonitis. Findings are not consistent with usual interstitial pneumonitis. 2. Aortic atherosclerosis (ICD10-170.0). Coronary artery calcification.   Electronically Signed   By: Lorin Picket M.D.   On: 11/14/2017 12:56   OV 02/23/2018  Chief Complaint  Patient presents with  . Follow-up    Pt had lung biopsy done and states she is doing good since then. States only time she has pain is when she sneezes. Denies any current complaints of cough, SOB, or CP.    Here to review SLB results -slides have been read by our resident pathologist with whom I discussed via email.  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  In addition he did see some eosinophilic infiltrates in the interstitium.  This can explain the high nitric oxide that she has.  She is here with her husband.  Since  surgery she is doing well.  She only has some pain in her chest when she sneezes at the postsurgical site.  She still has one suture hanging out which Dr. Roxan Hockey said was okay to remove and my LPN removed it.  But overall he she and her husband are here to discuss the pathology report.  They want to know prognosis and implications.  Her grandchild is now 8 days old and she wants to spend a lot of time taking care of the grandchild.  She is aware of prednisone side effects.  Her husband says that her mood might change because of prednisone but patient herself says she has tolerated prednisone just fine in the past.     OV 03/23/2018  Chief Complaint  Patient presents with  . Follow-up    PFT performed 6/24.    Pt states she has been doing well since last visit. States only complaint is fatigue in the afternoons.    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 Remot hx of RA Rx with NSAID but 2019 - autoimmune panel negative. Started prednisone 02/23/18   Sharon [name] is here to follow-up for her interstitial lung disease as above. She tells me that she is going to start 40 mg prednisone tomorrow. Meanwhile the prednisone has caused her to have 10 pound weight gain. Her clothes are barely fitting her. She is also having acidity and acid reflux for which she is not on treatment at this point. Otherwise overall she is tolerating things fine. There are no other new issues. She had pulmonary function test today and it shows 11% improvement in the postbronchodilator FVC. She feels her dyspnea has improved         OV 08/07/2019  Subjective:  Patient ID: Sharon [name], female , DOB: 1951/04/24 , age 59 y.o. 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 Remot hx of RA Rx with NSAID but 2019 - autoimmune panel negative. Started prednisone 02/23/18 08/07/2019 -   Chief  Complaint  Patient presents with  . Follow-up    Pt states she has been doing well since last visit and denies any complaints with breathing.     HPI Sharon [name] 69 y.o. -last seen in May 2019.  She saw  our nurse practitioner in June 2020 and because she was doing well prednisone was tapered and stopped.  But this then resulted in worsening shortness of breath and chest tightness.  She visited the beach in the summer and she found it very difficult to even walk short distances.  This because of shortness of breath and relieved by rest.  Then in August 2020 she saw a nurse practitioner again and had pulmonary function test which is shown below.  It declined.  She went back on prednisone and is currently at 10 mg/day.  She feels back at baseline.  She says she does not have any organic antigen or mold exposure.  She has a Human resources officer.  There is no mold in the house.  No birds in the house.  Walking desaturation test today shows baseline normal performance.  However she tells me that when she climbs stairs she feels very short of breath.  She does have associated obesity. She wants to have flu and pneumovax   She is also complaining of chronic right infraaxillary right paralumbar region pain.  She says she has had previous multiple lipomas with excision.  She feels its tender to touch.  There is no urinary discomfort.  There is no fever or rash in the area.  This is a longstanding and intermittent.  There is no back pain.  After she left I noted that CT scan three-vessel coronary artery calcification.  I do not know if she has had a stress test.    HRCT Aug 2020  IMPRESSION: 1. There is a redemonstrated pattern of apical predominant mild pulmonary fibrosis featuring mild traction bronchiectasis, subpleural irregular ground-glass and occasional areas of bronchiolectasis with some elements of peribronchovascular ground-glass and fibrotic architectural distortion, particular in the  left upper lobe. There is evidence of interval left lung wedge biopsies. No evidence of air trapping on expiratory phase imaging. Findings are not significantly changed compared to prior examinations dating back to 02/07/2017 and remain in an "alternate diagnosis" pattern by ATS pulmonary fibrosis criteria, consistent with histologic diagnosis favoring NSIP.  2.  Stable, benign small pulmonary nodules.  3.  Coronary artery disease and aortic atherosclerosis.   Electronically Signed   By: Eddie Candle M.D.   On: 05/01/2019 13:25   IMPRESSION: 1. There is a redemonstrated pattern of apical predominant mild pulmonary fibrosis featuring mild traction bronchiectasis, subpleural irregular ground-glass and occasional areas of bronchiolectasis with some elements of peribronchovascular ground-glass and fibrotic architectural distortion, particular in the left upper lobe. There is evidence of interval left lung wedge biopsies. No evidence of air trapping on expiratory phase imaging. Findings are not significantly changed compared to prior examinations dating back to 02/07/2017 and remain in an "alternate diagnosis" pattern by ATS pulmonary fibrosis criteria, consistent with histologic diagnosis favoring NSIP.  2.  Stable, benign small pulmonary nodules.  3.  Coronary artery disease and aortic atherosclerosis.   Electronically Signed   By: Eddie Candle M.D.   On: 05/01/2019 13:25  ROS - per HPI     has a past medical history of Anemia, Chronic major depressive disorder, DDD (degenerative disc disease), cervical, Depression, Dyspnea, Hypertension, Interstitial lung disease ([place]), Mixed hyperlipidemia, Pain in joints of both feet, Pneumonia (2016), Polyarthropathy, Stroke [facility]), Vision abnormalities, and Vitamin D deficiency.    OV 02/14/2020  Subjective:  Patient ID: Sharon [name], female , DOB: 06/29/1951 , age 56 y.o. 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 Remot hx of RA Rx with NSAID but 2019 - autoimmune panel negative. Started prednisone 02/23/18 multidisciplinary ILD conference September 04, 2019 -NSIP as first diagnosis with hypersensitive pneumonitis is a second.  Treatment consideration is prednisone but if progresses then add antifibrotic.   -Course is overall of 1 of stability.  In the summer 2020 did get worse in the absence of prednisone but improved with prednisone.  #Normal stress test November 2020   02/14/2020 -   Chief Complaint  Patient presents with  . Follow-up    SOB with walking, no coughing. doing well.      HPI Sharon [name] 69 y.o. -presents for ILD follow-up.  After the last visit we did discuss her again at the case conference in December 2020.  NSIP is considered the leading diagnosis.  She is doing well on prednisone 10 mg/day.  She says the dyspnea is stable.  Although the symptom score maybe it is a little bit worse walking desaturation test is stable.  Pulmonary function test is stable/improved.  She has had a Covid vaccine.  She prefers to take prednisone at 10 mg/day.  She does not want to taper.  She is having some back pain.  She says primary care physician Chesley Noon, MD is monitoring her bone health.  Her recent cardiac stress test was normal.  I reviewed the results.    SYMPTOM SCALE - ILD 08/07/2019  02/14/2020   O2 use r   Shortness of Breath 0 -> 5 scale with 5 being worst (score 6 If unable to do)   At rest 0 0  Simple tasks - showers, clothes change, eating, shaving 0 0  Household (dishes, doing bed, laundry) 2 3  Shopping 1 2  Walking level at own pace 0 2  Walking up Stairs 5 5  Total (40 - 48) Dyspnea Score 8 12  How bad is your cough? 0 0  How bad is your fatigue 0 0  nausea  0  vomit  0  diarhea  00  anzity  0  depression  3       Simple office walk 185 feet x  3 laps goal  with forehead probe 02/23/2018  08/07/2019  02/14/2020   O2 used Room air Room air Room air  Number laps completed 3 3 3   Comments about pace slow Normal pace Nl pace  Resting Pulse Ox/HR 100% and 71/min 100% and 68 100% and 84/min  Final Pulse Ox/HR 99% and 89/min 99% and 87/min 100% and 96/min  Desaturated </= 88% no no no  Desaturated <= 3% points no no no  Got Tachycardic >/= 90/min no no no  Symptoms at end of test Mild dyspnea No complaints No complaints  Miscellaneous comments x       Results for Sharon, [name] (MRN [id number]) as of 02/14/2020 10:07  Ref. Range 12/13/2017 09:03 03/20/2018 09:11 05/18/2018 11:20 05/11/2019 13:59 01/25/2020 15:06  FVC-Pre Latest Units: L 1.65 2.05 2.06 1.76 -worsae without pred 2.01   Results for Sharon, [name] (MRN [id number]) as of 02/14/2020 10:07  Ref. Range 12/13/2017 09:03 03/20/2018 09:11 05/18/2018 11:20 05/11/2019 13:59 01/25/2020 15:06  DLCO unc Latest Units: ml/min/mmHg 13.89   15.51 15.12  DLCO unc % pred Latest Units: % 66   84 82      ROS - per HPI     has a past medical history of Anemia, Chronic major depressive disorder, DDD (  degenerative disc disease), cervical, Depression, Dyspnea, Hypertension, Interstitial lung disease ([place]), Mixed hyperlipidemia, Pain in joints of both feet, Pneumonia (2016), Polyarthropathy, Stroke [facility]), Vision abnormalities, and Vitamin D deficiency.   reports that she quit smoking about 30 years ago. Her smoking use included cigarettes. She started smoking about 50 years ago. She has a 35.00 pack-year smoking history. She has never used smokeless tobacco.  Past Surgical History:  Procedure Laterality Date  . CARPAL TUNNEL RELEASE    . CESAREAN SECTION     x2  . COLONOSCOPY    . LUNG BIOPSY Left 02/08/2018   Procedure: LUNG BIOPSY;  Surgeon: [place] Nakayama, MD;  Location: [facility];  Service: Thoracic;  Laterality: Left;  . right arm tendon surgery    . TAYLOR BUNIONECTOMY     2 on one foot and 1  on the other foot  . VIDEO ASSISTED THORACOSCOPY Left 02/08/2018   Procedure: VIDEO ASSISTED THORACOSCOPY;  Surgeon: [place] Nakayama, MD;  Location: [facility];  Service: Thoracic;  Laterality: Left;  Marland Kitchen VIDEO BRONCHOSCOPY N/A 02/08/2018   Procedure: VIDEO BRONCHOSCOPY;  Surgeon: [place] Nakayama, MD;  Location: [facility] OR;  Service: Thoracic;  Laterality: N/A;    Allergies  Allergen Reactions  . Codeine Nausea Only    GI upset     Immunization History  Administered Date(s) Administered  . Fluad Quad(high Dose 65+) 08/07/2019  . Influenza, High Dose Seasonal PF 10/25/2017  . Influenza-Unspecified 09/27/2013  . PFIZER SARS-COV-2 Vaccination 10/03/2019, 10/24/2019  . Pneumococcal Conjugate-13 10/25/2017  . Pneumococcal Polysaccharide-23 08/07/2019  . Pneumococcal-Unspecified 08/07/2008  . Zoster 12/06/2013    Family History  Problem Relation Age of Onset  . Emphysema Father   . Asthma Father   . Congestive Heart Failure Father   . Stroke Father   . Asthma Sister   . COPD Sister   . Alzheimer's disease Mother      Current Outpatient Medications:  .  albuterol (VENTOLIN HFA) 108 (90 Base) MCG/ACT inhaler, Inhale into the lungs., Disp: , Rfl:  .  ALPRAZolam (XANAX) 0.5 MG tablet, TAKE 1/2 TO 1 TABLET BY MOUTH DAILY AS NEEDED FOR ANXIETY. Limit use. Do not mix with tramadol., Disp: , Rfl:  .  aspirin EC 81 MG tablet, Take 81 mg by mouth daily., Disp: , Rfl:  .  atorvastatin (LIPITOR) 10 MG tablet, TAKE ONE TABLET BY MOUTH DAILY, Disp: , Rfl:  .  budesonide-formoterol (SYMBICORT) 80-4.5 MCG/ACT inhaler, Inhale 2 puffs into the lungs 2 (two) times daily., Disp: 1 Inhaler, Rfl: 0 .  budesonide-formoterol (SYMBICORT) 80-4.5 MCG/ACT inhaler, Take 2 puffs first thing in am and then another 2 puffs about 12 hours later., Disp: 1 Inhaler, Rfl: 6 .  calcium carbonate (TUMS EX) 750 MG chewable tablet, Chew 1 tablet by mouth daily as needed for heartburn., Disp: , Rfl:  .  chlorhexidine  (PERIDEX) 0.12 % solution, , Disp: , Rfl:  .  clopidogrel (PLAVIX) 75 MG tablet, Take by mouth., Disp: , Rfl:  .  dextromethorphan-guaiFENesin (MUCINEX DM) 30-600 MG per 12 hr tablet, Take 1 tablet by mouth 2 (two) times daily as needed (congestion). , Disp: , Rfl:  .  dorzolamide-timolol (COSOPT) 22.3-6.8 MG/ML ophthalmic solution, , Disp: , Rfl:  .  DULoxetine (CYMBALTA) 60 MG capsule, Take 30 mg by mouth in the morning, at noon, and at bedtime., Disp: , Rfl:  .  latanoprost (XALATAN) 0.005 % ophthalmic solution, Place 1 drop into both eyes at bedtime., Disp: , Rfl:  .  losartan (COZAAR) 50 MG tablet, Take 50 mg by mouth daily. , Disp: , Rfl:  .  predniSONE (DELTASONE) 10 MG tablet, TAKE ONE TABLET BY MOUTH DAILY WITH BREAKFAST, Disp: 30 tablet, Rfl: 2 .  traMADol (ULTRAM) 50 MG tablet, Take by mouth., Disp: , Rfl:       Objective:   Vitals:   02/14/20 1019  BP: 106/62  Pulse: 84  Temp: (!) 97.4 F (36.3 C)  TempSrc: Temporal  SpO2: 100%  Weight: 167 lb 3.2 oz (75.8 kg)  Height: 5\' 2"  (1.575 m)    Estimated body mass index is 30.58 kg/m as calculated from the following:   Height as of this encounter: 5\' 2"  (1.575 m).   Weight as of this encounter: 167 lb 3.2 oz (75.8 kg).  @WEIGHTCHANGE @  [employer]   02/14/20 1019  Weight: 167 lb 3.2 oz (75.8 kg)     Physical Exam Obese lady pleasant.  Nonfocal exam except for the fact she has got bilateral crackles.  The crackles do not have a craniocaudal gradient to them.  Upper lobe crackles and also lower lobe crackles.       Assessment:       ICD-10-CM   1. 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 ILD (interstitial lung disease) ([place])  J84.9        Plan:     Patient Instructions   Interstitial lung disease  Interstitial lung disease is stable.  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  Appears 10 mg prednisone is working well for you  Glad recent stress test was  fine  Plan -Continue prednisone at 10 mg/day -Continue to wear a mask especially with gardening -Repeat spirometry and DLCO in 6 months  Follow-up -30-minute ILD slot in 6 months or sooner if needed   (Level 04: Estb 30-39 min  visit type: on-site physical face to visit visit spent in total care time and counseling or/and coordination of care by this undersigned MD - Dr Brand Males. This includes one or more of the following on this same day 02/14/2020: pre-charting, chart review, note writing, documentation discussion of test results, diagnostic or treatment recommendations, prognosis, risks and benefits of management options, instructions, education, compliance or risk-factor reduction. It excludes time spent by the [place] or office staff in the care of the patient . Actual time is 30 min)    SIGNATURE    Dr. Brand Males, M.D., F.C.C.P,  Pulmonary and Critical Care Medicine Staff Physician, [place] Director - Interstitial Lung Disease  Program  Pulmonary [place] at [place], [place], [address]  Pager: [phone], If no answer or between  15:00h - 7:00h: call [phone] Telephone: [phone]  10:33 AM 02/14/2020

## 2020-02-14 NOTE — Addendum Note (Signed)
Addended by: Coralie Keens on: 02/14/2020 10:43 AM   Modules accepted: Orders

## 2020-04-03 ENCOUNTER — Other Ambulatory Visit: Payer: Self-pay | Admitting: Internal Medicine

## 2020-08-14 ENCOUNTER — Other Ambulatory Visit: Payer: Self-pay | Admitting: Internal Medicine

## 2021-01-01 ENCOUNTER — Other Ambulatory Visit: Payer: Self-pay | Admitting: Internal Medicine

## 2021-03-17 ENCOUNTER — Ambulatory Visit (INDEPENDENT_AMBULATORY_CARE_PROVIDER_SITE_OTHER): Payer: Medicare Other | Admitting: Internal Medicine

## 2021-03-17 ENCOUNTER — Other Ambulatory Visit: Payer: Self-pay

## 2021-03-17 ENCOUNTER — Encounter: Payer: Self-pay | Admitting: Internal Medicine

## 2021-03-17 VITALS — BP 118/64 | HR 78 | Ht 62.0 in | Wt 166.8 lb

## 2021-03-17 DIAGNOSIS — J849 Interstitial pulmonary disease, unspecified: Secondary | ICD-10-CM

## 2021-03-17 DIAGNOSIS — J329 Chronic sinusitis, unspecified: Secondary | ICD-10-CM

## 2021-03-17 DIAGNOSIS — J8489 Other specified interstitial pulmonary diseases: Secondary | ICD-10-CM

## 2021-03-17 MED ORDER — DOXYCYCLINE HYCLATE 100 MG PO TABS: 100.0000 mg | ORAL_TABLET | Freq: Two times a day (BID) | ORAL | 0 refills | Status: DC

## 2021-03-17 NOTE — Patient Instructions (Signed)
Recurrent sinusitis  - Unclear cause - Rt ear might have wax  Plan  - Take doxycycline 100mg  po twice daily x 7 days; take after meals and avoid sunlight - if turns into bronchitis call us and we can repeat prednisone  ILD (interstitial lung disease) ([place]) 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[place])   - at risk for flare up due to above  - currently stable likely  - long time since you were re-staged  Plan  - in 6 weeks to spiro/dlco and HRCT supone and prone  Followup  - with app or Trudee Chirino - whoever first available but after above -

## 2021-03-17 NOTE — Progress Notes (Signed)
OV 11/01/2017 - ILD clinic   - transfer of care and 2nd opinion  Chief Complaint  Patient presents with   Advice Only    Referred by Dr. Melford Aase due to an abnormal CT.  Pt does have complaints of a dry cough and SOB with exertion or if has a coughing episode.    70 year old female referred by the primary care physician to the ILD clinic for evaluation of her pulmonary problems with symptoms of cough and shortness of breath.  According to the patient for a better part of 2 years she has had episodic cough particularly in the fall season.  She says she is active in the outdoors doing gardening and cutting and blowing and mowing and she usually wears a mask but nevertheless she will get a bronchitis episode that will require nebulizers and prednisone to resolve.  Symptoms will usually resolve over 2-3 weeks.  However this time around it is taken 8 weeks to resolve.  Last prednisone was over a week or 2 ago.  She still has some ongoing wheezing.  In the background of this episodic cough she says between episodes she does not have any cough but she does have some baseline shortness of breath when she climbs a flight of stairs this is been stable and is of insidious onset also present for 2 years.  She did have a CT scan of the chest approximately 9 months ago in May 2018 that shows in my personal opinion based upon my personal visualization bilateral subpleural reticulation particularly in the upper lobes and some subpleural reticulation in the left lower lobe.  There is no clear distinct craniocaudal gradient in my opinion this would be indeterminate for UIP without a clear alternate etiology.  Given the presence of ILD findings she has been sent to the ILD clinic.  Review of the chart also shows she is seen.my colleague Dr. Melvyn Novas for asthma symptoms and is been placed on Symbicort which she takes 1 puff twice daily.  The exam nitric oxide a week after prednisone and while on Symbicort is elevated to near  abnormal levels at 48 ppb today  [employer] of chest physicians interstitial lung disease questionnaire -Past medical history: Positive for allergies.  IgE was elevated to 168 approximately a year or 2 ago on chart review.  In addition she gives a history of rheumatoid arthritis diagnosed many many years ago.  Seen by Dr. Keturah Barre at that time.  She is only followed up with nonsteroidal anti-inflammatory drugs.  She has not followed up with Dr. Keturah Barre in many years.  She feels she needs to go back.  However in May 2017 her autoimmune limited profile done here was negative.  -Personal exposure history: She has smoked marijuana in the past.  She smokes cigarettes from age 74 to age 39 a pack a day and quit.  -Family history of lung disease: Sister [name] who lives in [place] apparently has had a lung biopsy and has been diagnosed with sarcoidosis recently.  She is also a smoker.  Father died of congestive heart failure.  There is no diagnosis of pulmonary fibrosis or hypersensitivity pneumonitis  -Home exposure history: Has not lived in a house that is old in the last 10 years.  There is no humidifier or insomnia or hot tub or Jacuzzi or water damage or mold.  She has 1 dog.  She does not have any birds.  She does not use any feathered pillows.  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 Range 02/03/2016 11:20  Anit Nuclear Antibody(ANA) Latest Ref Range: NEGATIVE  NEG  Cyclic Citrullin Peptide Ab Latest Units: Units <16  RA Latex Turbid. Latest Ref Range:  <=14 IU/mL <10    Walking desaturation test on 11/01/2017 185 feet x 3 laps on ROOM AIR:  did not desaturate. Rest pulse ox was 100%, final pulse ox was 97%. HR response 88/min at rest to 100/min at peak exertion. Patient Sharon [name]  Did not Desaturate < 88% . Sharon [name] yes did  Desaturated </= 3% points. Sharon [name] yes did get tachyardic   FeNO - 48ppb and almost abnormal   OV 12/13/2017  Chief Complaint  Patient presents with   Follow-up    PFT done today. Pt states after last visit on 11/01/17, she developed bronchitis and had it x2 weeks. Pt states she still has a mild cough. Denies any SOB or CP.   Follow-up interstitial lung disease with asthma/allergy phenotype  She returns for follow-up after investigations.  [name] who is well-known to me through working to medical ICU where she is a Equities trader.  We did a history read taken patient tells me that she moved from [place] to [place] many years ago.  She does regular gardening and works in the yard [employer].  Many times the leads are damp and there is she suspects mold in it.  She also burns the lesion is exposed to the smoke.  Symbicort is helping but approximately 2 weeks ago had respiratory exacerbation that was not related to gardening [in fact she has not gotten this whole year] and ended up in the ER treated with antibiotics and prednisone.  Currently back to baseline.  She had pulmonary function test today that shows mixed obstruction and restriction associated with flow volume loop abnormalities.  She had a hypersensitivity pneumonitis panel documented below that is negative.  She had limited autoimmune panel [the CMA at last visit did not order the full panel] and this was normal.  She had repeat CT scan of the chest read by thoracic radiology I personally visualized this and agree with the findings.  It is indeterminate for UIP and there is no specific alternate  pattern.  No  air trapping is described.  The differential diagnosis appears to be broad.     Results for Sharon [name], Sharon [name] (MRN [id number]) as of 12/13/2017 09:57  Ref. Range 11/01/2017 16:22  Faenia retivirgula Latest Ref Range: NEGATIVE  NEGATIVE  S. VIRIDIS Latest Ref Range: NEGATIVE  NEGATIVE  T. CANDIDUS Latest Ref Range: NEGATIVE  NEGATIVE  T. VULGARIS Latest Ref Range: NEGATIVE  NEGATIVE    Results for Sharon [name], Sharon [name] (MRN [id number]) as of 12/13/2017 09:57  Ref. Range 11/01/2017 16:22  Anit Nuclear Antibody(ANA) Latest Ref Range: NEGATIVE  NEGATIVE  Angiotensin-Converting Enzyme Latest Ref Range: 9 - 67 U/L 25  Cyclic Citrullin Peptide Ab Latest Units: UNITS <16  ds DNA Ab Latest Units: IU/mL <1  RA Latex Turbid. Latest Ref Range: <14 IU/mL <  14  IgE (Immunoglobulin E), Serum Latest Ref Range: <OR=114 kU/L 252 (H)    IMPRESSION: CT FEb 2019 1. Pulmonary parenchymal pattern of fibrotic interstitial lung disease is unchanged from 02/07/2017 and may be due to nonspecific interstitial pneumonitis or mild chronic hypersensitivity pneumonitis. Findings are not consistent with usual interstitial pneumonitis. 2. Aortic atherosclerosis (ICD10-170.0). Coronary artery calcification.     Electronically Signed   By: Lorin Picket M.D.   On: 11/14/2017 12:56   OV 02/23/2018  Chief Complaint  Patient presents with   Follow-up    Pt had lung biopsy done and states she is doing good since then. States only time she has pain is when she sneezes. Denies any current complaints of cough, SOB, or CP.    Here to review SLB results -slides have been read by our resident pathologist with whom I discussed via email.  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  In addition he did see some eosinophilic infiltrates in the interstitium.  This can explain the high nitric oxide that she has.  She is here with her husband.  Since surgery  she is doing well.  She only has some pain in her chest when she sneezes at the postsurgical site.  She still has one suture hanging out which Dr. Roxan Hockey said was okay to remove and my LPN removed it.  But overall he she and her husband are here to discuss the pathology report.  They want to know prognosis and implications.  Her grandchild is now 50 days old and she wants to spend a lot of time taking care of the grandchild.  She is aware of prednisone side effects.  Her husband says that her mood might change because of prednisone but patient herself says she has tolerated prednisone just fine in the past.     OV 03/23/2018  Chief Complaint  Patient presents with   Follow-up    PFT performed 6/24.    Pt states she has been doing well since last visit. States only complaint is fatigue in the afternoons.    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 Remot hx of RA Rx with NSAID but 2019 - autoimmune panel negative. Started prednisone 02/23/18   Ms. Teachey is here to follow-up for her interstitial lung disease as above. She tells me that she is going to start 40 mg prednisone tomorrow. Meanwhile the prednisone has caused her to have 10 pound weight gain. Her clothes are barely fitting her. She is also having acidity and acid reflux for which she is not on treatment at this point. Otherwise overall she is tolerating things fine. There are no other new issues. She had pulmonary function test today and it shows 11% improvement in the postbronchodilator FVC. She feels her dyspnea has improved         OV 08/07/2019  Subjective:  Patient ID: Sharon [name], female , DOB: 1951/03/06 , age 38 y.o. 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 Remot hx of RA Rx with NSAID but 2019 - autoimmune panel negative. Started prednisone 02/23/18 08/07/2019 -   Chief Complaint   Patient presents with   Follow-up    Pt states she has been doing well since last visit and denies any complaints with breathing.     HPI Sharon [name] 70 y.o. -last seen in May 2019.  She saw  our nurse practitioner in June 2020 and because she was doing well prednisone was tapered and stopped.  But this then resulted in worsening shortness of breath and chest tightness.  She visited the beach in the summer and she found it very difficult to even walk short distances.  This because of shortness of breath and relieved by rest.  Then in August 2020 she saw a nurse practitioner again and had pulmonary function test which is shown below.  It declined.  She went back on prednisone and is currently at 10 mg/day.  She feels back at baseline.  She says she does not have any organic antigen or mold exposure.  She has a Human resources officer.  There is no mold in the house.  No birds in the house.  Walking desaturation test today shows baseline normal performance.  However she tells me that when she climbs stairs she feels very short of breath.  She does have associated obesity. She wants to have flu and pneumovax   She is also complaining of chronic right infraaxillary right paralumbar region pain.  She says she has had previous multiple lipomas with excision.  She feels its tender to touch.  There is no urinary discomfort.  There is no fever or rash in the area.  This is a longstanding and intermittent.  There is no back pain.  After she left I noted that CT scan three-vessel coronary artery calcification.  I do not know if she has had a stress test.    HRCT Aug 2020  IMPRESSION: 1. There is a redemonstrated pattern of apical predominant mild pulmonary fibrosis featuring mild traction bronchiectasis, subpleural irregular ground-glass and occasional areas of bronchiolectasis with some elements of peribronchovascular ground-glass and fibrotic architectural distortion, particular in the left upper  lobe. There is evidence of interval left lung wedge biopsies. No evidence of air trapping on expiratory phase imaging. Findings are not significantly changed compared to prior examinations dating back to 02/07/2017 and remain in an "alternate diagnosis" pattern by ATS pulmonary fibrosis criteria, consistent with histologic diagnosis favoring NSIP.   2.  Stable, benign small pulmonary nodules.   3.  Coronary artery disease and aortic atherosclerosis.     Electronically Signed   By: Eddie Candle M.D.   On: 05/01/2019 13:25    IMPRESSION: 1. There is a redemonstrated pattern of apical predominant mild pulmonary fibrosis featuring mild traction bronchiectasis, subpleural irregular ground-glass and occasional areas of bronchiolectasis with some elements of peribronchovascular ground-glass and fibrotic architectural distortion, particular in the left upper lobe. There is evidence of interval left lung wedge biopsies. No evidence of air trapping on expiratory phase imaging. Findings are not significantly changed compared to prior examinations dating back to 02/07/2017 and remain in an "alternate diagnosis" pattern by ATS pulmonary fibrosis criteria, consistent with histologic diagnosis favoring NSIP.   2.  Stable, benign small pulmonary nodules.   3.  Coronary artery disease and aortic atherosclerosis.     Electronically Signed   By: Eddie Candle M.D.   On: 05/01/2019 13:25   ROS - per HPI     has a past medical history of Anemia, Chronic major depressive disorder, DDD (degenerative disc disease), cervical, Depression, Dyspnea, Hypertension, Interstitial lung disease ([place]), Mixed hyperlipidemia, Pain in joints of both feet, Pneumonia (2016), Polyarthropathy, Stroke [facility]), Vision abnormalities, and Vitamin D deficiency.    OV 02/14/2020  Subjective:  Patient ID: Sharon [name], female , DOB: Mar 11, 1951 , age 63 y.o. 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 Remot hx of RA Rx with NSAID but 2019 - autoimmune panel negative. Started prednisone 02/23/18 multidisciplinary ILD conference September 04, 2019 -NSIP as first diagnosis with hypersensitive pneumonitis is a second.  Treatment consideration is prednisone but if progresses then add antifibrotic.   -Course is overall of 1 of stability.  In the summer 2020 did get worse in the absence of prednisone but improved with prednisone.  #Normal stress test November 2020   02/14/2020 -   Chief Complaint  Patient presents with   Follow-up    SOB with walking, no coughing. doing well.      HPI Sharon [name] 70 y.o. -presents for ILD follow-up.  After the last visit we did discuss her again at the case conference in December 2020.  NSIP is considered the leading diagnosis.  She is doing well on prednisone 10 mg/day.  She says the dyspnea is stable.  Although the symptom score maybe it is a little bit worse walking desaturation test is stable.  Pulmonary function test is stable/improved.  She has had a Covid vaccine.  She prefers to take prednisone at 10 mg/day.  She does not want to taper.  She is having some back pain.  She says primary care physician Sharon Noon, Sharon [name] is monitoring her bone health.  Her recent cardiac stress test was normal.  I reviewed the results.   Results for KEYANI, RIGDON (MRN [id number]) as of 02/14/2020 10:07  Ref. Range 12/13/2017 09:03 03/20/2018 09:11 05/18/2018 11:20 05/11/2019 13:59 01/25/2020 15:06  FVC-Pre Latest Units: L 1.65 2.05 2.06 1.76 -worsae without pred 2.01   Results for AMENA, DOCKHAM (MRN [id number]) as of 02/14/2020 10:07  Ref. Range 12/13/2017 09:03 03/20/2018 09:11 05/18/2018 11:20 05/11/2019 13:59 01/25/2020 15:06  DLCO unc Latest Units: ml/min/mmHg 13.89   15.51 15.12  DLCO unc % pred Latest Units: % 66   84 82        OV 03/17/2021  Subjective:  Patient ID: Sharon [name], female , DOB: [birth year] , age 36 y.o. , MRN: [id number] , ADDRESS: [place] [address] PCP Sharon Noon, Sharon [name] Patient Care Team: Sharon Noon, Sharon [name] as PCP - General (Family Medicine)  This Provider for this visit: Treatment Team:  Attending Provider: Brand Males, Sharon [name]    03/17/2021 -   Chief Complaint  Patient presents with   Follow-up    Pt states she was doing well up until 4 weeks ago. States 4 weeks ago she developed a head cold which then went in her chest and was prescribed abx and she states she is still having postnasal drainage and a cough.    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 Remot hx of RA Rx with NSAID but 2019 - autoimmune panel negative. Started prednisone 02/23/18 multidisciplinary ILD conference September 04, 2019 -NSIP as first diagnosis with hypersensitive pneumonitis is a second.  Treatment consideration is prednisone but if progresses then add antifibrotic.   -Course is overall of 1 of stability.  In the summer 2020 did get worse in the absence of prednisone but improved with prednisone.  #Normal stress test November 2020  HPI Sharon [name] 70 y.o. -last seen over a year ago.  She says she is doing really well from a ILD perspective and overall health is good.  Then approximately 1 month ago she feels she picked  up a head cold.  She is describing a sinus infection.  She was given erythromycin and then seem to get better but then a few weeks ago started having significant cough and mucus production.  But no change in shortness of breath.  PCP then gave her 12-day prednisone that ended yesterday.  She feels the cough is better but then she started having new onset of frequent sinus drainage that is green ears are plugged particularly the right ear [there is wax on the exam of the right ear].  Nevertheless she feels highly stable.  Primary care felt that she needed to be seen by pulmonary.  Her last  pulmonary function testing was a year ago and last CT scan was 2 years ago.    SYMPTOM SCALE - ILD 08/07/2019  02/14/2020  03/17/2021   O2 use r    Shortness of Breath 0 -> 5 scale with 5 being worst (score 6 If unable to do)    At rest 0 0 0  Simple tasks - showers, clothes change, eating, shaving 0 0 0  Household (dishes, doing bed, laundry) 2 3 2   Shopping 1 2 0  Walking level at own pace 0 2 2  Walking up Stairs 5 5 3   Total (40 - 48) Dyspnea Score 8 12 7   How bad is your cough? 0 0 2.5  How bad is your fatigue 0 0 3  nausea  0 0  vomit  0 0  diarhea  00 0  anzity  0 0  depression  3 3       Simple office walk 185 feet x  3 laps goal with forehead probe 02/23/2018  08/07/2019  02/14/2020  03/17/2021   O2 used Room air Room air Room air ra  Number laps completed 3 3 3 3   Comments about pace slow Normal pace Nl pace avg  Resting Pulse Ox/HR 100% and 71/min 100% and 68 100% and 84/min 1005 and 78  Final Pulse Ox/HR 99% and 89/min 99% and 87/min 100% and 96/min 99% and 97  Desaturated </= 88% no no no no  Desaturated <= 3% points no no no no  Got Tachycardic >/= 90/min no no yes yes  Symptoms at end of test Mild dyspnea No complaints No complaints No complaints  Miscellaneous comments x        CT Chest data  No results found.    PFT  PFT Results Latest Ref Rng & Units 01/25/2020 05/11/2019 05/18/2018 03/20/2018 12/13/2017  FVC-Pre L 2.01 1.76 2.06 2.05 1.65  FVC-Predicted Pre % 71 61 71 71 58  FVC-Post L - - - 2.16 1.94  FVC-Predicted Post % - - - 75 68  Pre FEV1/FVC % % 83 82 89 88 85  Post FEV1/FCV % % - - - 92 91  FEV1-Pre L 1.67 1.44 1.83 1.80 1.39  FEV1-Predicted Pre % 78 66 83 82 64  FEV1-Post L - - - 1.99 1.76  DLCO uncorrected ml/min/mmHg 15.12 15.51 - - 13.89  DLCO UNC% % 82 84 - - 66  DLCO corrected ml/min/mmHg 15.12 - - - 14.35  DLCO COR %Predicted % 82 - - - 22  DLVA Predicted % 108 115 - - 106       has a past medical history of Anemia,  Chronic major depressive disorder, DDD (degenerative disc disease), cervical, Depression, Dyspnea, Hypertension, Interstitial lung disease ([place]), Mixed hyperlipidemia, Pain in joints of both feet, Pneumonia (2016), Polyarthropathy, Stroke ([place]), Vision abnormalities,  and Vitamin D deficiency.   reports that she quit smoking about 31 years ago. Her smoking use included cigarettes. She started smoking about 51 years ago. She has a 35.00 pack-year smoking history. She has never used smokeless tobacco.  Past Surgical History:  Procedure Laterality Date   CARPAL TUNNEL RELEASE     CESAREAN SECTION     x2   COLONOSCOPY     LUNG BIOPSY Left 02/08/2018   Procedure: LUNG BIOPSY;  Surgeon: Melrose Nakayama, Sharon [name];  Location: [facility];  Service: Thoracic;  Laterality: Left;   right arm tendon surgery     TAYLOR BUNIONECTOMY     2 on one foot and 1 on the other foot   VIDEO ASSISTED THORACOSCOPY Left 02/08/2018   Procedure: [place];  Surgeon: Melrose Nakayama, Sharon [name];  Location: [facility];  Service: Thoracic;  Laterality: Left;   VIDEO BRONCHOSCOPY N/A 02/08/2018   Procedure: VIDEO BRONCHOSCOPY;  Surgeon: Melrose Nakayama, Sharon [name];  Location: [facility];  Service: Thoracic;  Laterality: N/A;    Allergies  Allergen Reactions   Codeine Nausea Only    GI upset     Immunization History  Administered Date(s) Administered   Fluad Quad(high Dose 65+) 08/07/2019   Influenza, High Dose Seasonal PF 10/25/2017, 05/28/2020   Influenza-Unspecified 09/27/2013   PFIZER(Purple Top)SARS-COV-2 Vaccination 10/03/2019, 10/24/2019, 05/28/2020   Pneumococcal Conjugate-13 10/25/2017   Pneumococcal Polysaccharide-23 08/07/2019   Pneumococcal-Unspecified 08/07/2008   Zoster, Live 12/06/2013    Family History  Problem Relation Age of Onset   Emphysema Father    Asthma Father    Congestive Heart Failure Father    Stroke Father    Asthma Sister    COPD Sister    Alzheimer's disease Mother       Current Outpatient Medications:    albuterol (VENTOLIN HFA) 108 (90 Base) MCG/ACT inhaler, Inhale into the lungs., Disp: , Rfl:    ALPRAZolam (XANAX) 0.5 MG tablet, TAKE 1/2 TO 1 TABLET BY MOUTH DAILY AS NEEDED FOR ANXIETY. Limit use. Do not mix with tramadol., Disp: , Rfl:    aspirin EC 81 MG tablet, Take 81 mg by mouth daily., Disp: , Rfl:    atorvastatin (LIPITOR) 10 MG tablet, TAKE ONE TABLET BY MOUTH DAILY, Disp: , Rfl:    budesonide-formoterol (SYMBICORT) 80-4.5 MCG/ACT inhaler, Inhale 2 puffs into the lungs 2 (two) times daily., Disp: 1 Inhaler, Rfl: 0   calcium carbonate (TUMS EX) 750 MG chewable tablet, Chew 1 tablet by mouth daily as needed for heartburn., Disp: , Rfl:    clopidogrel (PLAVIX) 75 MG tablet, Take by mouth., Disp: , Rfl:    dextromethorphan-guaiFENesin (MUCINEX DM) 30-600 MG per 12 hr tablet, Take 1 tablet by mouth 2 (two) times daily as needed (congestion). , Disp: , Rfl:    dorzolamide-timolol (COSOPT) 22.3-6.8 MG/ML ophthalmic solution, , Disp: , Rfl:    DULoxetine (CYMBALTA) 60 MG capsule, Take 30 mg by mouth in the morning, at [name], and at bedtime., Disp: , Rfl:    latanoprost (XALATAN) 0.005 % ophthalmic solution, Place 1 drop into both eyes at bedtime., Disp: , Rfl:    losartan (COZAAR) 50 MG tablet, Take 50 mg by mouth daily. , Disp: , Rfl:    predniSONE (DELTASONE) 10 MG tablet, TAKE ONE TABLET BY MOUTH DAILY WITH BREAKFAST, Disp: 30 tablet, Rfl: 1      Objective:   Vitals:   03/17/21 1124  BP: 118/64  Pulse: 78  SpO2: 100%  Weight: 166  lb 12.8 oz (75.7 kg)  Height: 5\' 2"  (1.575 m)    Estimated body mass index is 30.51 kg/m as calculated from the following:   Height as of this encounter: 5\' 2"  (1.575 m).   Weight as of this encounter: 166 lb 12.8 oz (75.7 kg).  @WEIGHTCHANGE @  [employer]   03/17/21 1124  Weight: 166 lb 12.8 oz (75.7 kg)     Physical ExamGeneral: No distress. Looks well Neuro: Alert and Oriented x 3. GCS 15. Speech  normal Psych: Pleasant Resp:  Barrel Chest - no.  Wheeze - no, Crackles - no, No overt respiratory distress CVS: Normal heart sounds. Murmurs - no Ext: Stigmata of Connective Tissue Disease - no HEENT: Normal upper airway. PEERL +. No post nasal drip. RT EAR WAX        Assessment:       ICD-10-CM   1. Recurrent sinusitis  J32.9     2. ILD (interstitial lung disease) ([place])  J84.9     3. 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[place])  J84.89          Plan:     Patient Instructions  Recurrent sinusitis  - Unclear cause - Rt ear might have wax  Plan  - Take doxycycline 100mg  po twice daily x 7 days; take after meals and avoid sunlight - if turns into bronchitis call us and we can repeat prednisone  ILD (interstitial lung disease) ([facility]) 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[place])   - at risk for flare up due to above  - currently stable likely  - long time since you were re-staged  Plan  - in 6 weeks to spiro/dlco and HRCT supone and prone  Followup  - with app or Said Rueb - whoever first available but after above -     SIGNATURE    Dr. Brand Males, M.D., F.C.C.P,  Pulmonary and Critical Care Medicine Staff Physician, [place] Director - Interstitial Lung Disease  Program  [facility] [place], [place], [address]  Pager: [phone], If no answer or between  15:00h - 7:00h: call [phone] Telephone: [phone]  12:21 PM 03/17/2021

## 2021-03-25 ENCOUNTER — Other Ambulatory Visit: Payer: Self-pay | Admitting: Internal Medicine

## 2021-03-26 NOTE — Telephone Encounter (Signed)
Please advise if you want this patient to continue taking her prednisone. Per the last note I did not see that this was long term. Patient reports that she has been taking this medication for 3 years for her lung disease. Please advise.

## 2021-04-01 NOTE — Telephone Encounter (Signed)
For now continue prednisone 10mg  per day

## 2021-05-01 ENCOUNTER — Ambulatory Visit
Admission: RE | Admit: 2021-05-01 | Discharge: 2021-05-01 | Disposition: A | Discharge status: Home / Self Care | Payer: Medicare Other | Source: Ambulatory Visit | Attending: Internal Medicine | Admitting: Internal Medicine

## 2021-05-01 DIAGNOSIS — J849 Interstitial pulmonary disease, unspecified: Secondary | ICD-10-CM

## 2021-05-26 ENCOUNTER — Encounter: Payer: Self-pay | Admitting: Internal Medicine

## 2021-05-26 ENCOUNTER — Other Ambulatory Visit: Payer: Self-pay

## 2021-05-26 ENCOUNTER — Ambulatory Visit (INDEPENDENT_AMBULATORY_CARE_PROVIDER_SITE_OTHER): Payer: Medicare Other | Admitting: Internal Medicine

## 2021-05-26 VITALS — BP 122/64 | HR 75 | Temp 98.2°F | Ht 62.0 in | Wt 167.8 lb

## 2021-05-26 DIAGNOSIS — J849 Interstitial pulmonary disease, unspecified: Secondary | ICD-10-CM

## 2021-05-26 DIAGNOSIS — J8489 Other specified interstitial pulmonary diseases: Secondary | ICD-10-CM

## 2021-05-26 DIAGNOSIS — Z7952 Long term (current) use of systemic steroids: Secondary | ICD-10-CM

## 2021-05-26 MED ORDER — PREDNISONE 1 MG PO TABS: 3.0000 mg | ORAL_TABLET | Freq: Every day | ORAL | 3 refills | Status: DC

## 2021-05-26 MED ORDER — PREDNISONE 5 MG PO TABS: 5.0000 mg | ORAL_TABLET | Freq: Every day | ORAL | 3 refills | Status: DC

## 2021-05-26 NOTE — Patient Instructions (Addendum)
   ILD (interstitial lung disease) (HCC) 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HCC)   - currently stable on CT x 2 yers on long term prednisone '10mg'$  per day  Plan - reduce prednisone '8mg'$  per day  - in 6 months  to spiro/dlco   Followup  - with Dr Chase Caller for 15 min visit   - symptom score and walk test at [facility] - return sooner if neede

## 2021-05-26 NOTE — Progress Notes (Signed)
**Note Sharon [name]-Identified via Obfuscation** OV 11/01/2017 - ILD clinic   - transfer of care and 2nd opinion  Chief Complaint  Patient presents with   Advice Only    Referred by Dr. Melford [name] due to an abnormal CT.  Pt does have complaints of a dry cough and SOB with exertion or if has a coughing episode.    70 year old female referred by the primary care physician to the ILD clinic for evaluation of her pulmonary problems with symptoms of cough and shortness of breath.  According to the patient for a better part of 2 years she has had episodic cough particularly in the fall season.  She says she is active in the outdoors doing gardening and cutting and blowing and mowing and she usually wears a mask but nevertheless she will get a bronchitis episode that will require nebulizers and prednisone to resolve.  Symptoms will usually resolve over 2-3 weeks.  However this time around it is taken 8 weeks to resolve.  Last prednisone was over a week or 2 ago.  She still has some ongoing wheezing.  In the background of this episodic cough she says between episodes she does not have any cough but she does have some baseline shortness of breath when she climbs a flight of stairs this is been stable and is of insidious onset also present for 2 years.  She did have a CT scan of the chest approximately 9 months ago in May 2018 that shows in my personal opinion based upon my personal visualization bilateral subpleural reticulation particularly in the upper lobes and some subpleural reticulation in the left lower lobe.  There is no clear distinct craniocaudal gradient in my opinion this would be indeterminate for UIP without a clear alternate etiology.  Given the presence of ILD findings she has been sent to the ILD clinic.  Review of the chart also shows she is seen.my colleague Dr. Melvyn [name] for asthma symptoms and is been placed on Symbicort which she takes 1 puff twice daily.  The exam nitric oxide a week after prednisone and while on Symbicort is elevated to near  abnormal levels at 48 ppb today  [employer] of chest physicians interstitial lung disease questionnaire -Past medical history: Positive for allergies.  IgE was elevated to 168 approximately a year or 2 ago on chart review.  In addition she gives a history of rheumatoid arthritis diagnosed many many years ago.  Seen by Dr. Keturah [name] at that time.  She is only followed up with nonsteroidal anti-inflammatory drugs.  She has not followed up with Dr. Keturah [name] in many years.  She feels she needs to go back.  However in May 2017 her autoimmune limited profile done here was negative.  -Personal exposure history: She has smoked marijuana in the past.  She smokes cigarettes from age 5 to age 38 a pack a day and quit.  -Family history of lung disease: Sister Sharon [name] who lives in [place] apparently has had a lung biopsy and has been diagnosed with sarcoidosis recently.  She is also a smoker.  Father died of congestive heart failure.  There is no diagnosis of pulmonary fibrosis or hypersensitivity pneumonitis  -Home exposure history: Has not lived in a house that is old in the last 10 years.  There is no humidifier or insomnia or hot tub or Jacuzzi or water damage or mold.  She has 1 dog.  She does not have any birds.  She does not use any feathered pillows.  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 Range 02/03/2016 11:20  Anit Nuclear Antibody(ANA) Latest Ref Range: NEGATIVE  NEG  Cyclic Citrullin Peptide Ab Latest Units: Units <16  RA Latex Turbid. Latest Ref Range:  <=14 IU/mL <10    Walking desaturation test on 11/01/2017 185 feet x 3 laps on ROOM AIR:  did not desaturate. Rest pulse ox was 100%, final pulse ox was 97%. HR response 88/min at rest to 100/min at peak exertion. Patient Sharon [name]  Did not Desaturate < 88% . Sharon [name] yes did  Desaturated </= 3% points. Sharon [name] yes did get tachyardic   FeNO - 48ppb and almost abnormal   OV 12/13/2017  Chief Complaint  Patient presents with   Follow-up    PFT done today. Pt states after last visit on 11/01/17, she developed bronchitis and had it x2 weeks. Pt states she still has a mild cough. Denies any SOB or CP.   Follow-up interstitial lung disease with asthma/allergy phenotype  She returns for follow-up after investigations.  [name] who is well-known to me through working to medical ICU where she is a Equities trader.  We did a history read taken patient tells me that she moved from [place] to [place] many years ago.  She does regular gardening and works in the yard [name] [employer].  Many times the leads are damp and there is she suspects mold in it.  She also burns the lesion is exposed to the smoke.  Symbicort is helping but approximately 2 weeks ago had respiratory exacerbation that was not related to gardening [in fact she has not gotten this whole year] and ended up in the ER treated with antibiotics and prednisone.  Currently back to baseline.  She had pulmonary function test today that shows mixed obstruction and restriction associated with flow volume loop abnormalities.  She had a hypersensitivity pneumonitis panel documented below that is negative.  She had limited autoimmune panel [the CMA at last visit did not order the full panel] and this was normal.  She had repeat CT scan of the chest read by thoracic radiology I personally visualized this and agree with the findings.  It is indeterminate for UIP and there is no specific alternate  pattern.  No  air trapping is described.  The differential diagnosis appears to be broad.     Results for Sharon [name], DEUTSCH (MRN [id number]) as of 12/13/2017 09:57  Ref. Range 11/01/2017 16:22  Faenia retivirgula Latest Ref Range: NEGATIVE  NEGATIVE  S. VIRIDIS Latest Ref Range: NEGATIVE  NEGATIVE  T. CANDIDUS Latest Ref Range: NEGATIVE  NEGATIVE  T. VULGARIS Latest Ref Range: NEGATIVE  NEGATIVE    Results for THAO, MANSELL (MRN [id number]) as of 12/13/2017 09:57  Ref. Range 11/01/2017 16:22  Anit Nuclear Antibody(ANA) Latest Ref Range: NEGATIVE  NEGATIVE  Angiotensin-Converting Enzyme Latest Ref Range: 9 - 67 U/L 25  Cyclic Citrullin Peptide Ab Latest Units: UNITS <16  ds DNA Ab Latest Units: IU/mL <1  RA Latex Turbid. Latest Ref Range: <14 IU/mL <  14  IgE (Immunoglobulin E), Serum Latest Ref Range: <OR=114 kU/L 252 (H)    IMPRESSION: CT FEb 2019 1. Pulmonary parenchymal pattern of fibrotic interstitial lung disease is unchanged from 02/07/2017 and may be due to nonspecific interstitial pneumonitis or mild chronic hypersensitivity pneumonitis. Findings are not consistent with usual interstitial pneumonitis. 2. Aortic atherosclerosis (ICD10-170.0). Coronary artery calcification.     Electronically Signed   By: Lorin Picket M.D.   On: 11/14/2017 12:56   OV 02/23/2018  Chief Complaint  Patient presents with   Follow-up    Pt had lung biopsy done and states she is doing good since then. States only time she has pain is when she sneezes. Denies any current complaints of cough, SOB, or CP.    Here to review SLB results -slides have been read by our resident pathologist with whom I discussed via email.  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  In addition he did see some eosinophilic infiltrates in the interstitium.  This can explain the high nitric oxide that she has.  She is here with her husband.  Since surgery  she is doing well.  She only has some pain in her chest when she sneezes at the postsurgical site.  She still has one suture hanging out which Dr. Roxan Hockey said was okay to remove and my LPN removed it.  But overall he she and her husband are here to discuss the pathology report.  They want to know prognosis and implications.  Her grandchild is now 79 days old and she wants to spend a lot of time taking care of the grandchild.  She is aware of prednisone side effects.  Her husband says that her mood might change because of prednisone but patient herself says she has tolerated prednisone just fine in the past.     OV 03/23/2018  Chief Complaint  Patient presents with   Follow-up    PFT performed 6/24.    Pt states she has been doing well since last visit. States only complaint is fatigue in the afternoons.    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 Remot hx of RA Rx with NSAID but 2019 - autoimmune panel negative. Started prednisone 02/23/18   Ms. Westerman is here to follow-up for her interstitial lung disease as above. She tells me that she is going to start 40 mg prednisone tomorrow. Meanwhile the prednisone has caused her to have 10 pound weight gain. Her clothes are barely fitting her. She is also having acidity and acid reflux for which she is not on treatment at this point. Otherwise overall she is tolerating things fine. There are no other new issues. She had pulmonary function test today and it shows 11% improvement in the postbronchodilator FVC. She feels her dyspnea has improved         OV 08/07/2019  Subjective:  Patient ID: Sharon [name], female , DOB: 08/03/1951 , age 46 y.o. 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 Remot hx of RA Rx with NSAID but 2019 - autoimmune panel negative. Started prednisone 02/23/18 08/07/2019 -   Chief Complaint   Patient presents with   Follow-up    Pt states she has been doing well since last visit and denies any complaints with breathing.     HPI SUMAYO FOGLIA 70 y.o. -last seen in May 2019.  She saw  our nurse practitioner in June 2020 and because she was doing well prednisone was tapered and stopped.  But this then resulted in worsening shortness of breath and chest tightness.  She visited the beach in the summer and she found it very difficult to even walk short distances.  This because of shortness of breath and relieved by rest.  Then in August 2020 she saw a nurse practitioner again and had pulmonary function test which is shown below.  It declined.  She went back on prednisone and is currently at 10 mg/day.  She feels back at baseline.  She says she does not have any organic antigen or mold exposure.  She has a Human resources officer.  There is no mold in the house.  No birds in the house.  Walking desaturation test today shows baseline normal performance.  However she tells me that when she climbs stairs she feels very short of breath.  She does have associated obesity. She wants to have flu and pneumovax   She is also complaining of chronic right infraaxillary right paralumbar region pain.  She says she has had previous multiple lipomas with excision.  She feels its tender to touch.  There is no urinary discomfort.  There is no fever or rash in the area.  This is a longstanding and intermittent.  There is no back pain.  After she left I noted that CT scan three-vessel coronary artery calcification.  I do not know if she has had a stress test.    HRCT Aug 2020  IMPRESSION: 1. There is a redemonstrated pattern of apical predominant mild pulmonary fibrosis featuring mild traction bronchiectasis, subpleural irregular ground-glass and occasional areas of bronchiolectasis with some elements of peribronchovascular ground-glass and fibrotic architectural distortion, particular in the left upper  lobe. There is evidence of interval left lung wedge biopsies. No evidence of air trapping on expiratory phase imaging. Findings are not significantly changed compared to prior examinations dating back to 02/07/2017 and remain in an "alternate diagnosis" pattern by ATS pulmonary fibrosis criteria, consistent with histologic diagnosis favoring NSIP.   2.  Stable, benign small pulmonary nodules.   3.  Coronary artery disease and aortic atherosclerosis.     Electronically Signed   By: Eddie Candle M.D.   On: 05/01/2019 13:25    IMPRESSION: 1. There is a redemonstrated pattern of apical predominant mild pulmonary fibrosis featuring mild traction bronchiectasis, subpleural irregular ground-glass and occasional areas of bronchiolectasis with some elements of peribronchovascular ground-glass and fibrotic architectural distortion, particular in the left upper lobe. There is evidence of interval left lung wedge biopsies. No evidence of air trapping on expiratory phase imaging. Findings are not significantly changed compared to prior examinations dating back to 02/07/2017 and remain in an "alternate diagnosis" pattern by ATS pulmonary fibrosis criteria, consistent with histologic diagnosis favoring NSIP.   2.  Stable, benign small pulmonary nodules.   3.  Coronary artery disease and aortic atherosclerosis.     Electronically Signed   By: Eddie Candle M.D.   On: 05/01/2019 13:25   ROS - per HPI     has a past medical history of Anemia, Chronic major depressive disorder, DDD (degenerative disc disease), cervical, Depression, Dyspnea, Hypertension, Interstitial lung disease ([place]), Mixed hyperlipidemia, Pain in joints of both feet, Pneumonia (2016), Polyarthropathy, Stroke [name]), Vision abnormalities, and Vitamin D deficiency.    OV 02/14/2020  Subjective:  Patient ID: Sharon [name], female , DOB: October 18, 1950 , age 49 y.o. 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 Remot hx of RA Rx with NSAID but 2019 - autoimmune panel negative. Started prednisone 02/23/18 multidisciplinary ILD conference September 04, 2019 -NSIP as first diagnosis with hypersensitive pneumonitis is a second.  Treatment consideration is prednisone but if progresses then add antifibrotic.   -Course is overall of 1 of stability.  In the summer 2020 did get worse in the absence of prednisone but improved with prednisone.  #Normal stress test November 2020   02/14/2020 -   Chief Complaint  Patient presents with   Follow-up    SOB with walking, no coughing. doing well.      HPI Sharon [name] 70 y.o. -presents for ILD follow-up.  After the last visit we did discuss her again at the case conference in December 2020.  NSIP is considered the leading diagnosis.  She is doing well on prednisone 10 mg/day.  She says the dyspnea is stable.  Although the symptom score maybe it is a little bit worse walking desaturation test is stable.  Pulmonary function test is stable/improved.  She has had a Covid vaccine.  She prefers to take prednisone at 10 mg/day.  She does not want to taper.  She is having some back pain.  She says primary care physician Chesley Noon, MD is monitoring her bone health.  Her recent cardiac stress test was normal.  I reviewed the results.   Results for JUAN, BERNSTEIN (MRN [id number]) as of 02/14/2020 10:07  Ref. Range 12/13/2017 09:03 03/20/2018 09:11 05/18/2018 11:20 05/11/2019 13:59 01/25/2020 15:06  FVC-Pre Latest Units: L 1.65 2.05 2.06 1.76 -worsae without pred 2.01   Results for VARVARA, WALLACE (MRN [id number]) as of 02/14/2020 10:07  Ref. Range 12/13/2017 09:03 03/20/2018 09:11 05/18/2018 11:20 05/11/2019 13:59 01/25/2020 15:06  DLCO unc Latest Units: ml/min/mmHg 13.89   15.51 15.12  DLCO unc % pred Latest Units: % 66   84 82        OV 03/17/2021  Subjective:  Patient ID: Sharon [name], female , DOB: [birth year] , age 60 y.o. , MRN: [id number] , ADDRESS: [place] [id number] PCP Chesley Noon, MD Patient Care Team: Chesley Noon, MD as PCP - General (Family Medicine)  This Provider for this visit: Treatment Team:  Attending Provider: Brand Males, MD    03/17/2021 -   Chief Complaint  Patient presents with   Follow-up    Pt states she was doing well up until 4 weeks ago. States 4 weeks ago she developed a head cold which then went in her chest and was prescribed abx and she states she is still having postnasal drainage and a cough.    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 Remot hx of RA Rx with NSAID but 2019 - autoimmune panel negative. Started prednisone 02/23/18 multidisciplinary ILD conference September 04, 2019 -NSIP as first diagnosis with hypersensitive pneumonitis is a second.  Treatment consideration is prednisone but if progresses then add antifibrotic.   -Course is overall of 1 of stability.  In the summer 2020 did get worse in the absence of prednisone but improved with prednisone.  #Normal stress test November 2020  HPI Sharon [name] 70 y.o. -last seen over a year ago.  She says she is doing really well from a ILD perspective and overall health is good.  Then approximately 1 month ago she feels she picked  up a head cold.  She is describing a sinus infection.  She was given erythromycin and then seem to get better but then a few weeks ago started having significant cough and mucus production.  But no change in shortness of breath.  PCP then gave her 12-day prednisone that ended yesterday.  She feels the cough is better but then she started having new onset of frequent sinus drainage that is green ears are plugged particularly the right ear [there is wax on the exam of the right ear].  Nevertheless she feels highly stable.  Primary care felt that she needed to be seen by pulmonary.  Her last  pulmonary function testing was a year ago and last CT scan was 2 years ago.   No results found.    PFT   OV 05/26/2021  Subjective:  Patient ID: Sharon [name], female , DOB: 1951-06-15 , age 15 y.o. , MRN: [id number] , ADDRESS: [address] [place] [address] PCP Chesley Noon, MD Patient Care Team: Chesley Noon, MD as PCP - General (Family Medicine)  This Provider for this visit: Treatment Team:  Attending Provider: Brand Males, MD    05/26/2021 -   Chief Complaint  Patient presents with   Follow-up    Pt states she has been doing okay since last visit and denies any complaints.    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 Remot hx of RA Rx with NSAID but 2019 - autoimmune panel negative. Started prednisone 02/23/18 multidisciplinary ILD conference September 04, 2019 -NSIP as first diagnosis with hypersensitive pneumonitis is a second.  Treatment consideration is prednisone but if progresses then add antifibrotic.   -Course is overall of 1 of stability.  In the summer 2020 did get worse in the absence of prednisone but improved with prednisone.  #Normal stress test November 20200 0 H0PI Sharon [name] 70 y.o. -continues to do well.  Symptom score is stable between June 2022 and now.  She had high-resolution CT chest in August 2022 shows 2-year stability.  I discussed this result to her.  She was supposed to have pulmonary function test but she forgot to schedule that.  We do not have the data but she feels stable and the CT scan is stable.  She continues on prednisone 10 mg/day.  She is tolerating it well but we discussed about possible reduction in dose she is open to this idea.  We did notice that 2 years ago when she stopped prednisone she started doing poorly with lung function.  Therefore we feel there is a floor dose which she needs.  We agreed that we would slowly reduce the prednisone     SYMPTOM SCALE - ILD  08/07/2019  02/14/2020  03/17/2021  05/26/2021   O2 use r     Shortness of Breath 0 -> 5 scale with 5 being worst (score 6 If unable to do)     At rest 0 0 0 0  Simple tasks - showers, clothes change, eating, shaving 0 0 0 0  Household (dishes, doing bed, laundry) '2 3 2 '$ 0  Shopping 1 2 0 0  Walking level at own pace 0 '2 2 3  '$ Walking up Stairs '5 5 3 4  '$ Total (40 - 48) Dyspnea Score '8 12 7 7  '$ How bad is your cough? 0 0 2.5 0  How bad is your fatigue 0 0 3 4  nausea  0 0 0  vomit  0  0 0  diarhea  00 0 0  anzity  0 0 0  depression  '3 3 2       '$ Simple office walk 185 feet x  3 laps goal with forehead probe 02/23/2018  08/07/2019  02/14/2020  03/17/2021   O2 used Room air Room air Room air ra  Number laps completed '3 3 3 3  '$ Comments about pace slow Normal pace Nl pace avg  Resting Pulse Ox/HR 100% and 71/min 100% and 68 100% and 84/min 100% and 78  Final Pulse Ox/HR 99% and 89/min 99% and 87/min 100% and 96/min 99% and 97  Desaturated </= 88% no no no no  Desaturated <= 3% points no no no no  Got Tachycardic >/= 90/min no no yes yes  Symptoms at end of test Mild dyspnea No complaints No complaints No complaints  Miscellaneous comments x        CT Chest data Aug  2022  Narrative & Impression  CLINICAL DATA:  Interstitial lung disease.   EXAM: CT CHEST WITHOUT CONTRAST   TECHNIQUE: Multidetector CT imaging of the chest was performed following the standard protocol without intravenous contrast. High resolution imaging of the lungs, as well as inspiratory and expiratory imaging, was performed.   COMPARISON:  05/01/2019.   FINDINGS: Cardiovascular: Atherosclerotic calcification of the aorta and coronary arteries. Heart is at the upper limits of normal in size. No pericardial effusion.   Mediastinum/Nodes: No pathologically enlarged mediastinal or axillary lymph nodes. Hilar regions are difficult to definitively evaluate without IV contrast but appear grossly  unremarkable. Esophagus is grossly unremarkable. Small hiatal hernia.   Lungs/Pleura: Upper and midlung zone predominant interstitial coarsening, traction bronchiectasis/bronchiolectasis, ground-glass and subpleural reticular densities, unchanged from 05/01/2019. Postoperative scarring in the left upper and left lower lobes. 4 mm peripheral right upper lobe nodule (9/48), unchanged and benign. There is air trapping. No pleural fluid. Airway is unremarkable.   Upper Abdomen: Low-attenuation lesions in the liver measure up to 1.6 cm and are likely cysts. Visualized portions of the liver, gallbladder, adrenal glands, kidneys, spleen, pancreas, stomach and bowel are otherwise unremarkable with the exception of a small hiatal hernia.   Musculoskeletal: No worrisome lytic or sclerotic lesions.   IMPRESSION: 1. Pulmonary parenchymal pattern of fibrosis, as described above, unchanged from 05/01/2019 and compatible with biopsy-proven fibrotic nonspecific interstitial pneumonitis. Findings are suggestive of an alternative diagnosis (not UIP) per consensus guidelines: Diagnosis of Idiopathic Pulmonary Fibrosis: An Official ATS/ERS/JRS/ALAT Clinical Practice Guideline. [place], Iss 5, [phone], May 28 2017. 2. Aortic atherosclerosis (ICD10-I70.0). Coronary artery calcification.     Electronically Signed   By: Lorin Picket M.D.   On: 05/04/2021 13:58       PFT  PFT Results Latest Ref Rng & Units 01/25/2020 05/11/2019 05/18/2018 03/20/2018 12/13/2017  FVC-Pre L 2.01 1.76 2.06 2.05 1.65  FVC-Predicted Pre % 71 61 71 71 58  FVC-Post L - - - 2.16 1.94  FVC-Predicted Post % - - - 75 68  Pre FEV1/FVC % % 83 82 89 88 85  Post FEV1/FCV % % - - - 92 91  FEV1-Pre L 1.67 1.44 1.83 1.80 1.39  FEV1-Predicted Pre % 78 66 83 82 64  FEV1-Post L - - - 1.99 1.76  DLCO uncorrected ml/min/mmHg 15.12 15.51 - - 13.89  DLCO UNC% % 82 84 - - 66  DLCO corrected ml/min/mmHg 15.12  - - - 14.35  DLCO COR %Predicted % 82 - - -  43  DLVA Predicted % 108 115 - - 106       has a past medical history of Anemia, Chronic major depressive disorder, DDD (degenerative disc disease), cervical, Depression, Dyspnea, Hypertension, Interstitial lung disease ([place]), Mixed hyperlipidemia, Pain in joints of both feet, Pneumonia (2016), Polyarthropathy, Stroke ([place]), Vision abnormalities, and Vitamin D deficiency.   reports that she quit smoking about 31 years ago. Her smoking use included cigarettes. She started smoking about 51 years ago. She has a 35.00 pack-year smoking history. She has never used smokeless tobacco.  Past Surgical History:  Procedure Laterality Date   CARPAL TUNNEL RELEASE     CESAREAN SECTION     x2   COLONOSCOPY     LUNG BIOPSY Left 02/08/2018   Procedure: LUNG BIOPSY;  Surgeon: Melrose Nakayama, MD;  Location: [place];  Service: Thoracic;  Laterality: Left;   right arm tendon surgery     [facility] BUNIONECTOMY     2 on one foot and 1 on the other foot   VIDEO ASSISTED THORACOSCOPY Left 02/08/2018   Procedure: [facility];  Surgeon: Melrose Nakayama, MD;  Location: [facility];  Service: Thoracic;  Laterality: Left;   VIDEO BRONCHOSCOPY N/A 02/08/2018   Procedure: VIDEO BRONCHOSCOPY;  Surgeon: Melrose Nakayama, MD;  Location: [facility];  Service: Thoracic;  Laterality: N/A;    Allergies  Allergen Reactions   Codeine Nausea Only    GI upset     Immunization History  Administered Date(s) Administered   Fluad Quad(high Dose 65+) 08/07/2019   Influenza, High Dose Seasonal PF 10/25/2017, 05/28/2020   Influenza-Unspecified 09/27/2013   PFIZER(Purple Top)SARS-COV-2 Vaccination 10/03/2019, 10/24/2019, 05/28/2020   Pneumococcal Conjugate-13 10/25/2017   Pneumococcal Polysaccharide-23 08/07/2019   Pneumococcal-Unspecified 08/07/2008   Zoster, Live 12/06/2013    Family History  Problem Relation Age of Onset   Emphysema Father    Asthma  Father    Congestive Heart Failure Father    Stroke Father    Asthma Sister    COPD Sister    Alzheimer's disease Mother      Current Outpatient Medications:    albuterol (VENTOLIN HFA) 108 (90 Base) MCG/ACT inhaler, Inhale into the lungs., Disp: , Rfl:    ALPRAZolam (XANAX) 0.5 MG tablet, TAKE 1/2 TO 1 TABLET BY MOUTH DAILY AS NEEDED FOR ANXIETY. Limit use. Do not mix with tramadol., Disp: , Rfl:    aspirin EC 81 MG tablet, Take 81 mg by mouth daily., Disp: , Rfl:    atorvastatin (LIPITOR) 10 MG tablet, TAKE ONE TABLET BY MOUTH DAILY, Disp: , Rfl:    budesonide-formoterol (SYMBICORT) 80-4.5 MCG/ACT inhaler, Inhale into the lungs., Disp: , Rfl:    calcium carbonate (TUMS EX) 750 MG chewable tablet, Chew 1 tablet by mouth daily as needed for heartburn., Disp: , Rfl:    clopidogrel (PLAVIX) 75 MG tablet, Take by mouth., Disp: , Rfl:    dextromethorphan-guaiFENesin (MUCINEX DM) 30-600 MG per 12 hr tablet, Take 1 tablet by mouth 2 (two) times daily as needed (congestion). , Disp: , Rfl:    dorzolamide-timolol (COSOPT) 22.3-6.8 MG/ML ophthalmic solution, , Disp: , Rfl:    DULoxetine (CYMBALTA) 60 MG capsule, Take 30 mg by mouth in the morning, at noon, and at bedtime., Disp: , Rfl:    latanoprost (XALATAN) 0.005 % ophthalmic solution, Place 1 drop into both eyes at bedtime., Disp: , Rfl:    losartan (COZAAR) 50 MG tablet, Take 50 mg by mouth daily. , Disp: ,  Rfl:       Objective:   Vitals:   05/26/21 1123  BP: 122/64  Pulse: 75  Temp: 98.2 F (36.8 C)  TempSrc: Oral  SpO2: 98%  Weight: 167 lb 12.8 oz (76.1 kg)  Height: '5\' 2"'$  (1.575 m)    Estimated body mass index is 30.69 kg/m as calculated from the following:   Height as of this encounter: '5\' 2"'$  (1.575 m).   Weight as of this encounter: 167 lb 12.8 oz (76.1 kg).  '@WEIGHTCHANGE'$ @  [employer]   05/26/21 1123  Weight: 167 lb 12.8 oz (76.1 kg)     Physical Exam  General: No distress.  Looks well Neuro: Alert and  Oriented x 3. GCS 15. Speech normal Psych: Pleasant Resp:  Barrel Chest - no.  Wheeze - no, Crackles - no, No overt respiratory distress CVS: Normal heart sounds. Murmurs - no Ext: Stigmata of Connective Tissue Disease - no HEENT: Normal upper airway. PEERL +. No post nasal drip        Assessment:       ICD-10-CM   1. ILD (interstitial lung disease) ([facility])  J84.9     2. 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[facility])  J84.89     3. Long term (current) use of systemic steroids  Z79.52          Plan:     Patient Instructions    ILD (interstitial lung disease) ([facility]) 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[facility])   - currently stable on CT x 2 yers on long term prednisone '10mg'$  per day  Plan - reduce prednisone '8mg'$  per day  - in 6 months  to spiro/dlco   Followup  - with Dr Chase Caller for 15 min visit   - symptom score and walk test at [facility] - return sooner if neede    SIGNATURE    Dr. Brand Males, M.D., F.C.C.P,  Pulmonary and Critical Care Medicine Staff Physician, [facility] Director - Interstitial Lung Disease  Program  [facility] [place], [place], [address]  Pager: [phone], If no answer or between  15:00h - 7:00h: call [phone] Telephone: [phone]  12:11 PM 05/26/2021

## 2021-08-31 ENCOUNTER — Other Ambulatory Visit: Payer: Self-pay | Admitting: Nurse Practitioner

## 2021-08-31 DIAGNOSIS — E2839 Other primary ovarian failure: Secondary | ICD-10-CM

## 2021-08-31 DIAGNOSIS — Z7952 Long term (current) use of systemic steroids: Secondary | ICD-10-CM

## 2022-06-07 ENCOUNTER — Other Ambulatory Visit: Payer: Self-pay

## 2022-06-07 DIAGNOSIS — J849 Interstitial pulmonary disease, unspecified: Secondary | ICD-10-CM

## 2022-06-08 ENCOUNTER — Ambulatory Visit: Payer: Medicare Other | Admitting: Internal Medicine

## 2022-07-28 ENCOUNTER — Other Ambulatory Visit: Payer: Self-pay | Admitting: Internal Medicine

## 2022-08-17 ENCOUNTER — Ambulatory Visit: Payer: Medicare Other | Admitting: Internal Medicine

## 2022-08-17 ENCOUNTER — Ambulatory Visit (INDEPENDENT_AMBULATORY_CARE_PROVIDER_SITE_OTHER): Payer: Medicare Other | Admitting: Internal Medicine

## 2022-08-17 VITALS — BP 126/72 | HR 71

## 2022-08-17 DIAGNOSIS — Z23 Encounter for immunization: Secondary | ICD-10-CM

## 2022-08-17 DIAGNOSIS — J8489 Other specified interstitial pulmonary diseases: Secondary | ICD-10-CM

## 2022-08-17 DIAGNOSIS — J849 Interstitial pulmonary disease, unspecified: Secondary | ICD-10-CM | POA: Diagnosis not present

## 2022-08-17 DIAGNOSIS — Z7185 Encounter for immunization safety counseling: Secondary | ICD-10-CM

## 2022-08-17 DIAGNOSIS — Z7952 Long term (current) use of systemic steroids: Secondary | ICD-10-CM

## 2022-08-17 LAB — PULMONARY FUNCTION TEST
DL/VA % pred: 99 %
DL/VA: 4.19 ml/min/mmHg/L
DLCO cor % pred: 73 %
DLCO cor: 13.41 ml/min/mmHg
DLCO unc % pred: 73 %
DLCO unc: 13.41 ml/min/mmHg
FEF 25-75 Pre: 2.4 L/sec
FEF2575-%Pred-Pre: 134 %
FEV1-%Pred-Pre: 83 %
FEV1-Pre: 1.74 L
FEV1FVC-%Pred-Pre: 114 %
FEV6-%Pred-Pre: 75 %
FEV6-Pre: 2 L
FEV6FVC-%Pred-Pre: 104 %
FVC-%Pred-Pre: 72 %
FVC-Pre: 2 L
Pre FEV1/FVC ratio: 87 %
Pre FEV6/FVC Ratio: 100 %

## 2022-08-17 MED ORDER — PREDNISONE 1 MG PO TABS: 1.0000 mg | ORAL_TABLET | Freq: Every day | ORAL | 3 refills | Status: DC

## 2022-08-17 NOTE — Patient Instructions (Signed)
Spirometry/DLCO performed today. 

## 2022-08-17 NOTE — Progress Notes (Signed)
Spirometry/DLCO performed today. 

## 2022-08-17 NOTE — Patient Instructions (Addendum)
ILD (interstitial lung disease) (HCC) 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[facility])   - currently stable  on PFT and walk test on '8mg'$  per day prednisone but symptoms worse - worsening symptioms could be due to back issues and weight gain but we do need to be careful about ILD progression   Plan - reduce prednisone from '8mg'$  per day to '7mg'$  per day Dec 2023 -> '6mg'$  per day in Jan 2024 and '5mg'$  per day in Feb 2024 and stay on that dose  - in 3 months  to spiro/dlco  - in 3 months do  HRCT supine and prone -high dose flu shot 08/17/2022 - RSV vaccine and covid mRNA vaccine on your own soon  Followup  - with Dr Chase Caller for 30 min visit in 3 months but after CT and PFT   - symptom score and walk test at [facility] - return sooner if needed

## 2022-08-17 NOTE — Progress Notes (Signed)
OV 11/01/2017 - ILD clinic   - transfer of care and 2nd opinion  Chief Complaint  Patient presents with   Advice Only    Referred by Dr. Melford Aase due to an abnormal CT.  Pt does have complaints of a dry cough and SOB with exertion or if has a coughing episode.    71 year old female referred by the primary care physician to the ILD clinic for evaluation of her pulmonary problems with symptoms of cough and shortness of breath.  According to the patient for a better part of 2 years she has had episodic cough particularly in the fall season.  She says she is active in the outdoors doing gardening and cutting and blowing and mowing and she usually wears a mask but nevertheless she will get a bronchitis episode that will require nebulizers and prednisone to resolve.  Symptoms will usually resolve over 2-3 weeks.  However this time around it is taken 8 weeks to resolve.  Last prednisone was over a week or 2 ago.  She still has some ongoing wheezing.  In the background of this episodic cough she says between episodes she does not have any cough but she does have some baseline shortness of breath when she climbs a flight of stairs this is been stable and is of insidious onset also present for 2 years.  She did have a CT scan of the chest approximately 9 months ago in May 2018 that shows in my personal opinion based upon my personal visualization bilateral subpleural reticulation particularly in the upper lobes and some subpleural reticulation in the left lower lobe.  There is no clear distinct craniocaudal gradient in my opinion this would be indeterminate for UIP without a clear alternate etiology.  Given the presence of ILD findings she has been sent to the ILD clinic.  Review of the chart also shows she is seen.my colleague Dr. Melvyn Novas for asthma symptoms and is been placed on Symbicort which she takes 1 puff twice daily.  The exam nitric oxide a week after prednisone and while on Symbicort is elevated to near  abnormal levels at 48 ppb today  [employer] of chest physicians interstitial lung disease questionnaire -Past medical history: Positive for allergies.  IgE was elevated to 168 approximately a year or 2 ago on chart review.  In addition she gives a history of rheumatoid arthritis diagnosed many many years ago.  Seen by Dr. Keturah Barre at that time.  She is only followed up with nonsteroidal anti-inflammatory drugs.  She has not followed up with Dr. Keturah Barre in many years.  She feels she needs to go back.  However in May 2017 her autoimmune limited profile done here was negative.  -Personal exposure history: She has smoked marijuana in the past.  She smokes cigarettes from age 51 to age 7 a pack a day and quit.  -Family history of lung disease: Sister [name] who lives in [place] apparently has had a lung biopsy and has been diagnosed with sarcoidosis recently.  She is also a smoker.  Father died of congestive heart failure.  There is no diagnosis of pulmonary fibrosis or hypersensitivity pneumonitis  -Home exposure history: Has not lived in a house that is old in the last 10 years.  There is no humidifier or insomnia or hot tub or Jacuzzi or water damage or mold.  She has 1 dog.  She does not have any birds.  She does not use any feathered pillows.  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 Range 02/03/2016 11:20  Anit Nuclear Antibody(ANA) Latest Ref Range: NEGATIVE  NEG  Cyclic Citrullin Peptide Ab Latest Units: Units <16  RA Latex Turbid. Latest Ref Range:  <=14 IU/mL <10    Walking desaturation test on 11/01/2017 185 feet x 3 laps on ROOM AIR:  did not desaturate. Rest pulse ox was 100%, final pulse ox was 97%. HR response 88/min at rest to 100/min at peak exertion. Patient Sharon [name]  Did not Desaturate < 88% . Sharon [name] yes did  Desaturated </= 3% points. Sharon [name] yes did get tachyardic   FeNO - 48ppb and almost abnormal   OV 12/13/2017  Chief Complaint  Patient presents with   Follow-up    PFT done today. Pt states after last visit on 11/01/17, she developed bronchitis and had it x2 weeks. Pt states she still has a mild cough. Denies any SOB or CP.   Follow-up interstitial lung disease with asthma/allergy phenotype  She returns for follow-up after investigations.  She presents with her daughter-in-law [name] who is well-known to me through working to medical ICU where she is a Equities trader.  We did a history read taken patient tells me that she moved from [place] to [place] many years ago.  She does regular gardening and works in the yard [employer].  Many times the leads are damp and there is she suspects mold in it.  She also burns the lesion is exposed to the smoke.  Symbicort is helping but approximately 2 weeks ago had respiratory exacerbation that was not related to gardening [in fact she has not gotten this whole year] and ended up in the ER treated with antibiotics and prednisone.  Currently back to baseline.  She had pulmonary function test today that shows mixed obstruction and restriction associated with flow volume loop abnormalities.  She had a hypersensitivity pneumonitis panel documented below that is negative.  She had limited autoimmune panel [the CMA at last visit did not order the full panel] and this was normal.  She had repeat CT scan of the chest read by thoracic radiology I personally visualized this and agree with the findings.  It is indeterminate for UIP and there is no specific alternate  pattern.  No  air trapping is described.  The differential diagnosis appears to be broad.     Results for Sharon [name], Sharon [name] (MRN [id number]) as of 12/13/2017 09:57  Ref. Range 11/01/2017 16:22  Faenia retivirgula Latest Ref Range: NEGATIVE  NEGATIVE  S. VIRIDIS Latest Ref Range: NEGATIVE  NEGATIVE  T. CANDIDUS Latest Ref Range: NEGATIVE  NEGATIVE  T. VULGARIS Latest Ref Range: NEGATIVE  NEGATIVE    Results for Sharon [name], Sharon [name] (MRN [id number]) as of 12/13/2017 09:57  Ref. Range 11/01/2017 16:22  Anit Nuclear Antibody(ANA) Latest Ref Range: NEGATIVE  NEGATIVE  Angiotensin-Converting Enzyme Latest Ref Range: 9 - 67 U/L 25  Cyclic Citrullin Peptide Ab Latest Units: UNITS <16  ds DNA Ab Latest Units: IU/mL <1  RA Latex Turbid. Latest Ref Range: <  14 IU/mL <14  IgE (Immunoglobulin E), Serum Latest Ref Range: <OR=114 kU/L 252 (H)    IMPRESSION: CT FEb 2019 1. Pulmonary parenchymal pattern of fibrotic interstitial lung disease is unchanged from 02/07/2017 and may be due to nonspecific interstitial pneumonitis or mild chronic hypersensitivity pneumonitis. Findings are not consistent with usual interstitial pneumonitis. 2. Aortic atherosclerosis (ICD10-170.0). Coronary artery calcification.     Electronically Signed   By: Lorin Picket M.D.   On: 11/14/2017 12:56   OV 02/23/2018  Chief Complaint  Patient presents with   Follow-up    Pt had lung biopsy done and states she is doing good since then. States only time she has pain is when she sneezes. Denies any current complaints of cough, SOB, or CP.    Here to review SLB results -slides have been read by our resident pathologist with whom I discussed via email.  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  In addition he did see some eosinophilic infiltrates in the interstitium.  This can explain the high nitric oxide that she has.  She is here with her husband.  Since surgery  she is doing well.  She only has some pain in her chest when she sneezes at the postsurgical site.  She still has one suture hanging out which Dr. Roxan Hockey said was okay to remove and my LPN removed it.  But overall he she and her husband are here to discuss the pathology report.  They want to know prognosis and implications.  Her grandchild is now 64 days old and she wants to spend a lot of time taking care of the grandchild.  She is aware of prednisone side effects.  Her husband says that her mood might change because of prednisone but patient herself says she has tolerated prednisone just fine in the past.     OV 03/23/2018  Chief Complaint  Patient presents with   Follow-up    PFT performed 6/24.    Pt states she has been doing well since last visit. States only complaint is fatigue in the afternoons.    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 Remot hx of RA Rx with NSAID but 2019 - autoimmune panel negative. Started prednisone 02/23/18   Ms. Acheampong is here to follow-up for her interstitial lung disease as above. She tells me that she is going to start 40 mg prednisone tomorrow. Meanwhile the prednisone has caused her to have 10 pound weight gain. Her clothes are barely fitting her. She is also having acidity and acid reflux for which she is not on treatment at this point. Otherwise overall she is tolerating things fine. There are no other new issues. She had pulmonary function test today and it shows 11% improvement in the postbronchodilator FVC. She feels her dyspnea has improved         OV 08/07/2019  Subjective:  Patient ID: Sharon [name], female , DOB: 05/19/1951 , age 59 y.o. 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 Remot hx of RA Rx with NSAID but 2019 - autoimmune panel negative. Started prednisone 02/23/18 08/07/2019 -   Chief Complaint   Patient presents with   Follow-up    Pt states she has been doing well since last visit and denies any complaints with breathing.     HPI Sharon [name] 71 y.o. -last seen in May 2019.  She saw our nurse practitioner in June 2020 and because she was doing well prednisone was tapered and stopped.  But this then resulted in worsening shortness of breath and chest tightness.  She visited the beach in the summer and she found it very difficult to even walk short distances.  This because of shortness of breath and relieved by rest.  Then in August 2020 she saw a nurse practitioner again and had pulmonary function test which is shown below.  It declined.  She went back on prednisone and is currently at 10 mg/day.  She feels back at baseline.  She says she does not have any organic antigen or mold exposure.  She has a Human resources officer.  There is no mold in the house.  No birds in the house.  Walking desaturation test today shows baseline normal performance.  However she tells me that when she climbs stairs she feels very short of breath.  She does have associated obesity. She wants to have flu and pneumovax   She is also complaining of chronic right infraaxillary right paralumbar region pain.  She says she has had previous multiple lipomas with excision.  She feels its tender to touch.  There is no urinary discomfort.  There is no fever or rash in the area.  This is a longstanding and intermittent.  There is no back pain.  After she left I noted that CT scan three-vessel coronary artery calcification.  I do not know if she has had a stress test.    HRCT Aug 2020  IMPRESSION: 1. There is a redemonstrated pattern of apical predominant mild pulmonary fibrosis featuring mild traction bronchiectasis, subpleural irregular ground-glass and occasional areas of bronchiolectasis with some elements of peribronchovascular ground-glass and fibrotic architectural distortion, particular in the left upper  lobe. There is evidence of interval left lung wedge biopsies. No evidence of air trapping on expiratory phase imaging. Findings are not significantly changed compared to prior examinations dating back to 02/07/2017 and remain in an "alternate diagnosis" pattern by ATS pulmonary fibrosis criteria, consistent with histologic diagnosis favoring NSIP.   2.  Stable, benign small pulmonary nodules.   3.  Coronary artery disease and aortic atherosclerosis.     Electronically Signed   By: Eddie Candle M.D.   On: 05/01/2019 13:25    IMPRESSION: 1. There is a redemonstrated pattern of apical predominant mild pulmonary fibrosis featuring mild traction bronchiectasis, subpleural irregular ground-glass and occasional areas of bronchiolectasis with some elements of peribronchovascular ground-glass and fibrotic architectural distortion, particular in the left upper lobe. There is evidence of interval left lung wedge biopsies. No evidence of air trapping on expiratory phase imaging. Findings are not significantly changed compared to prior examinations dating back to 02/07/2017 and remain in an "alternate diagnosis" pattern by ATS pulmonary fibrosis criteria, consistent with histologic diagnosis favoring NSIP.   2.  Stable, benign small pulmonary nodules.   3.  Coronary artery disease and aortic atherosclerosis.     Electronically Signed   By: Eddie Candle M.D.   On: 05/01/2019 13:25   ROS - per HPI     has a past medical history of Anemia, Chronic major depressive disorder, DDD (degenerative disc disease), cervical, Depression, Dyspnea, Hypertension, Interstitial lung disease ([place]), Mixed hyperlipidemia, Pain in joints of both feet, Pneumonia (2016), Polyarthropathy, Stroke [facility]), Vision abnormalities, and Vitamin D deficiency.    OV 02/14/2020  Subjective:  Patient ID: Sharon [name], female , DOB: May 30, 1951 , age 9 y.o. 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 Remot hx of RA Rx with NSAID but 2019 - autoimmune panel negative. Started prednisone 02/23/18 multidisciplinary ILD conference September 04, 2019 -NSIP as first diagnosis with hypersensitive pneumonitis is a second.  Treatment consideration is prednisone but if progresses then add antifibrotic.   -Course is overall of 1 of stability.  In the summer 2020 did get worse in the absence of prednisone but improved with prednisone.  #Normal stress test November 2020   02/14/2020 -   Chief Complaint  Patient presents with   Follow-up    SOB with walking, no coughing. doing well.      HPI Sharon [name] 71 y.o. -presents for ILD follow-up.  After the last visit we did discuss her again at the case conference in December 2020.  NSIP is considered the leading diagnosis.  She is doing well on prednisone 10 mg/day.  She says the dyspnea is stable.  Although the symptom score maybe it is a little bit worse walking desaturation test is stable.  Pulmonary function test is stable/improved.  She has had a Covid vaccine.  She prefers to take prednisone at 10 mg/day.  She does not want to taper.  She is having some back pain.  She says primary care physician Chesley Noon, MD is monitoring her bone health.  Her recent cardiac stress test was normal.  I reviewed the results.   Results for Sharon [name], Sharon [name] (MRN [id number]) as of 02/14/2020 10:07  Ref. Range 12/13/2017 09:03 03/20/2018 09:11 05/18/2018 11:20 05/11/2019 13:59 01/25/2020 15:06  FVC-Pre Latest Units: L 1.65 2.05 2.06 1.76 -worsae without pred 2.01   Results for Sharon [name], Sharon [name] (MRN [id number]) as of 02/14/2020 10:07  Ref. Range 12/13/2017 09:03 03/20/2018 09:11 05/18/2018 11:20 05/11/2019 13:59 01/25/2020 15:06  DLCO unc Latest Units: ml/min/mmHg 13.89   15.51 15.12  DLCO unc % pred Latest Units: % 66   84 82        OV 03/17/2021  Subjective:  Patient ID: Sharon [name], female , DOB: 1951-09-11 , age 71 y.o. , MRN: [id number] , ADDRESS: [place] [id number] PCP Chesley Noon, MD Patient Care Team: Chesley Noon, MD as PCP - General (Family Medicine)  This Provider for this visit: Treatment Team:  Attending Provider: Brand Males, MD    03/17/2021 -   Chief Complaint  Patient presents with   Follow-up    Pt states she was doing well up until 4 weeks ago. States 4 weeks ago she developed a head cold which then went in her chest and was prescribed abx and she states she is still having postnasal drainage and a cough.    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 Remot hx of RA Rx with NSAID but 2019 - autoimmune panel negative. Started prednisone 02/23/18 multidisciplinary ILD conference September 04, 2019 -NSIP as first diagnosis with hypersensitive pneumonitis is a second.  Treatment consideration is prednisone but if progresses then add antifibrotic.   -Course is overall of 1 of stability.  In the summer 2020 did get worse in the absence of prednisone but improved with prednisone.  #Normal stress test November 2020  HPI Sharon [name] 71 y.o. -last seen over a year ago.  She says she is doing really well from a ILD perspective and overall health is good.  Then approximately 1 month ago she feels  she picked up a head cold.  She is describing a sinus infection.  She was given erythromycin and then seem to get better but then a few weeks ago started having significant cough and mucus production.  But no change in shortness of breath.  PCP then gave her 12-day prednisone that ended yesterday.  She feels the cough is better but then she started having new onset of frequent sinus drainage that is green ears are plugged particularly the right ear [there is wax on the exam of the right ear].  Nevertheless she feels highly stable.  Primary care felt that she needed to be seen by pulmonary.  Her last  pulmonary function testing was a year ago and last CT scan was 2 years ago.   No results found.    PFT   OV 05/26/2021  Subjective:  Patient ID: Sharon [name], female , DOB: [birth year] , age 57 y.o. , MRN: [id number] , ADDRESS: [address] [place] [address] PCP Chesley Noon, MD Patient Care Team: Chesley Noon, MD as PCP - General (Family Medicine)  This Provider for this visit: Treatment Team:  Attending Provider: Brand Males, MD    05/26/2021 -   Chief Complaint  Patient presents with   Follow-up    Pt states she has been doing okay since last visit and denies any complaints.   0 H0PI Sharon [name] 71 y.o. -continues to do well.  Symptom score is stable between June 2022 and now.  She had high-resolution CT chest in August 2022 shows 2-year stability.  I discussed this result to her.  She was supposed to have pulmonary function test but she forgot to schedule that.  We do not have the data but she feels stable and the CT scan is stable.  She continues on prednisone 10 mg/day.  She is tolerating it well but we discussed about possible reduction in dose she is open to this idea.  We did notice that 2 years ago when she stopped prednisone she started doing poorly with lung function.  Therefore we feel there is a floor dose which she needs.  We agreed that we would slowly reduce the prednisone    CT Chest data Aug  2022  Narrative & Impression  CLINICAL DATA:  Interstitial lung disease.   EXAM: CT CHEST WITHOUT CONTRAST   TECHNIQUE: Multidetector CT imaging of the chest was performed following the standard protocol without intravenous contrast. High resolution imaging of the lungs, as well as inspiratory and expiratory imaging, was performed.   COMPARISON:  05/01/2019.   FINDINGS: Cardiovascular: Atherosclerotic calcification of the aorta and coronary arteries. Heart is at the upper limits of normal in size. No pericardial effusion.    Mediastinum/Nodes: No pathologically enlarged mediastinal or axillary lymph nodes. Hilar regions are difficult to definitively evaluate without IV contrast but appear grossly unremarkable. Esophagus is grossly unremarkable. Small hiatal hernia.   Lungs/Pleura: Upper and midlung zone predominant interstitial coarsening, traction bronchiectasis/bronchiolectasis, ground-glass and subpleural reticular densities, unchanged from 05/01/2019. Postoperative scarring in the left upper and left lower lobes. 4 mm peripheral right upper lobe nodule (9/48), unchanged and benign. There is air trapping. No pleural fluid. Airway is unremarkable.   Upper Abdomen: Low-attenuation lesions in the liver measure up to 1.6 cm and are likely cysts. Visualized portions of the liver, gallbladder, adrenal glands, kidneys, spleen, pancreas, stomach and bowel are otherwise unremarkable with the exception of a small hiatal hernia.   Musculoskeletal: No worrisome lytic  or sclerotic lesions.   IMPRESSION: 1. Pulmonary parenchymal pattern of fibrosis, as described above, unchanged from 05/01/2019 and compatible with biopsy-proven fibrotic nonspecific interstitial pneumonitis. Findings are suggestive of an alternative diagnosis (not UIP) per consensus guidelines: Diagnosis of Idiopathic Pulmonary Fibrosis: An Official ATS/ERS/JRS/ALAT Clinical Practice Guideline. [place], Iss 5, [phone], May 28 2017. 2. Aortic atherosclerosis (ICD10-I70.0). Coronary artery calcification.     Electronically Signed   By: Lorin Picket M.D.   On: 05/04/2021 13:58        OV 08/17/2022  Subjective:  Patient ID: Sharon [name], female , DOB: Feb 20, 1951 , age 3 y.o. , MRN: [id number] , ADDRESS: [place] [id number] PCP Chesley Noon, MD Patient Care Team: Chesley Noon, MD as PCP - General (Family Medicine)  This Provider for this visit: Treatment Team:  Attending  Provider: Brand Males, MD    08/17/2022 -   Chief Complaint  Patient presents with   Follow-up    PFT performed today.  Pt states she has been doing okay since last visit and denies any complaints.    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 Remot hx of RA Rx with NSAID but 2019 - autoimmune panel negative. Started prednisone 02/23/18 multidisciplinary ILD conference September 04, 2019 -NSIP as first diagnosis with hypersensitive pneumonitis is a second.  Treatment consideration is prednisone but if progresses then add antifibrotic.  -Prednisone lowered from 10 mg/day to 8 mg/day in August 2022.   -Course is overall of 1 of stability.  In the summer 2020 did get worse in the absence of prednisone but improved with prednisone.  #Normal stress test November 20200  HPI Sharon [name] 71 y.o. -returns for follow-up.  I personally saw her in August 2022.  She was supposed to see me back in 6 months but she has not.  She has been busy with her grandkids.  Her daughter-in-law [name] is expecting her fourth child.  [name] used to work in the ICU at [employer].  In any event she is now on prednisone 8 mg/day.  She has gained a lot of weight.  She does not attribute this to prednisone but being sedentary.  She says that in the summer 2023 she did end up with back muscle strain?  Gluteus maximtis s and was on 6-week prednisone taper but is now back to baseline of 8 mg/day.  During this time she also felt she had a lot of cough and respiratory exacerbation but it appears that this is resolved.  She continues on Symbicort.  She takes Mucinex.  With the weight gain she is feeling more short of breath.  In fact her symptoms.  Worse.  Stairs are difficult.  Walking desaturation test is the same.  However she did feel dyspneic walking the third lab which she never used in the past.      SYMPTOM SCALE - ILD 08/07/2019  02/14/2020  03/17/2021  05/26/2021   08/17/2022 167#  O2 use r      Shortness of Breath 0 -> 5 scale with 5 being worst (score 6 If unable to do)      At rest 0 0 0 0 0  Simple tasks - showers, clothes change, eating, shaving 0 0 0 0 1  Household (dishes, doing bed, laundry) '2 3 2 '$ 0 4  Shopping 1 2 0 0 4  Walking level at own pace 0 '2 2 3 '$ 4  Walking up Stairs '5 5 3 4 5  '$ Total (40 - 48) Dyspnea Score '8 12 7 7 18  '$ How bad is your cough? 0 0 2.5 0 0  How bad is your fatigue 0 0 '3 4 3  '$ nausea  0 0 0 0  vomit  0 0 0 0  diarhea  00 0 0 0  anzity  0 0 0 0  depression  '3 3 2 3       '$ Simple office walk 185 feet x  3 laps goal with forehead probe 02/23/2018  08/07/2019  02/14/2020  03/17/2021  08/17/2022   O2 used Room air Room air Room air ra ra  Number laps completed '3 3 3 3 3  '$ Comments about pace slow Normal pace Nl pace avg   Resting Pulse Ox/HR 100% and 71/min 100% and 68 100% and 84/min 100% and 78 100% and HR 71  Final Pulse Ox/HR 99% and 89/min 99% and 87/min 100% and 96/min 99% and 97 99% and HR 104  Desaturated </= 88% no no no no no  Desaturated <= 3% points no no no no no  Got Tachycardic >/= 90/min no no yes yes yes  Symptoms at end of test Mild dyspnea No complaints No complaints No complaints Mild dyspnea  Miscellaneous comments x        PFT     Latest Ref Rng & Units 08/17/2022    9:16 AM 01/25/2020    3:06 PM 05/11/2019    1:59 PM 05/18/2018   11:20 AM 03/20/2018    9:11 AM 12/13/2017    9:03 AM  PFT Results  FVC-Pre L 2.00  P 2.01  1.76  2.06  2.05  1.65   FVC-Predicted Pre % 72  P 71  61  71  71  58   FVC-Post L     2.16  1.94   FVC-Predicted Post %     75  68   Pre FEV1/FVC % % 87  P 83  82  89  88  85   Post FEV1/FCV % %     92  91   FEV1-Pre L 1.74  P 1.67  1.44  1.83  1.80  1.39   FEV1-Predicted Pre % 83  P 78  66  83  82  64   FEV1-Post L     1.99  1.76   DLCO uncorrected ml/min/mmHg 13.41  P 15.12  15.51    13.89   DLCO UNC% % 73  P 82  84    66   DLCO corrected ml/min/mmHg 13.41  P  15.12     14.35   DLCO COR %Predicted % 73  P 82     68   DLVA Predicted % 99  P 108  115    106     P Preliminary result       has a past medical history of Anemia, Chronic major depressive disorder, DDD (degenerative disc disease), cervical, Depression, Dyspnea, Hypertension, Interstitial lung disease ([place]), Mixed hyperlipidemia, Pain in joints of both feet, Pneumonia (2016), Polyarthropathy, Stroke ([place]), Vision abnormalities, and Vitamin D deficiency.   reports that she quit smoking about 32 years ago. Her smoking use included cigarettes. She started smoking about 52 years ago. She has a 35.00 pack-year smoking history. She has never used smokeless tobacco.  Past Surgical History:  Procedure Laterality Date   CARPAL TUNNEL RELEASE     CESAREAN SECTION  x2   COLONOSCOPY     LUNG BIOPSY Left 02/08/2018   Procedure: LUNG BIOPSY;  Surgeon: Melrose Nakayama, MD;  Location: [facility];  Service: Thoracic;  Laterality: Left;   right arm tendon surgery     TAYLOR BUNIONECTOMY     2 on one foot and 1 on the other foot   VIDEO ASSISTED THORACOSCOPY Left 02/08/2018   Procedure: [place];  Surgeon: Melrose Nakayama, MD;  Location: [facility];  Service: Thoracic;  Laterality: Left;   VIDEO BRONCHOSCOPY N/A 02/08/2018   Procedure: VIDEO BRONCHOSCOPY;  Surgeon: Melrose Nakayama, MD;  Location: [facility];  Service: Thoracic;  Laterality: N/A;    Allergies  Allergen Reactions   Codeine Nausea Only    GI upset     Immunization History  Administered Date(s) Administered   Fluad Quad(high Dose 65+) 08/07/2019, 07/14/2021, 08/17/2022   Influenza, High Dose Seasonal PF 10/25/2017, 05/28/2020   Influenza-Unspecified 09/27/2013   PFIZER(Purple Top)SARS-COV-2 Vaccination 10/03/2019, 10/24/2019, 05/28/2020, 07/14/2021   Pneumococcal Conjugate-13 10/25/2017   Pneumococcal Polysaccharide-23 08/07/2019   Pneumococcal-Unspecified 08/07/2008   Zoster, Live 12/06/2013     Family History  Problem Relation Age of Onset   Emphysema Father    Asthma Father    Congestive Heart Failure Father    Stroke Father    Asthma Sister    COPD Sister    Alzheimer's disease Mother      Current Outpatient Medications:    albuterol (VENTOLIN HFA) 108 (90 Base) MCG/ACT inhaler, Inhale into the lungs., Disp: , Rfl:    ALPRAZolam (XANAX) 0.5 MG tablet, TAKE 1/2 TO 1 TABLET BY MOUTH DAILY AS NEEDED FOR ANXIETY. Limit use. Do not mix with tramadol., Disp: , Rfl:    aspirin EC 81 MG tablet, Take 81 mg by mouth daily., Disp: , Rfl:    atorvastatin (LIPITOR) 10 MG tablet, TAKE ONE TABLET BY MOUTH DAILY, Disp: , Rfl:    brimonidine (ALPHAGAN) 0.2 % ophthalmic solution, SMARTSIG:In Eye(s), Disp: , Rfl:    budesonide-formoterol (SYMBICORT) 80-4.5 MCG/ACT inhaler, Inhale into the lungs., Disp: , Rfl:    calcium carbonate (TUMS EX) 750 MG chewable tablet, Chew 1 tablet by mouth daily as needed for heartburn., Disp: , Rfl:    clopidogrel (PLAVIX) 75 MG tablet, Take by mouth., Disp: , Rfl:    dextromethorphan-guaiFENesin (MUCINEX DM) 30-600 MG per 12 hr tablet, Take 1 tablet by mouth 2 (two) times daily as needed (congestion). , Disp: , Rfl:    dorzolamide-timolol (COSOPT) 22.3-6.8 MG/ML ophthalmic solution, , Disp: , Rfl:    DULoxetine (CYMBALTA) 60 MG capsule, Take 30 mg by mouth in the morning, at noon, and at bedtime., Disp: , Rfl:    latanoprost (XALATAN) 0.005 % ophthalmic solution, Place 1 drop into both eyes at bedtime., Disp: , Rfl:    losartan (COZAAR) 50 MG tablet, Take 50 mg by mouth daily. , Disp: , Rfl:    predniSONE (DELTASONE) 5 MG tablet, Take 1 tablet (5 mg total) by mouth daily with breakfast., Disp: 90 tablet, Rfl: 3   predniSONE (DELTASONE) 1 MG tablet, Take 1 tablet (1 mg total) by mouth daily with breakfast., Disp: 270 tablet, Rfl: 3      Objective:   Vitals:   08/17/22 1006  BP: 126/72  Pulse: 71  SpO2: 100%    Estimated body mass index is 30.69  kg/m as calculated from the following:   Height as of 05/26/21: '5\' 2"'$  (1.575 m).   Weight as  of 05/26/21: 167 lb 12.8 oz (76.1 kg).  '@WEIGHTCHANGE'$ @  There were no vitals filed for this visit.   Physical Exam General: No distress. Looks  well Neuro: Alert and Oriented x 3. GCS 15. Speech normal Psych: Pleasant Resp:  Barrel Chest - no.  Wheeze - no, Crackles - mild base, No overt respiratory distress CVS: Normal heart sounds. Murmurs - no Ext: Stigmata of Connective Tissue Disease - no HEENT: Normal upper airway. PEERL +. No post nasal drip        Assessment:       ICD-10-CM   1. ILD (interstitial lung disease) ([place])  J84.9 Pulmonary function test    CT Chest High Resolution    2. 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[place])  J84.89     3. Long term (current) use of systemic steroids  Z79.52     4. Flu vaccine need  Z23     5. Vaccine counseling  Z71.85     6. Need for immunization against influenza  Z23 Flu Vaccine QUAD High Dose(Fluad)         Plan:     Patient Instructions  ILD (interstitial lung disease) ([place]) 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[place])   - currently stable  on PFT and walk test on '8mg'$  per day prednisone but symptoms worse - worsening symptioms could be due to back issues and weight gain but we do need to be careful about ILD progression   Plan - reduce prednisone from '8mg'$  per day to '7mg'$  per day Dec 2023 -> '6mg'$  per day in Jan 2024 and '5mg'$  per day in Feb 2024 and stay on that dose  - in 3 months  to spiro/dlco  - in 3 months do  HRCT supine and prone -high dose flu shot 08/17/2022 - RSV vaccine and covid mRNA vaccine on your own soon  Followup  - with Dr Chase Caller for 30 min visit in 3 months but after CT and PFT   - symptom score and walk test at [facility] - return sooner if needed       SIGNATURE    Dr. Brand Males, M.D., F.C.C.P,  Pulmonary and Critical Care Medicine Staff Physician, [place]  Director - Interstitial Lung Disease  Program  [facility] [place] [facility] [place], [place], [address]  Pager: [phone], If no answer or between  15:00h - 7:00h: call [phone] Telephone: [phone]  1:24 PM 08/17/2022

## 2022-11-02 ENCOUNTER — Ambulatory Visit
Admission: RE | Admit: 2022-11-02 | Discharge: 2022-11-02 | Disposition: A | Discharge status: Home / Self Care | Payer: Medicare Other | Source: Ambulatory Visit | Attending: Internal Medicine | Admitting: Internal Medicine

## 2022-11-02 DIAGNOSIS — J849 Interstitial pulmonary disease, unspecified: Secondary | ICD-10-CM

## 2022-11-18 ENCOUNTER — Ambulatory Visit: Payer: Medicare Other | Admitting: Internal Medicine

## 2022-11-22 NOTE — Progress Notes (Signed)
No progression in pulmonary fibrosis. Will see you in march after PFT

## 2022-12-10 ENCOUNTER — Ambulatory Visit (INDEPENDENT_AMBULATORY_CARE_PROVIDER_SITE_OTHER): Payer: Medicare Other | Admitting: Internal Medicine

## 2022-12-10 ENCOUNTER — Other Ambulatory Visit: Payer: Self-pay

## 2022-12-10 ENCOUNTER — Other Ambulatory Visit (HOSPITAL_BASED_OUTPATIENT_CLINIC_OR_DEPARTMENT_OTHER): Payer: Self-pay

## 2022-12-10 DIAGNOSIS — J849 Interstitial pulmonary disease, unspecified: Secondary | ICD-10-CM | POA: Diagnosis not present

## 2022-12-10 LAB — PULMONARY FUNCTION TEST
DL/VA % pred: 101 %
DL/VA: 4.25 ml/min/mmHg/L
DLCO cor % pred: 76 %
DLCO cor: 13.91 ml/min/mmHg
DLCO unc % pred: 76 %
DLCO unc: 13.91 ml/min/mmHg
FEF 25-75 Pre: 1.76 L/sec
FEF2575-%Pred-Pre: 98 %
FEV1-%Pred-Pre: 79 %
FEV1-Pre: 1.66 L
FEV1FVC-%Pred-Pre: 109 %
FEV6-%Pred-Pre: 75 %
FEV6-Pre: 1.99 L
FEV6FVC-%Pred-Pre: 104 %
FVC-%Pred-Pre: 72 %
FVC-Pre: 2 L
Pre FEV1/FVC ratio: 83 %
Pre FEV6/FVC Ratio: 100 %

## 2022-12-10 NOTE — Patient Instructions (Signed)
Spiro/DLCO performed today.  

## 2022-12-10 NOTE — Progress Notes (Signed)
Spro/ DLCO performed today.

## 2022-12-14 ENCOUNTER — Encounter: Payer: Self-pay | Admitting: Internal Medicine

## 2022-12-14 ENCOUNTER — Ambulatory Visit: Payer: Medicare Other | Admitting: Internal Medicine

## 2022-12-14 VITALS — BP 126/76 | HR 93 | Temp 98.3°F | Ht 62.0 in | Wt 166.8 lb

## 2022-12-14 DIAGNOSIS — J849 Interstitial pulmonary disease, unspecified: Secondary | ICD-10-CM | POA: Diagnosis not present

## 2022-12-14 DIAGNOSIS — Z7952 Long term (current) use of systemic steroids: Secondary | ICD-10-CM

## 2022-12-14 DIAGNOSIS — J8489 Other specified interstitial pulmonary diseases: Secondary | ICD-10-CM | POA: Diagnosis not present

## 2022-12-14 MED ORDER — PREDNISONE 1 MG PO TABS: 4.0000 mg | ORAL_TABLET | Freq: Every day | ORAL | 3 refills | Status: DC

## 2022-12-14 NOTE — Addendum Note (Signed)
Addended by: Loma Sousa on: 12/14/2022 04:11 PM   Modules accepted: Orders

## 2022-12-14 NOTE — Patient Instructions (Addendum)
ILD (interstitial lung disease) ([facility]) 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[facility])   - currently stable  on symptoms, PFT and CT chest of 5mg  per day of prednisone - there is risk for flare up of ILD (Similar to August 2020) without prednisone and also steroid withdrawal   Plan - resume prednisone but we can stay at 4mg  per day and continue at this dose - do spirometry and dlco in 3-4 months  Followup  - with Dr Chase Caller for 30 min visit in 3-4 months but after PFT   - symptom score and walk test at [facility] - return sooner if needed

## 2022-12-14 NOTE — Progress Notes (Signed)
OV 11/01/2017 - ILD clinic   - transfer of care and 2nd opinion  Chief Complaint  Patient presents with   Advice Only    Referred by Dr. Melford [name] due to an abnormal CT.  Pt does have complaints of a dry cough and SOB with exertion or if has a coughing episode.    72 year old female referred by the primary care physician to the ILD clinic for evaluation of her pulmonary problems with symptoms of cough and shortness of breath.  According to the patient for a better part of 2 years she has had episodic cough particularly in the fall season.  She says she is active in the outdoors doing gardening and cutting and blowing and mowing and she usually wears a mask but nevertheless she will get a bronchitis episode that will require nebulizers and prednisone to resolve.  Symptoms will usually resolve over 2-3 weeks.  However this time around it is taken 8 weeks to resolve.  Last prednisone was over a week or 2 ago.  She still has some ongoing wheezing.  In the background of this episodic cough she says between episodes she does not have any cough but she does have some baseline shortness of breath when she climbs a flight of stairs this is been stable and is of insidious onset also present for 2 years.  She did have a CT scan of the chest approximately 9 months ago in May 2018 that shows in my personal opinion based upon my personal visualization bilateral subpleural reticulation particularly in the upper lobes and some subpleural reticulation in the left lower lobe.  There is no clear distinct craniocaudal gradient in my opinion this would be indeterminate for UIP without a clear alternate etiology.  Given the presence of ILD findings she has been sent to the ILD clinic.  Review of the chart also shows she is seen.my colleague Dr. Melvyn [name] for asthma symptoms and is been placed on Symbicort which she takes 1 puff twice daily.  The exam nitric oxide a week after prednisone and while on Symbicort is elevated to near  abnormal levels at 48 ppb today  [employer] of chest physicians interstitial lung disease questionnaire -Past medical history: Positive for allergies.  IgE was elevated to 168 approximately a year or 2 ago on chart review.  In addition she gives a history of rheumatoid arthritis diagnosed many many years ago.  Seen by Dr. Keturah [name] at that time.  She is only followed up with nonsteroidal anti-inflammatory drugs.  She has not followed up with Dr. Keturah [name] in many years.  She feels she needs to go back.  However in May 2017 her autoimmune limited profile done here was negative.  -Personal exposure history: She has smoked marijuana in the past.  She smokes cigarettes from age 74 to age 39 a pack a day and quit.  -Family history of lung disease: Sister Sharon [name] who lives in [place] apparently has had a lung biopsy and has been diagnosed with sarcoidosis recently.  She is also a smoker.  Father died of congestive heart failure.  There is no diagnosis of pulmonary fibrosis or hypersensitivity pneumonitis  -Home exposure history: Has not lived in a house that is old in the last 10 years.  There is no humidifier or insomnia or hot tub or Jacuzzi or water damage or mold.  She has 1 dog.  She does not have any birds.  She does not use any feathered pillows.  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 Range 02/03/2016 11:20  Anit Nuclear Antibody(ANA) Latest Ref Range: NEGATIVE  NEG  Cyclic Citrullin Peptide Ab Latest Units: Units <16  RA Latex Turbid. Latest Ref Range:  <=14 IU/mL <10    Walking desaturation test on 11/01/2017 185 feet x 3 laps on ROOM AIR:  did not desaturate. Rest pulse ox was 100%, final pulse ox was 97%. HR response 88/min at rest to 100/min at peak exertion. Patient Sharon [name]  Did not Desaturate < 88% . Sharon [name] yes did  Desaturated </= 3% points. Sharon [name] yes did get tachyardic   FeNO - 48ppb and almost abnormal   OV 12/13/2017  Chief Complaint  Patient presents with   Follow-up    PFT done today. Pt states after last visit on 11/01/17, she developed bronchitis and had it x2 weeks. Pt states she still has a mild cough. Denies any SOB or CP.   Follow-up interstitial lung disease with asthma/allergy phenotype  She returns for follow-up after investigations.  [name] who is well-known to me through working to medical ICU where she is a Equities trader.  We did a history read taken patient tells me that she moved from [place] to [place] many years ago.  She does regular gardening and works in the yard Sharon [name].  Many times the leads are damp and there is she suspects mold in it.  She also burns the lesion is exposed to the smoke.  Symbicort is helping but approximately 2 weeks ago had respiratory exacerbation that was not related to gardening [in fact she has not gotten this whole year] and ended up in the ER treated with antibiotics and prednisone.  Currently back to baseline.  She had pulmonary function test today that shows mixed obstruction and restriction associated with flow volume loop abnormalities.  She had a hypersensitivity pneumonitis panel documented below that is negative.  She had limited autoimmune panel [the CMA at last visit did not order the full panel] and this was normal.  She had repeat CT scan of the chest read by thoracic radiology I personally visualized this and agree with the findings.  It is indeterminate for UIP and there is no specific alternate  pattern.  No  air trapping is described.  The differential diagnosis appears to be broad.     Results for Sharon, DEUTSCH (MRN [id number]) as of 12/13/2017 09:57  Ref. Range 11/01/2017 16:22  Faenia retivirgula Latest Ref Range: NEGATIVE  NEGATIVE  S. VIRIDIS Latest Ref Range: NEGATIVE  NEGATIVE  T. CANDIDUS Latest Ref Range: NEGATIVE  NEGATIVE  T. VULGARIS Latest Ref Range: NEGATIVE  NEGATIVE    Results for THAO, MANSELL (MRN [id number]) as of 12/13/2017 09:57  Ref. Range 11/01/2017 16:22  Anit Nuclear Antibody(ANA) Latest Ref Range: NEGATIVE  NEGATIVE  Angiotensin-Converting Enzyme Latest Ref Range: 9 - 67 U/L 25  Cyclic Citrullin Peptide Ab Latest Units: UNITS <16  ds DNA Ab Latest Units: IU/mL <1  RA Latex Turbid. Latest Ref Range: <14 IU/mL <  14  IgE (Immunoglobulin E), Serum Latest Ref Range: <OR=114 kU/L 252 (H)    IMPRESSION: CT FEb 2019 1. Pulmonary parenchymal pattern of fibrotic interstitial lung disease is unchanged from 02/07/2017 and may be due to nonspecific interstitial pneumonitis or mild chronic hypersensitivity pneumonitis. Findings are not consistent with usual interstitial pneumonitis. 2. Aortic atherosclerosis (ICD10-170.0). Coronary artery calcification.     Electronically Signed   By: Lorin Picket M.[name].   On: 11/14/2017 12:56   OV 02/23/2018  Chief Complaint  Patient presents with   Follow-up    Pt had lung biopsy done and states she is doing good since then. States only time she has pain is when she sneezes. Denies any current complaints of cough, SOB, or CP.    Here to review SLB results -slides have been read by our resident pathologist with whom I discussed via email.  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  In addition he did see some eosinophilic infiltrates in the interstitium.  This can explain the high nitric oxide that she has.  She is here with her husband.  Since surgery  she is doing well.  She only has some pain in her chest when she sneezes at the postsurgical site.  She still has one suture hanging out which Dr. Roxan Hockey said was okay to remove and my LPN removed it.  But overall he she and her husband are here to discuss the pathology report.  They want to know prognosis and implications.  Her grandchild is now 50 days old and she wants to spend a lot of time taking care of the grandchild.  She is aware of prednisone side effects.  Her husband says that her mood might change because of prednisone but patient herself says she has tolerated prednisone just fine in the past.     OV 03/23/2018  Chief Complaint  Patient presents with   Follow-up    PFT performed 6/24.    Pt states she has been doing well since last visit. States only complaint is fatigue in the afternoons.    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 Remot hx of RA Rx with NSAID but 2019 - autoimmune panel negative. Started prednisone 02/23/18   Ms. Teachey is here to follow-up for her interstitial lung disease as above. She tells me that she is going to start 40 mg prednisone tomorrow. Meanwhile the prednisone has caused her to have 10 pound weight gain. Her clothes are barely fitting her. She is also having acidity and acid reflux for which she is not on treatment at this point. Otherwise overall she is tolerating things fine. There are no other new issues. She had pulmonary function test today and it shows 11% improvement in the postbronchodilator FVC. She feels her dyspnea has improved         OV 08/07/2019  Subjective:  Patient ID: Sharon [name], female , DOB: 1951/03/06 , age 38 y.o. 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 Remot hx of RA Rx with NSAID but 2019 - autoimmune panel negative. Started prednisone 02/23/18 08/07/2019 -   Chief Complaint   Patient presents with   Follow-up    Pt states she has been doing well since last visit and denies any complaints with breathing.     Sharon [name] 72 y.o. -last seen in May 2019.  She saw  our nurse practitioner in June 2020 and because she was doing well prednisone was tapered and stopped.  But this then resulted in worsening shortness of breath and chest tightness.  She visited the beach in the summer and she found it very difficult to even walk short distances.  This because of shortness of breath and relieved by rest.  Then in August 2020 she saw a nurse practitioner again and had pulmonary function test which is shown below.  It declined.  She went back on prednisone and is currently at 10 mg/day.  She feels back at baseline.  She says she does not have any organic antigen or mold exposure.  She has a Human resources officer.  There is no mold in the house.  No birds in the house.  Walking desaturation test today shows baseline normal performance.  However she tells me that when she climbs stairs she feels very short of breath.  She does have associated obesity. She wants to have flu and pneumovax   She is also complaining of chronic right infraaxillary right paralumbar region pain.  She says she has had previous multiple lipomas with excision.  She feels its tender to touch.  There is no urinary discomfort.  There is no fever or rash in the area.  This is a longstanding and intermittent.  There is no back pain.  After she left I noted that CT scan three-vessel coronary artery calcification.  I do not know if she has had a stress test.    HRCT Aug 2020  IMPRESSION: 1. There is a redemonstrated pattern of apical predominant mild pulmonary fibrosis featuring mild traction bronchiectasis, subpleural irregular ground-glass and occasional areas of bronchiolectasis with some elements of peribronchovascular ground-glass and fibrotic architectural distortion, particular in the left upper  lobe. There is evidence of interval left lung wedge biopsies. No evidence of air trapping on expiratory phase imaging. Findings are not significantly changed compared to prior examinations dating back to 02/07/2017 and remain in an "alternate diagnosis" pattern by ATS pulmonary fibrosis criteria, consistent with histologic diagnosis favoring NSIP.   2.  Stable, benign small pulmonary nodules.   3.  Coronary artery disease and aortic atherosclerosis.     Electronically Signed   By: Eddie Candle M.[name].   On: 05/01/2019 13:25    IMPRESSION: 1. There is a redemonstrated pattern of apical predominant mild pulmonary fibrosis featuring mild traction bronchiectasis, subpleural irregular ground-glass and occasional areas of bronchiolectasis with some elements of peribronchovascular ground-glass and fibrotic architectural distortion, particular in the left upper lobe. There is evidence of interval left lung wedge biopsies. No evidence of air trapping on expiratory phase imaging. Findings are not significantly changed compared to prior examinations dating back to 02/07/2017 and remain in an "alternate diagnosis" pattern by ATS pulmonary fibrosis criteria, consistent with histologic diagnosis favoring NSIP.   2.  Stable, benign small pulmonary nodules.   3.  Coronary artery disease and aortic atherosclerosis.     Electronically Signed   By: Eddie Candle M.[name].   On: 05/01/2019 13:25   ROS - per Sharon     has a past medical history of Anemia, Chronic major depressive disorder, DDD (degenerative disc disease), cervical, Depression, Dyspnea, Hypertension, Interstitial lung disease ([place]), Mixed hyperlipidemia, Pain in joints of both feet, Pneumonia (2016), Polyarthropathy, Stroke [facility]), Vision abnormalities, and Vitamin [name] deficiency.    OV 02/14/2020  Subjective:  Patient ID: Sharon [name], female , DOB: Mar 11, 1951 , age 63 y.o. 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 Remot hx of RA Rx with NSAID but 2019 - autoimmune panel negative. Started prednisone 02/23/18 multidisciplinary ILD conference September 04, 2019 -NSIP as first diagnosis with hypersensitive pneumonitis is a second.  Treatment consideration is prednisone but if progresses then add antifibrotic.   -Course is overall of 1 of stability.  In the summer 2020 did get worse in the absence of prednisone but improved with prednisone.  #Normal stress test November 2020   02/14/2020 -   Chief Complaint  Patient presents with   Follow-up    SOB with walking, no coughing. doing well.      Sharon Sharon [name] 72 y.o. -presents for ILD follow-up.  After the last visit we did discuss her again at the case conference in December 2020.  NSIP is considered the leading diagnosis.  She is doing well on prednisone 10 mg/day.  She says the dyspnea is stable.  Although the symptom score maybe it is a little bit worse walking desaturation test is stable.  Pulmonary function test is stable/improved.  She has had a Covid vaccine.  She prefers to take prednisone at 10 mg/day.  She does not want to taper.  She is having some back pain.  She says primary care physician Chesley Noon, MD is monitoring her bone health.  Her recent cardiac stress test was normal.  I reviewed the results.   Results for JUAN, BERNSTEIN (MRN [id number]) as of 02/14/2020 10:07  Ref. Range 12/13/2017 09:03 03/20/2018 09:11 05/18/2018 11:20 05/11/2019 13:59 01/25/2020 15:06  FVC-Pre Latest Units: L 1.65 2.05 2.06 1.76 -worsae without pred 2.01   Results for VARVARA, WALLACE (MRN [id number]) as of 02/14/2020 10:07  Ref. Range 12/13/2017 09:03 03/20/2018 09:11 05/18/2018 11:20 05/11/2019 13:59 01/25/2020 15:06  DLCO unc Latest Units: ml/min/mmHg 13.89   15.51 15.12  DLCO unc % pred Latest Units: % 66   84 82        OV 03/17/2021  Subjective:  Patient ID: Sharon [name], female , DOB: [birth year] , age 60 y.o. , MRN: [id number] , ADDRESS: [place] [address] PCP Chesley Noon, MD Patient Care Team: Chesley Noon, MD as PCP - General (Family Medicine)  This Provider for this visit: Treatment Team:  Attending Provider: Brand Males, MD    03/17/2021 -   Chief Complaint  Patient presents with   Follow-up    Pt states she was doing well up until 4 weeks ago. States 4 weeks ago she developed a head cold which then went in her chest and was prescribed abx and she states she is still having postnasal drainage and a cough.    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 Remot hx of RA Rx with NSAID but 2019 - autoimmune panel negative. Started prednisone 02/23/18 multidisciplinary ILD conference September 04, 2019 -NSIP as first diagnosis with hypersensitive pneumonitis is a second.  Treatment consideration is prednisone but if progresses then add antifibrotic.   -Course is overall of 1 of stability.  In the summer 2020 did get worse in the absence of prednisone but improved with prednisone.  #Normal stress test November 2020  Sharon Sharon [name] 72 y.o. -last seen over a year ago.  She says she is doing really well from a ILD perspective and overall health is good.  Then approximately 1 month ago she feels she picked  up a head cold.  She is describing a sinus infection.  She was given erythromycin and then seem to get better but then a few weeks ago started having significant cough and mucus production.  But no change in shortness of breath.  PCP then gave her 12-day prednisone that ended yesterday.  She feels the cough is better but then she started having new onset of frequent sinus drainage that is green ears are plugged particularly the right ear [there is wax on the exam of the right ear].  Nevertheless she feels highly stable.  Primary care felt that she needed to be seen by pulmonary.  Her last  pulmonary function testing was a year ago and last CT scan was 2 years ago.   No results found.    PFT   OV 05/26/2021  Subjective:  Patient ID: Sharon [name], female , DOB: [birth year] , age 26 y.o. , MRN: [id number] , ADDRESS: [address] [place] [address] PCP Chesley Noon, MD Patient Care Team: Chesley Noon, MD as PCP - General (Family Medicine)  This Provider for this visit: Treatment Team:  Attending Provider: Brand Males, MD    05/26/2021 -   Chief Complaint  Patient presents with   Follow-up    Pt states she has been doing okay since last visit and denies any complaints.   0 H0PI Sharon [name] 72 y.o. -continues to do well.  Symptom score is stable between June 2022 and now.  She had high-resolution CT chest in August 2022 shows 2-year stability.  I discussed this result to her.  She was supposed to have pulmonary function test but she forgot to schedule that.  We do not have the data but she feels stable and the CT scan is stable.  She continues on prednisone 10 mg/day.  She is tolerating it well but we discussed about possible reduction in dose she is open to this idea.  We did notice that 2 years ago when she stopped prednisone she started doing poorly with lung function.  Therefore we feel there is a floor dose which she needs.  We agreed that we would slowly reduce the prednisone    CT Chest data Aug  2022  Narrative & Impression  CLINICAL DATA:  Interstitial lung disease.   EXAM: CT CHEST WITHOUT CONTRAST   TECHNIQUE: Multidetector CT imaging of the chest was performed following the standard protocol without intravenous contrast. High resolution imaging of the lungs, as well as inspiratory and expiratory imaging, was performed.   COMPARISON:  05/01/2019.   FINDINGS: Cardiovascular: Atherosclerotic calcification of the aorta and coronary arteries. Heart is at the upper limits of normal in size. No pericardial effusion.    Mediastinum/Nodes: No pathologically enlarged mediastinal or axillary lymph nodes. Hilar regions are difficult to definitively evaluate without IV contrast but appear grossly unremarkable. Esophagus is grossly unremarkable. Small hiatal hernia.   Lungs/Pleura: Upper and midlung zone predominant interstitial coarsening, traction bronchiectasis/bronchiolectasis, ground-glass and subpleural reticular densities, unchanged from 05/01/2019. Postoperative scarring in the left upper and left lower lobes. 4 mm peripheral right upper lobe nodule (9/48), unchanged and benign. There is air trapping. No pleural fluid. Airway is unremarkable.   Upper Abdomen: Low-attenuation lesions in the liver measure up to 1.6 cm and are likely cysts. Visualized portions of the liver, gallbladder, adrenal glands, kidneys, spleen, pancreas, stomach and bowel are otherwise unremarkable with the exception of a small hiatal hernia.   Musculoskeletal: No worrisome lytic or sclerotic  lesions.   IMPRESSION: 1. Pulmonary parenchymal pattern of fibrosis, as described above, unchanged from 05/01/2019 and compatible with biopsy-proven fibrotic nonspecific interstitial pneumonitis. Findings are suggestive of an alternative diagnosis (not UIP) per consensus guidelines: Diagnosis of Idiopathic Pulmonary Fibrosis: An Official ATS/ERS/JRS/ALAT Clinical Practice Guideline. [place], Iss 5, [phone], May 28 2017. 2. Aortic atherosclerosis (ICD10-I70.0). Coronary artery calcification.     Electronically Signed   By: Lorin Picket M.[name].   On: 05/04/2021 13:58        OV 08/17/2022  Subjective:  Patient ID: Sharon [name], female , DOB: 08-25-1951 , age 16 y.o. , MRN: [id number] , ADDRESS: [place] [id number] PCP Chesley Noon, MD Patient Care Team: Chesley Noon, MD as PCP - General (Family Medicine)  This Provider for this visit: Treatment Team:  Attending  Provider: Brand Males, MD    08/17/2022 -   Chief Complaint  Patient presents with   Follow-up    PFT performed today.  Pt states she has been doing okay since last visit and denies any complaints.      Sharon Sharon [name] 72 y.o. -returns for follow-up.  I personally saw her in August 2022.  She was supposed to see me back in 6 months but she has not.  She has been busy with her grandkids.  Her daughter-in-law Sharon [name] is expecting her fourth child.  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  In any event she is now on prednisone 8 mg/day.  She has gained a lot of weight.  She does not attribute this to prednisone but being sedentary.  She says that in the summer 2023 she did end up with back muscle strain?  Gluteus maximtis s and was on 6-week prednisone taper but is now back to baseline of 8 mg/day.  During this time she also felt she had a lot of cough and respiratory exacerbation but it appears that this is resolved.  She continues on Symbicort.  She takes Mucinex.  With the weight gain she is feeling more short of breath.  In fact her symptoms.  Worse.  Stairs are difficult.  Walking desaturation test is the same.  However she did feel dyspneic walking the third lab which she never used in the past.     PFT   OV 12/14/2022  Subjective:  Patient ID: Sharon [name], female , DOB: [birth year] , age 21 y.o. , MRN: [id number] , ADDRESS: [address] 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 Remot hx of RA Rx with NSAID but 2019 - autoimmune panel negative. Started prednisone 02/23/18 multidisciplinary  ILD conference September 04, 2019 -NSIP as first diagnosis with hypersensitive pneumonitis is a second.  Treatment consideration is prednisone but if progresses then add antifibrotic.  -Prednisone lowered from 10 mg/day to 8 mg/day in August 2022. >  Lowered to 5 mg/day of prednisone by February 2024   -Course is overall of 1 of stability.  In the summer 2020 did get worse in the absence of prednisone but improved with prednisone.  #  Normal stress test November 20200  Sharon Sharon [name] 73 y.o. -returns for follow-up.  Husband is here with her but he is not an independent historian.  At this point in time-she is on a gradual reduction of prednisone to 5 mg/day.  My plan was to keep.  Prednisone 5 mg/day.  This is because in December 2020 she flared up without prednisone and a pulmonary function test dropped and we had to go back on prednisone.  However she tells me that 3 days ago she ran out of prednisone.  She did have a prednisone burst in between because of right hip bursitis.  She is feeling stable without any decline in respiratory symptoms or steroid withdrawal.  However I did clarify that our intention was to keep her on daily prednisone.  She is open to this idea.  She only ran out of it 3 days ago.  She is willing to try a lower dose of prednisone at 4 mg/day.  Of note her daughter-in-law Sharon [name] who was an ICU nurse at [employer] had a fourth baby yesterday.  The baby's name is Urban Gibson.  She is pretty excited.  The oldest grandchild is 75 years old now.     SYMPTOM SCALE - ILD 08/07/2019  02/14/2020  03/17/2021  05/26/2021  08/17/2022 167# 12/14/2022   O2 use r       Shortness of Breath 0 -> 5 scale with 5 being worst (score 6 If unable to do)       At rest 0 0 0 0 0   Simple tasks - showers, clothes change, eating, shaving 0 0 0 0 1   Household (dishes, doing bed, laundry) 2 3 2  0 4   Shopping 1 2 0 0 4   Walking level at own pace 0 2 2 3 4    Walking up Stairs 5 5 3 4 5    Total  (40 - 48) Dyspnea Score 8 12 7 7 18    How bad is your cough? 0 0 2.5 0 0   How bad is your fatigue 0 0 3 4 3    nausea  0 0 0 0   vomit  0 0 0 0   diarhea  00 0 0 0   anzity  0 0 0 0   depression  3 3 2 3         Simple office walk 185 feet x  3 laps goal with forehead probe 02/23/2018  08/07/2019  02/14/2020  03/17/2021  08/17/2022  12/14/2022   O2 used Room air Room air Room air ra ra   Number laps completed 3 3 3 3 3    Comments about pace slow Normal pace Nl pace avg    Resting Pulse Ox/HR 100% and 71/min 100% and 68 100% and 84/min 100% and 78 100% and HR 71   Final Pulse Ox/HR 99% and 89/min 99% and 87/min 100% and 96/min 99% and 97 99% and HR 104   Desaturated </= 88% no no no no no   Desaturated <= 3% points no no no no no   Got Tachycardic >/= 90/min no no yes yes yes   Symptoms at end of test Mild dyspnea No complaints No complaints No complaints Mild dyspnea   Miscellaneous comments x          PFT     Latest Ref Rng & Units 12/10/2022    8:35 AM 08/17/2022    9:16 AM 01/25/2020    3:06 PM  05/11/2019    1:59 PM 05/18/2018   11:20 AM 03/20/2018    9:11 AM 12/13/2017    9:03 AM  PFT Results  FVC-Pre L 2.00  P 2.00  2.01  1.76  2.06  2.05  1.65   FVC-Predicted Pre % 72  P 72  71  61  71  71  58   FVC-Post L      2.16  1.94   FVC-Predicted Post %      75  68   Pre FEV1/FVC % % 83  P 87  83  82  89  88  85   Post FEV1/FCV % %      92  91   FEV1-Pre L 1.66  P 1.74  1.67  1.44  1.83  1.80  1.39   FEV1-Predicted Pre % 79  P 83  78  66  83  82  64   FEV1-Post L      1.99  1.76   DLCO uncorrected ml/min/mmHg 13.91  P 13.41  15.12  15.51    13.89   DLCO UNC% % 76  P 73  82  84    66   DLCO corrected ml/min/mmHg 13.91  P 13.41  15.12     14.35   DLCO COR %Predicted % 76  P 73  82     68   DLVA Predicted % 101  P 99  108  115    106     P Preliminary result   \ HRCT Feb 2024  Narrative & Impression  CLINICAL DATA:  Follow-up interstitial lung disease, NSIP  per 02/08/2018 surgical biopsy.   EXAM: CT CHEST WITHOUT CONTRAST   TECHNIQUE: Multidetector CT imaging of the chest was performed following the standard protocol without intravenous contrast. High resolution imaging of the lungs, as well as inspiratory and expiratory imaging, was performed.   RADIATION DOSE REDUCTION: This exam was performed according to the departmental dose-optimization program which includes automated exposure control, adjustment of the mA and/or kV according to patient size and/or use of iterative reconstruction technique.   COMPARISON:  05/01/2021 high-resolution chest CT.   FINDINGS: Cardiovascular: Normal heart size. No significant pericardial effusion/thickening. Three-vessel coronary atherosclerosis. Atherosclerotic nonaneurysmal thoracic aorta. Normal caliber pulmonary arteries.   Mediastinum/Nodes: No significant thyroid nodules. Unremarkable esophagus. No pathologically enlarged axillary, mediastinal or hilar lymph nodes, noting limited sensitivity for the detection of hilar adenopathy on this noncontrast study.   Lungs/Pleura: No pneumothorax. No pleural effusion. Surgical suture lines again noted in the periphery of the left upper and left lower lobes. Tiny solid 0.3 cm peripheral right upper lobe pulmonary nodule (series 10/image 41), stable. No acute consolidative airspace disease, lung masses or new significant pulmonary nodules. Mild to moderate patchy air trapping in both lungs on the expiration sequence, without evidence of tracheobronchomalacia. Patchy mild-to-moderate subpleural and peripheral peribronchovascular reticulation and ground-glass opacity throughout both lungs with associated mild traction bronchiectasis and mild architectural distortion. Findings are most prominent in the upper lobes with relative sparing at the lung bases. Mild paraseptal emphysema. No frank honeycombing. No appreciable interval progression of  these findings.   Upper abdomen: Small to moderate hiatal hernia. Simple 1.5 cm inferior right liver cyst.   Musculoskeletal: No aggressive appearing focal osseous lesions. Mild thoracic spondylosis.   IMPRESSION: 1. Spectrum of findings compatible with upper lobe predominant fibrotic interstitial lung disease, without appreciable interval progression. Mild to moderate patchy air trapping in both lungs. 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 Findings are suggestive of an alternative diagnosis (not UIP) per consensus guidelines: Diagnosis of Idiopathic Pulmonary Fibrosis: An Official ATS/ERS/JRS/ALAT Clinical Practice Guideline. [place], Iss 5, [phone], May 28 2017. 2. Three-vessel coronary atherosclerosis. 3. Small to moderate hiatal hernia. 4. Aortic Atherosclerosis (ICD10-I70.0) and Emphysema (ICD10-J43.9).     Electronically Signed   By: Ilona Sorrel M.[name].   On: 11/02/2022 15:28        has a past medical history of Anemia, Chronic major depressive disorder, DDD (degenerative disc disease), cervical, Depression, Dyspnea, Hypertension, Interstitial lung disease ([place]), Mixed hyperlipidemia, Pain in joints of both feet, Pneumonia (2016), Polyarthropathy, Stroke ([place]), Vision abnormalities, and Vitamin [name] deficiency.   reports that she quit smoking about 33 years ago. Her smoking use included cigarettes. She started smoking about 53 years ago. She has a 35.00 pack-year smoking history. She has never used smokeless tobacco.  Past Surgical History:  Procedure Laterality Date   CARPAL TUNNEL RELEASE     CESAREAN SECTION     x2   COLONOSCOPY     LUNG BIOPSY Left 02/08/2018   Procedure: LUNG BIOPSY;  Surgeon: Melrose Nakayama, MD;  Location: [place];  Service: Thoracic;  Laterality: Left;   right arm tendon surgery     TAYLOR BUNIONECTOMY     2 on one foot and 1 on the other foot   VIDEO ASSISTED THORACOSCOPY Left 02/08/2018   Procedure:  [place];  Surgeon: Melrose Nakayama, MD;  Location: [facility];  Service: Thoracic;  Laterality: Left;   VIDEO BRONCHOSCOPY N/A 02/08/2018   Procedure: VIDEO BRONCHOSCOPY;  Surgeon: Melrose Nakayama, MD;  Location: [facility];  Service: Thoracic;  Laterality: N/A;    Allergies  Allergen Reactions   Codeine Nausea Only    GI upset     Immunization History  Administered Date(s) Administered   Fluad Quad(high Dose 65+) 08/07/2019, 07/14/2021, 08/17/2022   Influenza, High Dose Seasonal PF 10/25/2017, 05/28/2020   Influenza-Unspecified 09/27/2013   PFIZER(Purple Top)SARS-COV-2 Vaccination 10/03/2019, 10/24/2019, 05/28/2020, 07/14/2021   Pneumococcal Conjugate-13 10/25/2017   Pneumococcal Polysaccharide-23 08/07/2019   Pneumococcal-Unspecified 08/07/2008   Zoster, Live 12/06/2013    Family History  Problem Relation Age of Onset   Emphysema Father    Asthma Father    Congestive Heart Failure Father    Stroke Father    Asthma Sister    COPD Sister    Alzheimer's disease Mother      Current Outpatient Medications:    albuterol (VENTOLIN HFA) 108 (90 Base) MCG/ACT inhaler, Inhale into the lungs., Disp: , Rfl:    ALPRAZolam (XANAX) 0.5 MG tablet, TAKE 1/2 TO 1 TABLET BY MOUTH DAILY AS NEEDED FOR ANXIETY. Limit use. Do not mix with tramadol., Disp: , Rfl:    aspirin EC 81 MG tablet, Take 81 mg by mouth daily., Disp: , Rfl:    atorvastatin (LIPITOR) 10 MG tablet, TAKE ONE TABLET BY MOUTH DAILY, Disp: , Rfl:    brimonidine (ALPHAGAN) 0.2 % ophthalmic solution, SMARTSIG:In Eye(s), Disp: , Rfl:    budesonide-formoterol (SYMBICORT) 80-4.5 MCG/ACT inhaler, Inhale into the lungs., Disp: , Rfl:    calcium carbonate (TUMS EX) 750 MG chewable tablet, Chew 1 tablet by mouth daily as needed for heartburn., Disp: , Rfl:    clopidogrel (PLAVIX) 75 MG tablet, Take by mouth., Disp: , Rfl:    dextromethorphan-guaiFENesin (MUCINEX DM) 30-600 MG per 12 hr tablet, Take 1 tablet by  mouth 2 (  two) times daily as needed (congestion). , Disp: , Rfl:    dorzolamide-timolol (COSOPT) 22.3-6.8 MG/ML ophthalmic solution, , Disp: , Rfl:    DULoxetine (CYMBALTA) 60 MG capsule, Take 30 mg by mouth in the morning, at noon, and at bedtime., Disp: , Rfl:    latanoprost (XALATAN) 0.005 % ophthalmic solution, Place 1 drop into both eyes at bedtime., Disp: , Rfl:    losartan (COZAAR) 50 MG tablet, Take 50 mg by mouth daily. , Disp: , Rfl:    predniSONE (DELTASONE) 1 MG tablet, Take 1 tablet (1 mg total) by mouth daily with breakfast. (Patient not taking: Reported on 12/14/2022), Disp: 270 tablet, Rfl: 3   predniSONE (DELTASONE) 5 MG tablet, Take 1 tablet (5 mg total) by mouth daily with breakfast. (Patient not taking: Reported on 12/14/2022), Disp: 90 tablet, Rfl: 3      Objective:   Vitals:   12/14/22 1532  BP: 126/76  Pulse: 93  Temp: 98.3 F (36.8 C)  TempSrc: Oral  SpO2: 97%  Weight: 166 lb 12.8 oz (75.7 kg)  Height: 5\' 2"  (1.575 m)    Estimated body mass index is 30.51 kg/m as calculated from the following:   Height as of this encounter: 5\' 2"  (1.575 m).   Weight as of this encounter: 166 lb 12.8 oz (75.7 kg).  @WEIGHTCHANGE @  [employer]   12/14/22 1532  Weight: 166 lb 12.8 oz (75.7 kg)     Physical Exam    General: No distress. Looks well Neuro: Alert and Oriented x 3. GCS 15. Speech normal Psych: Pleasant Resp:  Barrel Chest - no.  Wheeze - no, Crackles - yes some left base, No overt respiratory distress CVS: Normal heart sounds. Murmurs - no Ext: Stigmata of Connective Tissue Disease - no HEENT: Normal upper airway. PEERL +. No post nasal drip        Assessment:       ICD-10-CM   1. ILD (interstitial lung disease) ([place])  J84.9     2. 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[place])  J84.89     3. Long term (current) use of systemic steroids  Z79.52          Plan:     Patient Instructions  ILD (interstitial lung disease) ([facility]) NSIP  (nonspecific interstitial pneumonia) ([place])   - currently stable  on symptoms, PFT and CT chest of 5mg  per day of prednisone - there is risk for flare up of ILD (Similar to August 2020) without prednisone and also steroid withdrawal   Plan - resume prednisone but we can stay at 4mg  per day and continue at this dose - do spirometry and dlco in 3-4 months  Followup  - with Dr Chase Caller for 30 min visit in 3-4 months but after PFT   - symptom score and walk test at [facility] - return sooner if needed      SIGNATURE    Dr. Brand Males, M.[name]., F.C.C.P,  Pulmonary and Critical Care Medicine Staff Physician, [place] Director - Interstitial Lung Disease  Program  [facility] [place], [place], [address]  Pager: [phone], If no answer or between  15:00h - 7:00h: call [phone] Telephone: [phone]  4:09 PM 12/14/2022

## 2022-12-14 NOTE — Addendum Note (Signed)
Addended by: Alvin Critchley on: 12/14/2022 04:21 PM   Modules accepted: Orders

## 2024-01-12 ENCOUNTER — Other Ambulatory Visit: Payer: Self-pay | Admitting: Internal Medicine

## 2024-04-02 NOTE — Progress Notes (Unsigned)
 OV 11/01/2017 - ILD clinic   - transfer of care and 2nd opinion  Chief Complaint  Patient presents with   Advice Only    Referred by Dr. Sophronia due to an abnormal CT.  Pt does have complaints of a dry cough and SOB with exertion or if has a coughing episode.    73 year old female referred by the primary care physician to the ILD clinic for evaluation of her pulmonary problems with symptoms of cough and shortness of breath.  According to the patient for a better part of 2 years she has had episodic cough particularly in the fall season.  She says she is active in the outdoors doing gardening and cutting and blowing and mowing and she usually wears a mask but nevertheless she will get a bronchitis episode that will require nebulizers and prednisone  to resolve.  Symptoms will usually resolve over 2-3 weeks.  However this time around it is taken 8 weeks to resolve.  Last prednisone  was over a week or 2 ago.  She still has some ongoing wheezing.  In the background of this episodic cough she says between episodes she does not have any cough but she does have some baseline shortness of breath when she climbs a flight of stairs this is been stable and is of insidious onset also present for 2 years.  She did have a CT scan of the chest approximately 9 months ago in May 2018 that shows in my personal opinion based upon my personal visualization bilateral subpleural reticulation particularly in the upper lobes and some subpleural reticulation in the left lower lobe.  There is no clear distinct craniocaudal gradient in my opinion this would be indeterminate for UIP without a clear alternate etiology.  Given the presence of ILD findings she has been sent to the ILD clinic.  Review of the chart also shows she is seen.my colleague Dr. Darlean for asthma symptoms and is been placed on Symbicort  which she takes 1 puff twice daily.  The exam nitric oxide  a week after prednisone  and while on Symbicort  is elevated to near  abnormal levels at 48 ppb today  [employer] of chest physicians interstitial lung disease questionnaire -Past medical history: Positive for allergies.  IgE was elevated to 168 approximately a year or 2 ago on chart review.  In addition she gives a history of rheumatoid arthritis diagnosed many many years ago.  Seen by Dr. JONETTA at that time.  She is only followed up with nonsteroidal anti-inflammatory drugs.  She has not followed up with Dr. JONETTA in many years.  She feels she needs to go back.  However in May 2017 her autoimmune limited profile done here was negative.  -Personal exposure history: She has smoked marijuana in the past.  She smokes cigarettes from age 37 to age 79 a pack a day and quit.  -Family history of lung disease: Sister Niels who lives in [place]  apparently has had a lung biopsy and has been diagnosed with sarcoidosis recently.  She is also a smoker.  Father died of congestive heart failure.  There is no diagnosis of pulmonary fibrosis or hypersensitivity pneumonitis  -Home exposure history: Has not lived in a house that is old in the last 10 years.  There is no humidifier or insomnia or hot tub or Jacuzzi or water damage or mold.  She has 1 dog.  She does not have any birds.  She does not use any feathered pillows.  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 Range 02/03/2016 11:20  Anit Nuclear Antibody(ANA) Latest Ref Range: NEGATIVE  NEG  Cyclic Citrullin Peptide Ab Latest Units: Units <16  RA Latex Turbid. Latest Ref Range:  <=14 IU/mL <10    Walking desaturation test on 11/01/2017 185 feet x 3 laps on ROOM AIR:  did not desaturate. Rest pulse ox was 100%, final pulse ox was 97%. HR response 88/min at rest to 100/min at peak exertion. Patient Sharon [name]  Did not Desaturate < 88% . Sharon [name] yes did  Desaturated </= 3% points. Sharon [name] yes did get tachyardic   FeNO - 48ppb and almost abnormal   OV 12/13/2017  Chief Complaint  Patient presents with   Follow-up    PFT done today. Pt states after last visit on 11/01/17, she developed bronchitis and had it x2 weeks. Pt states she still has a mild cough. Denies any SOB or CP.   Follow-up interstitial lung disease with asthma/allergy  phenotype  She returns for follow-up after investigations.  She presents with her daughter-in-law Delon who is well-known to me through working to medical ICU where she is a Designer, jewellery.  We did a history read taken patient tells me that she moved from [place]  to [place]  many years ago.  She does regular gardening and works in the yard [employer].  Many times the leads are damp and there is she suspects mold in it.  She also burns the lesion is exposed to the smoke.  Symbicort  is helping but approximately 2 weeks ago had respiratory exacerbation that was not related to gardening [in fact she has not gotten this whole year] and ended up in the ER treated with antibiotics and prednisone .  Currently back to baseline.  She had pulmonary function test today that shows mixed obstruction and restriction associated with flow volume loop abnormalities.  She had a hypersensitivity pneumonitis panel documented below that is negative.  She had limited autoimmune panel [the CMA at last visit did not order the full panel] and this was normal.  She had repeat CT scan of the chest read by thoracic radiology I personally visualized this and agree with the findings.  It is indeterminate for UIP and there is no specific alternate  pattern.  No  air trapping is described.  The differential diagnosis appears to be broad.     Results for Sharon, [name] (MRN [id number]) as of 12/13/2017 09:57  Ref. Range 11/01/2017 16:22  Faenia retivirgula Latest Ref Range: NEGATIVE  NEGATIVE  S. VIRIDIS Latest Ref Range: NEGATIVE  NEGATIVE  T. CANDIDUS Latest Ref Range: NEGATIVE  NEGATIVE  T. VULGARIS Latest Ref Range: NEGATIVE  NEGATIVE    Results for Sharon, [name] (MRN [id number]) as of 12/13/2017 09:57  Ref. Range 11/01/2017 16:22  Anit Nuclear Antibody(ANA) Latest Ref Range: NEGATIVE  NEGATIVE  Angiotensin-Converting Enzyme Latest Ref Range: 9 - 67 U/L 25  Cyclic Citrullin Peptide Ab Latest Units: UNITS <16  ds DNA Ab Latest Units: IU/mL <1  RA Latex Turbid. Latest Ref Range: <14 IU/mL <  14  IgE (Immunoglobulin E), Serum Latest Ref Range: <OR=114 kU/L 252 (H)    IMPRESSION: CT FEb 2019 1. Pulmonary parenchymal pattern of fibrotic interstitial lung disease is unchanged from 02/07/2017 and may be due to nonspecific interstitial pneumonitis or mild chronic hypersensitivity pneumonitis. Findings are not consistent with usual interstitial pneumonitis. 2. Aortic atherosclerosis (ICD10-170.0). Coronary artery calcification.     Electronically Signed   By: Newell Eke M.D.   On: 11/14/2017 12:56   OV 02/23/2018  Chief Complaint  Patient presents with   Follow-up    Pt had lung biopsy done and states she is doing good since then. States only time she has pain is when she sneezes. Denies any current complaints of cough, SOB, or CP.    Here to review SLB results -slides have been read by our resident pathologist with whom I discussed via email.  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  In addition he did see some eosinophilic infiltrates in the interstitium.  This can explain the high nitric oxide  that she has.  She is here with her husband.  Since surgery  she is doing well.  She only has some pain in her chest when she sneezes at the postsurgical site.  She still has one suture hanging out which Dr. Kerrin said was okay to remove and my LPN removed it.  But overall he she and her husband are here to discuss the pathology report.  They want to know prognosis and implications.  Her grandchild is now 23 days old and she wants to spend a lot of time taking care of the grandchild.  She is aware of prednisone  side effects.  Her husband says that her mood might change because of prednisone  but patient herself says she has tolerated prednisone  just fine in the past.     OV 03/23/2018  Chief Complaint  Patient presents with   Follow-up    PFT performed 6/24.    Pt states she has been doing well since last visit. States only complaint is fatigue in the afternoons.    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 Remot hx of RA Rx with NSAID but 2019 - autoimmune panel negative. Started prednisone  02/23/18   Ms. Beever is here to follow-up for her interstitial lung disease as above. She tells me that she is going to start 40 mg prednisone  tomorrow. Meanwhile the prednisone  has caused her to have 10 pound weight gain. Her clothes are barely fitting her. She is also having acidity and acid reflux for which she is not on treatment at this point. Otherwise overall she is tolerating things fine. There are no other new issues. She had pulmonary function test today and it shows 11% improvement in the postbronchodilator FVC. She feels her dyspnea has improved         OV 08/07/2019  Subjective:  Patient ID: Sharon [name], female , DOB: October 10, 1950 , age 26 y.o. , MRN: [id number] , ADDRESS: [address] 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 Remot hx of RA Rx with NSAID but 2019 - autoimmune panel negative. Started prednisone  02/23/18 08/07/2019 -   Chief Complaint   Patient presents with   Follow-up    Pt states she has been doing well since last visit and denies any complaints with breathing.     HPI Sharon [name] 73 y.o. -last seen in May 2019.  She saw  our nurse practitioner in June 2020 and because she was doing well prednisone  was tapered and stopped.  But this then resulted in worsening shortness of breath and chest tightness.  She visited the beach in the summer and she found it very difficult to even walk short distances.  This because of shortness of breath and relieved by rest.  Then in August 2020 she saw a nurse practitioner again and had pulmonary function test which is shown below.  It declined.  She went back on prednisone  and is currently at 10 mg/day.  She feels back at baseline.  She says she does not have any organic antigen or mold exposure.  She has a Human resources officer.  There is no mold in the house.  No birds in the house.  Walking desaturation test today shows baseline normal performance.  However she tells me that when she climbs stairs she feels very short of breath.  She does have associated obesity. She wants to have flu and pneumovax   She is also complaining of chronic right infraaxillary right paralumbar region pain.  She says she has had previous multiple lipomas with excision.  She feels its tender to touch.  There is no urinary discomfort.  There is no fever or rash in the area.  This is a longstanding and intermittent.  There is no back pain.  After she left I noted that CT scan three-vessel coronary artery calcification.  I do not know if she has had a stress test.    HRCT Aug 2020  IMPRESSION: 1. There is a redemonstrated pattern of apical predominant mild pulmonary fibrosis featuring mild traction bronchiectasis, subpleural irregular ground-glass and occasional areas of bronchiolectasis with some elements of peribronchovascular ground-glass and fibrotic architectural distortion, particular in the left upper  lobe. There is evidence of interval left lung wedge biopsies. No evidence of air trapping on expiratory phase imaging. Findings are not significantly changed compared to prior examinations dating back to 02/07/2017 and remain in an alternate diagnosis pattern by ATS pulmonary fibrosis criteria, consistent with histologic diagnosis favoring NSIP.   2.  Stable, benign small pulmonary nodules.   3.  Coronary artery disease and aortic atherosclerosis.     Electronically Signed   By: Marolyn Jaksch M.D.   On: 05/01/2019 13:25    IMPRESSION: 1. There is a redemonstrated pattern of apical predominant mild pulmonary fibrosis featuring mild traction bronchiectasis, subpleural irregular ground-glass and occasional areas of bronchiolectasis with some elements of peribronchovascular ground-glass and fibrotic architectural distortion, particular in the left upper lobe. There is evidence of interval left lung wedge biopsies. No evidence of air trapping on expiratory phase imaging. Findings are not significantly changed compared to prior examinations dating back to 02/07/2017 and remain in an alternate diagnosis pattern by ATS pulmonary fibrosis criteria, consistent with histologic diagnosis favoring NSIP.   2.  Stable, benign small pulmonary nodules.   3.  Coronary artery disease and aortic atherosclerosis.     Electronically Signed   By: Marolyn Jaksch M.D.   On: 05/01/2019 13:25   ROS - per HPI     has a past medical history of Anemia, Chronic major depressive disorder, DDD (degenerative disc disease), cervical, Depression, Dyspnea, Hypertension, Interstitial lung disease (HCC), Mixed hyperlipidemia, Pain in joints of both feet, Pneumonia (2016), Polyarthropathy, Stroke [employer]), Vision abnormalities, and Vitamin D deficiency.    OV 02/14/2020  Subjective:  Patient ID: Sharon [name], female , DOB: Jul 16, 1951 , age 34 y.o. , MRN: [id number] ,  ADDRESS: [address] 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 Remot hx of RA Rx with NSAID but 2019 - autoimmune panel negative. 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  Treatment consideration is prednisone  but if progresses then add antifibrotic.   -Course is overall of 1 of stability.  In the summer 2020 did get worse in the absence of prednisone  but improved with prednisone .  #Normal stress test November 2020   02/14/2020 -   Chief Complaint  Patient presents with   Follow-up    SOB with walking, no coughing. doing well.      HPI TIMARIE LABELL 73 y.o. -presents for ILD follow-up.  After the last visit we did discuss her again at the case conference in December 2020.  NSIP is considered the leading diagnosis.  She is doing well on prednisone  10 mg/day.  She says the dyspnea is stable.  Although the symptom score maybe it is a little bit worse walking desaturation test is stable.  Pulmonary function test is stable/improved.  She has had a Covid vaccine.  She prefers to take prednisone  at 10 mg/day.  She does not want to taper.  She is having some back pain.  She says primary care physician Sophronia Ozell BROCKS, MD is monitoring her bone health.  Her recent cardiac stress test was normal.  I reviewed the results.   Results for Sharon, [name] (MRN [id number]) as of 02/14/2020 10:07  Ref. Range 12/13/2017 09:03 03/20/2018 09:11 05/18/2018 11:20 05/11/2019 13:59 01/25/2020 15:06  FVC-Pre Latest Units: L 1.65 2.05 2.06 1.76 -worsae without pred 2.01   Results for Sharon, [name] (MRN [id number]) as of 02/14/2020 10:07  Ref. Range 12/13/2017 09:03 03/20/2018 09:11 05/18/2018 11:20 05/11/2019 13:59 01/25/2020 15:06  DLCO unc Latest Units: ml/min/mmHg 13.89   15.51 15.12  DLCO unc % pred Latest Units: % 66   84 82        OV 03/17/2021  Subjective:  Patient ID: Sharon [name], female , DOB: Nov 09, 1950 , age 56 y.o. , MRN: [id number] , ADDRESS: [address] [place] [address] PCP Sophronia Ozell BROCKS, MD Patient Care Team: Sophronia Ozell BROCKS, MD as PCP - General (Family Medicine)  This Provider for this visit: Treatment Team:  Attending Provider: Geronimo Amel, MD    03/17/2021 -   Chief Complaint  Patient presents with   Follow-up    Pt states she was doing well up until 4 weeks ago. States 4 weeks ago she developed a head cold which then went in her chest and was prescribed abx and she states she is still having postnasal drainage and a cough.    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 Remot hx of RA Rx with NSAID but 2019 - autoimmune panel negative. 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  Treatment consideration is prednisone  but if progresses then add antifibrotic.   -Course is overall of 1 of stability.  In the summer 2020 did get worse in the absence of prednisone  but improved with prednisone .  #Normal stress test November 2020  HPI Sharon [name] 73 y.o. -last seen over a year ago.  She says she is doing really well from a ILD perspective and overall health is good.  Then approximately 1 month ago she feels she picked  up a head cold.  She is describing a sinus infection.  She was given erythromycin and then seem to get better but then a few weeks ago started having significant cough and mucus production.  But no change in shortness of breath.  PCP then gave her 12-day prednisone  that ended yesterday.  She feels the cough is better but then she started having new onset of frequent sinus drainage that is green ears are plugged particularly the right ear [there is wax on the exam of the right ear].  Nevertheless she feels highly stable.  Primary care felt that she needed to be seen by pulmonary.  Her last  pulmonary function testing was a year ago and last CT scan was 2 years ago.   No results found.    PFT   OV 05/26/2021  Subjective:  Patient ID: Sharon [name], female , DOB: June 21, 1951 , age 74 y.o. , MRN: [id number] , ADDRESS: [address] [place] [address] PCP Sophronia Ozell BROCKS, MD Patient Care Team: Sophronia Ozell BROCKS, MD as PCP - General (Family Medicine)  This Provider for this visit: Treatment Team:  Attending Provider: Geronimo Amel, MD    05/26/2021 -   Chief Complaint  Patient presents with   Follow-up    Pt states she has been doing okay since last visit and denies any complaints.   0 H0PI Sharon [name] 73 y.o. -continues to do well.  Symptom score is stable between June 2022 and now.  She had high-resolution CT chest in August 2022 shows 2-year stability.  I discussed this result to her.  She was supposed to have pulmonary function test but she forgot to schedule that.  We do not have the data but she feels stable and the CT scan is stable.  She continues on prednisone  10 mg/day.  She is tolerating it well but we discussed about possible reduction in dose she is open to this idea.  We did notice that 2 years ago when she stopped prednisone  she started doing poorly with lung function.  Therefore we feel there is a floor dose which she needs.  We agreed that we would slowly reduce the prednisone     CT Chest data Aug  2022  Narrative & Impression  CLINICAL DATA:  Interstitial lung disease.   EXAM: CT CHEST WITHOUT CONTRAST   TECHNIQUE: Multidetector CT imaging of the chest was performed following the standard protocol without intravenous contrast. High resolution imaging of the lungs, as well as inspiratory and expiratory imaging, was performed.   COMPARISON:  05/01/2019.   FINDINGS: Cardiovascular: Atherosclerotic calcification of the aorta and coronary arteries. Heart is at the upper limits of normal in size. No pericardial effusion.    Mediastinum/Nodes: No pathologically enlarged mediastinal or axillary lymph nodes. Hilar regions are difficult to definitively evaluate without IV contrast but appear grossly unremarkable. Esophagus is grossly unremarkable. Small hiatal hernia.   Lungs/Pleura: Upper and midlung zone predominant interstitial coarsening, traction bronchiectasis/bronchiolectasis, ground-glass and subpleural reticular densities, unchanged from 05/01/2019. Postoperative scarring in the left upper and left lower lobes. 4 mm peripheral right upper lobe nodule (9/48), unchanged and benign. There is air trapping. No pleural fluid. Airway is unremarkable.   Upper Abdomen: Low-attenuation lesions in the liver measure up to 1.6 cm and are likely cysts. Visualized portions of the liver, gallbladder, adrenal glands, kidneys, spleen, pancreas, stomach and bowel are otherwise unremarkable with the exception of a small hiatal hernia.   Musculoskeletal: No worrisome lytic or sclerotic  lesions.   IMPRESSION: 1. Pulmonary parenchymal pattern of fibrosis, as described above, unchanged from 05/01/2019 and compatible with biopsy-proven fibrotic nonspecific interstitial pneumonitis. Findings are suggestive of an alternative diagnosis (not UIP) per consensus guidelines: Diagnosis of Idiopathic Pulmonary Fibrosis: An Official ATS/ERS/JRS/ALAT Clinical Practice Guideline. Am JINNY Honey Crit Care Med Vol 198, Iss 5, [id number], May 28 2017. 2. Aortic atherosclerosis (ICD10-I70.0). Coronary artery calcification.     Electronically Signed   By: Newell Eke M.D.   On: 05/04/2021 13:58        OV 08/17/2022  Subjective:  Patient ID: Sharon [name], female , DOB: 1951/08/18 , age 31 y.o. , MRN: [id number] , ADDRESS: [address] [place] [address] PCP Sophronia Ozell BROCKS, MD Patient Care Team: Sophronia Ozell BROCKS, MD as PCP - General (Family Medicine)  This Provider for this visit: Treatment Team:  Attending  Provider: Geronimo Amel, MD    08/17/2022 -   Chief Complaint  Patient presents with   Follow-up    PFT performed today.  Pt states she has been doing okay since last visit and denies any complaints.      HPI Sharon [name] 73 y.o. -returns for follow-up.  I personally saw her in August 2022.  She was supposed to see me back in 6 months but she has not.  She has been busy with her grandkids.  Her daughter-in-law Delon is expecting her fourth child.  Delon used to work in the ICU at [employer].  In any event she is now on prednisone  8 mg/day.  She has gained a lot of weight.  She does not attribute this to prednisone  but being sedentary.  She says that in the summer 2023 she did end up with back muscle strain?  Gluteus maximtis s and was on 6-week prednisone  taper but is now back to baseline of 8 mg/day.  During this time she also felt she had a lot of cough and respiratory exacerbation but it appears that this is resolved.  She continues on Symbicort .  She takes Mucinex.  With the weight gain she is feeling more short of breath.  In fact her symptoms.  Worse.  Stairs are difficult.  Walking desaturation test is the same.  However she did feel dyspneic walking the third lab which she never used in the past.     PFT   OV 12/14/2022  Subjective:  Patient ID: Sharon [name], female , DOB: August 21, 1951 , age 14 y.o. , MRN: [id number] , ADDRESS: [address] [place] [address] PCP Sophronia Ozell BROCKS, MD Patient Care Team: Sophronia Ozell BROCKS, MD as PCP - General (Family Medicine)  This Provider for this visit: Treatment Team:  Attending Provider: Geronimo Amel, MD    12/14/2022 -   Chief Complaint  Patient presents with   Follow-up    Pft review, ct review, no other concerns     HPI Sharon [name] 73 y.o. -returns for follow-up.  Husband is here with her but he is not an independent historian.  At this point in time-she is on a gradual reduction of prednisone   to 5 mg/day.  My plan was to keep.  Prednisone  5 mg/day.  This is because in December 2020 she flared up without prednisone  and a pulmonary function test dropped and we had to go back on prednisone .  However she tells me that 3 days ago she ran out of prednisone .  She did have a prednisone  burst in between because of right  hip bursitis.  She is feeling stable without any decline in respiratory symptoms or steroid withdrawal.  However I did clarify that our intention was to keep her on daily prednisone .  She is open to this idea.  She only ran out of it 3 days ago.  She is willing to try a lower dose of prednisone  at 4 mg/day.  Of note her daughter-in-law Delon who was an ICU nurse at [employer] had a fourth baby yesterday.  The baby's name is Caitlin.  She is pretty excited.  The oldest grandchild is 28 years old now.      OV 04/03/2024  Subjective:  Patient ID: Sharon [name], female , DOB: 10-Jan-1951 , age 32 y.o. , MRN: [id number] , ADDRESS: [address] 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 Remot hx of RA Rx with NSAID but 2019 - autoimmune panel negative. 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  Treatment consideration is prednisone  but if progresses then add antifibrotic.  -Prednisone  lowered from 10 mg/day to 8 mg/day in August 2022. >  Lowered to 5 mg/day of prednisone  by February 2024- > 4 mg/day as of July 2025.   -Course is overall of 1 of stability.  In the summer 2020 did get worse in the absence of prednisone  but improved with prednisone .  #Normal stress test November 20200  04/03/2024 -   Chief Complaint  Patient  presents with   Follow-up    Sob with exertion and at rest.  Prednisone  is not helping.     HPI Sharon [name] 73 y.o. -returns for follow-up.  Presents with her husband.  He is an independent historian.  Daughter-in-law Delon former ICU nurse no longer works in the ICU.  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  They have 4 kids.  The youngest 80 is 73-year-old and the oldest 37 is 72 years old.  Patient states that the grandkids keep her busy but when they leave she gets exhausted.  She feels she is more dyspneic than 5 years ago but stable in the last 1 year.  Now that she feels she is not able to do the things she likes such as do gardening or even walk around the block.  Symptom scores appear stable although she feels she is getting worse.  She also says she has began to have a cough.  This on the symptom score appears to be true.  On exam today she had bilateral upper lobe crackles but she also had scattered wheezes and squeaks in the lung.  No hospitalizations no ER visits no urgent care visits no surgeries in the last year and 3 of 7-month since I last saw her.  Review of the records indicate that she has positive RAST antibody panel for dust mite.  She also has elevated IgE and blood eosinophils.  I presented this to her.  She did not remember these details.  The dog is no past.  The pillows are old but there are no feather pillow or down pillow or down jackets in the house.  They do use high and air filters in the house.  We talked about dust mite allergy  control and rechecking this.  Last Weight  Most recent update: 04/03/2024 10:37 AM    Weight  75 kg (165 lb 6.4 oz)              SYMPTOM SCALE - ILD 08/07/2019  02/14/2020  03/17/2021  05/26/2021  08/17/2022 167# 04/03/2024 165# Pred 4mg  per day  O2 use r       Shortness of Breath 0 -> 5 scale with 5 being worst (score 6 If unable to do)       At rest 0 0 0 0 0 0  Simple tasks - showers, clothes change, eating, shaving 0 0 0 0 1 0   Household (dishes, doing bed, laundry) 2 3 2  0 4 1  Shopping 1 2 0 0 4 1  Walking level at own pace 0 2 2 3 4 5   Walking up Stairs 5 5 3 4 5 5   Total (40 - 48) Dyspnea Score 8 12 7 7 18 11   How bad is your cough? 0 0 2.5 0 0 1  How bad is your fatigue 0 0 3 4 3 5   nausea  0 0 0 0 0  vomit  0 0 0 0 0  diarhea  00 0 0 0 0  anzity  0 0 0 0 0  depression  3 3 2 3 3        Simple office walk 185 feet x  3 laps goal with forehead probe 02/23/2018  08/07/2019  02/14/2020  03/17/2021  08/17/2022  04/03/2024   O2 used Room air Room air Room air ra ra   Number laps completed 3 3 3 3 3    Comments about pace slow Normal pace Nl pace avg    Resting Pulse Ox/HR 100% and 71/min 100% and 68 100% and 84/min 100% and 78 100% and HR 71   Final Pulse Ox/HR 99% and 89/min 99% and 87/min 100% and 96/min 99% and 97 99% and HR 104   Desaturated </= 88% no no no no no   Desaturated <= 3% points no no no no no   Got Tachycardic >/= 90/min no no yes yes yes   Symptoms at end of test Mild dyspnea No complaints No complaints No complaints Mild dyspnea   Miscellaneous comments x             SIT STAND TEST - goal 15 times   04/03/2024    O2 used ra   PRobe - finter or forehead forehead   Number sit and stand completed - goal 15 15 - had knee pain at 10   Time taken to complete 92 sec   Resting Pulse Ox/HR/Dyspnea  100% and 74/min and dyspnea of 1/10    Peak measures 95 % and 103/min and dyspnea of 8/10   Final Pulse Ox/HR 100% and 75/min and dyspnea of 5/10   Desaturated </= 88% no   Desaturated <= 3% points yes   Got Tachycardic >/= 90/min yes   Miscellaneous comments Slow, dyspneic and had knee paind       PFT     Latest Ref Rng & Units 12/10/2022    8:35 AM 08/17/2022    9:16 AM 01/25/2020    3:06 PM 05/11/2019    1:59 PM 05/18/2018   11:20 AM 03/20/2018    9:11 AM 12/13/2017    9:03 AM  PFT Results  FVC-Pre L 2.00  2.00  2.01  1.76  2.06  2.05  1.65   FVC-Predicted Pre % 72  72  71   61  71  71  58   FVC-Post L      2.16  1.94   FVC-Predicted Post %      75  68   Pre FEV1/FVC % % 83  87  83  82  89  88  85   Post FEV1/FCV % %      92  91   FEV1-Pre L 1.66  1.74  1.67  1.44  1.83  1.80  1.39   FEV1-Predicted Pre % 79  83  78  66  83  82  64   FEV1-Post L      1.99  1.76   DLCO uncorrected ml/min/mmHg 13.91  13.41  15.12  15.51    13.89   DLCO UNC% % 76  73  82  84    66   DLCO corrected ml/min/mmHg 13.91  13.41  15.12     14.35   DLCO COR %Predicted % 76  73  82     68   DLVA Predicted % 101  99  108  115    106        LAB RESULTS last 96 hours No results found.       has a past medical history of Anemia, Chronic major depressive disorder, DDD (degenerative disc disease), cervical, Depression, Dyspnea, Hypertension, Interstitial lung disease (HCC), Mixed hyperlipidemia, Pain in joints of both feet, Pneumonia (2016), Polyarthropathy, Stroke (HCC), Vision abnormalities, and Vitamin D deficiency.   reports that she quit smoking about 34 years ago. Her smoking use included cigarettes. She started smoking about 54 years ago. She has a 35 pack-year smoking history. She has never used smokeless tobacco.  Past Surgical History:  Procedure Laterality Date   CARPAL TUNNEL RELEASE     CESAREAN SECTION     x2   COLONOSCOPY     LUNG BIOPSY Left 02/08/2018   Procedure: LUNG BIOPSY;  Surgeon: Kerrin Elspeth BROCKS, MD;  Location: [facility] OR;  Service: Thoracic;  Laterality: Left;   right arm tendon surgery     TAYLOR BUNIONECTOMY     2 on one foot and 1 on the other foot   VIDEO ASSISTED THORACOSCOPY Left 02/08/2018   Procedure: VIDEO ASSISTED THORACOSCOPY;  Surgeon: Kerrin Elspeth BROCKS, MD;  Location: [facility] OR;  Service: Thoracic;  Laterality: Left;   VIDEO BRONCHOSCOPY N/A 02/08/2018   Procedure: VIDEO BRONCHOSCOPY;  Surgeon: Kerrin Elspeth BROCKS, MD;  Location: [facility] OR;  Service: Thoracic;  Laterality: N/A;    Allergies  Allergen Reactions   Codeine  Nausea Only    GI  upset     Immunization History  Administered Date(s) Administered   Fluad Quad(high Dose 65+) 08/07/2019, 07/14/2021, 08/17/2022   Influenza, High Dose Seasonal PF 10/25/2017, 05/28/2020   Influenza-Unspecified 09/27/2013   PFIZER(Purple Top)SARS-COV-2 Vaccination 10/03/2019, 10/24/2019, 05/28/2020, 07/14/2021   Pneumococcal Conjugate-13 10/25/2017   Pneumococcal Polysaccharide-23 08/07/2019   Pneumococcal-Unspecified 08/07/2008   Zoster, Live 12/06/2013    Family History  Problem Relation Age of Onset   Emphysema Father    Asthma Father    Congestive Heart Failure Father    Stroke Father    Asthma Sister    COPD Sister    Alzheimer's disease Mother      Current Outpatient Medications:    albuterol  (VENTOLIN  HFA) 108 (90 Base) MCG/ACT inhaler, Inhale into the lungs., Disp: , Rfl:    ALPRAZolam  (XANAX ) 0.5 MG tablet, TAKE 1/2 TO 1 TABLET BY MOUTH  DAILY AS NEEDED FOR ANXIETY. Limit use. Do not mix with tramadol ., Disp: , Rfl:    aspirin  EC 81 MG tablet, Take 81 mg by mouth daily., Disp: , Rfl:    atorvastatin  (LIPITOR) 10 MG tablet, TAKE ONE TABLET BY MOUTH DAILY, Disp: , Rfl:    brimonidine (ALPHAGAN) 0.2 % ophthalmic solution, SMARTSIG:In Eye(s), Disp: , Rfl:    budesonide -formoterol  (SYMBICORT ) 80-4.5 MCG/ACT inhaler, Inhale into the lungs., Disp: , Rfl:    calcium  carbonate (TUMS EX) 750 MG chewable tablet, Chew 1 tablet by mouth daily as needed for heartburn., Disp: , Rfl:    clopidogrel  (PLAVIX ) 75 MG tablet, Take by mouth., Disp: , Rfl:    dextromethorphan-guaiFENesin (MUCINEX DM) 30-600 MG per 12 hr tablet, Take 1 tablet by mouth 2 (two) times daily as needed (congestion). , Disp: , Rfl:    dorzolamide-timolol (COSOPT) 22.3-6.8 MG/ML ophthalmic solution, , Disp: , Rfl:    DULoxetine  (CYMBALTA ) 60 MG capsule, Take 30 mg by mouth in the morning, at noon, and at bedtime., Disp: , Rfl:    latanoprost  (XALATAN ) 0.005 % ophthalmic solution, Place 1 drop into both eyes at  bedtime., Disp: , Rfl:    losartan  (COZAAR ) 50 MG tablet, Take 50 mg by mouth daily. , Disp: , Rfl:    predniSONE  (DELTASONE ) 1 MG tablet, TAKE FOUR TABLETS BY MOUTH DAILY WITH BREAKFAST, Disp: 360 tablet, Rfl: 0   predniSONE  (DELTASONE ) 5 MG tablet, Take 1 tablet (5 mg total) by mouth daily with breakfast., Disp: 90 tablet, Rfl: 3      Objective:   Vitals:   04/03/24 1033  BP: 126/64  Pulse: 76  Temp: 98 F (36.7 C)  TempSrc: Oral  SpO2: 96%  Weight: 165 lb 6.4 oz (75 kg)  Height: 5' 2 (1.575 m)    Estimated body mass index is 30.25 kg/m as calculated from the following:   Height as of this encounter: 5' 2 (1.575 m).   Weight as of this encounter: 165 lb 6.4 oz (75 kg).  @WEIGHTCHANGE @  [employer]   04/03/24 1033  Weight: 165 lb 6.4 oz (75 kg)     Physical Exam   General: No distress. O2 at rest: mp Cane present: mpmp Sitting in wheel chair: mp Frail: p Obese: ues Neuro: Alert and Oriented x 3. GCS 15. Speech normal Psych: Pleasant Resp:  Barrel Chest - mp.  Wheeze - scattered es, Crackles - yes UL, No overt respiratory distress CVS: Normal heart sounds. Murmurs - n Ext: Stigmata of Connective Tissue Disease - no HEENT: Normal upper airway. PEERL +. No post nasal drip        Assessment:       ICD-10-CM   1. DOE (dyspnea on exertion)  R06.09 CBC w/Diff    IgE    Antinuclear Antib (ANA)    ANCA Profile    Pulmonary function test    CT Chest High Resolution    B Nat Peptide    2. ILD (interstitial lung disease) (HCC)  J84.9 CBC w/Diff    IgE    Antinuclear Antib (ANA)    ANCA Profile    Pulmonary function test    CT Chest High Resolution    B Nat Peptide    3. 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HCC)  J84.89 CBC w/Diff    IgE    Antinuclear Antib (ANA)    ANCA Profile    Pulmonary function test    CT Chest High Resolution    B  Nat Peptide    4. Wheezing  R06.2 CBC w/Diff    IgE    Antinuclear Antib (ANA)    ANCA Profile     Pulmonary function test    CT Chest High Resolution    B Nat Peptide    5. Positive radioallergosorbent test (RAST)  T78.49XA CBC w/Diff    IgE    Antinuclear Antib (ANA)    ANCA Profile    Pulmonary function test    CT Chest High Resolution    B Nat Peptide    6. Elevated IgE level  R76.8 CBC w/Diff    IgE    Antinuclear Antib (ANA)    ANCA Profile    Pulmonary function test    CT Chest High Resolution    B Nat Peptide    7. Eosinophilia, unspecified type  D72.10 CBC w/Diff    IgE    Antinuclear Antib (ANA)    ANCA Profile    Pulmonary function test    CT Chest High Resolution    B Nat Peptide    8. House dust mite allergy   Z91.09 CBC w/Diff    IgE    Antinuclear Antib (ANA)    ANCA Profile    Pulmonary function test    CT Chest High Resolution    B Nat Peptide         Plan:     Patient Instructions  DOE (dyspnea on exertion)  /-Need to figure out if shortness of breath is potentially worse [although symptom score suggest long-term stability] because of interstitial lung disease or asthma/allergy  issues  Plan - See respective sections below - do handicap placard 04/03/2024   ILD (interstitial lung disease) (HCC) 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HCC)   -Need to rule out any worsening of interstitial lung disease  Plan - For now continue prednisone  4 mg/day [if there is worsening then we will consider antifibrotic's] - Do spirometry and DLCO in the next 8 weeks - Do high-resolution CT chest supine and prone in the next 8 weeks. - Do ANA and MPO antibody [previously MPO antibody positive many years ago] 04/03/2024  - Check blood BNP today 04/03/2024    Wheezing Positive radioallergosorbent test (RAST) Elevated IgE level Eosinophilia, unspecified type House dust mite allergy   -In 2017 significant environmental allergies including various environmental agents such as pollen, dust mite and dog.  Also reflected with elevated eosinophils and  elevated IgE  Plan - Recheck CBC with differential and blood IgE - Control house dust mite allergy  [see below]  = Continue Symbicort  for the moment - Consider biologic based on the results at the time of follow-up.   Followup  - Dr. Geronimo or nurse practitioner in 8 weeks but after testing -consider asthma biologic therapy or antifibrotic's of both           X[id number]  HOUSE DUST MITE     Google Dust Mite Allergy  and get info from Asthma and Allergy  [employer] and pillows in zippered dust-proof covers. These covers are made of a material with pores too small to let dust mites and their waste product through. They are also called allergen-impermeable. Plastic or vinyl covers are the least expensive, but some people find them uncomfortable. You can buy other fabric allergen-impermeable covers from many regular bedding stores.   Wash your sheets and blankets weekly in hot water. You have to wash them in water that's at least 130 F or more to kill dust  mites.  Get rid of all types of fabric that mites love and that you cannot easily wash regularly in hot water.   Avoid wall-to-wall carpeting, curtains, blinds, upholstered furniture and down-filled covers and pillows in the bedroom. Put roll-type shades on your windows instead of curtains.  Have someone without a dust mite allergy  clean your bedroom. If this is not possible, wear a filtering mask when dusting or vacuuming. Many drug stores carry these items. Dusting and vacuuming stir up dust. So try to do these chores when you can stay out of the bedroom for a while afterward.   Special CERTIFIED filter vacuum cleaners can help to keep mites and mite waste from getting back into the air. CERTIFIED vacuums are scientifically tested and verified as more suitable for making your home healthier.  Vacuuming is not enough to remove all dust mites and their waste. A large amount of  the dust mite population may remain because they live deep inside the stuffing of sofas, chairs, mattresses, pillows, and carpeting.  Treat other rooms in your house like your bedroom. Here are more tips:  Avoid wall-to-wall carpeting, if possible. If you do use carpeting, mites don't like the type with a short, tight pile as much. Use washable throw rugs over regularly damp-mopped wood, linoleum or tiled floors. Wash rugs in hot water whenever possible. Cold water isn't as effective. Dry cleaning kills all dust mites and is also good for removing dust mites from living in fabrics. Keep the humidity in your home less than 50%. Use a dehumidifier and/or air conditioner to do this. Use a CERTIFIED filter with your central furnace and air conditioning unit. This can help trap dust mites from your entire home. Freestanding air cleaners only filter air in a limited area. Avoid devices that treat air with heat, electrostatic ions, or ozon     FOLLOWUP Return for Nurse practitioner Fawne Hughley in 30-minute visit in 8 weeks.    SIGNATURE    Dr. Dorethia Cave, M.D., F.C.C.P,  Pulmonary and Critical Care Medicine Staff Physician, [facility] Director - Interstitial Lung Disease  Program  Pulmonary Fibrosis [facility] [place], [place], [address]  Pager: [phone], If no answer or between  15:00h - 7:00h: call [phone] Telephone: [phone]  11:06 AM 04/03/2024   Moderate Complexity MDM OFFICE  2021 E/M guidelines, first released in 2021, with minor revisions added in 2023 and 2024 Must meet the requirements for 2 out of 3 dimensions to qualify.    Number and complexity of problems addressed Amount and/or complexity of data reviewed Risk of complications and/or morbidity  One or more chronic illness with mild exacerbation, OR progression, OR  side effects of treatment  Two or more stable chronic illnesses  One undiagnosed  new problem with uncertain prognosis  One acute illness with systemic symptoms   One Acute complicated injury Must meet the requirements for 1 of 3 of the categories)  Category 1: Tests and documents, historian  Any combination of 3 of the following:  Assessment requiring an independent historian  Review of prior external note(s) from each unique source  Review of results of each unique test  Ordering of each unique test    Category 2: Interpretation of tests   Independent interpretation of a test performed by another physician/other qualified health care professional (not separately reported)  Category 3: Discuss management/tests  Discussion of management or test interpretation with  external physician/other qualified health care professional/appropriate source (not separately reported) Moderate risk of morbidity from additional diagnostic testing or treatment Examples only:  Prescription drug management  Decision regarding minor surgery with identfied patient or procedure risk factors  Decision regarding elective major surgery without identified patient or procedure risk factors  Diagnosis or treatment significantly limited by social determinants of health             HIGh Complexity  OFFICE   2021 E/M guidelines, first released in 2021, with minor revisions added in 2023. Must meet the requirements for 2 out of 3 dimensions to qualify.    Number and complexity of problems addressed Amount and/or complexity of data reviewed Risk of complications and/or morbidity  Severe exacerbation of chronic illness  Acute or chronic illnesses that may pose a threat to life or bodily function, e.g., multiple trauma, acute MI, pulmonary embolus, severe respiratory distress, progressive rheumatoid arthritis, psychiatric illness with potential threat to self or others, peritonitis, acute renal failure, abrupt change in neurological status Must meet the requirements for 2 of 3 of  the categories)  Category 1: Tests and documents, historian  Any combination of 3 of the following:  Assessment requiring an independent historian  Review of prior external note(s) from each unique source  Review of results of each unique test  Ordering of each unique test    Category 2: Interpretation of tests    Independent interpretation of a test performed by another physician/other qualified health care professional (not separately reported)  Category 3: Discuss management/tests  Discussion of management or test interpretation with external physician/other qualified health care professional/appropriate source (not separately reported)  HIGH risk of morbidity from additional diagnostic testing or treatment Examples only:  Drug therapy requiring intensive monitoring for toxicity  Decision for elective major surgery with identified pateint or procedure risk factors  Decision regarding hospitalization or escalation of level of care  Decision for DNR or to de-escalate care   Parenteral controlled  substances            LEGEND - Independent interpretation involves the interpretation of a test for which there is a CPT code, and an interpretation or report is customary. When a review and interpretation of a test is performed and documented by the provider, but not separately reported (billed), then this would represent an independent interpretation. This report does not need to conform to the usual standards of a complete report of the test. This does not include interpretation of tests that do not have formal reports such as a complete blood count with differential and blood cultures. Examples would include reviewing a chest radiograph and documenting in the medical record an interpretation, but not separately reporting (billing) the interpretation of the chest radiograph.   An appropriate source includes professionals who are not health care professionals but may  be involved in the management of the patient, such as a Clinical research associate, upper officer, case manager or teacher, and does not include discussion with family or informal caregivers.    - SDOH: SDOH are the conditions in the environments where people are born, live, learn, work, play, worship, and age that affect a wide range of health, functioning, and quality-of-life outcomes and risks. (e.g., housing, food insecurity, transportation, etc.). SDOH-related Z codes ranging from Z55-Z65 are the ICD-10-CM diagnosis codes used to document SDOH data Z55 - Problems related to education and literacy Z56 - Problems related to employment and unemployment Z57 - Occupational exposure to risk factors Z58 - Problems  related to physical environment Z59 - Problems related to housing and economic circumstances [phone] - Problems related to social environment [phone] - Problems related to upbringing [phone] - Other problems related to primary support group, including family circumstances Z3 - Problems related to certain psychosocial circumstances Z65 - Problems related to other psychosocial circumstances

## 2024-04-02 NOTE — Patient Instructions (Signed)
ILD (interstitial lung disease) (HCC) 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HCC)   - currently stable  on symptoms, PFT and CT chest of 5mg  per day of prednisone - there is risk for flare up of ILD (Similar to August 2020) without prednisone and also steroid withdrawal   Plan - resume prednisone but we can stay at 4mg  per day and continue at this dose - do spirometry and dlco in 3-4 months  Followup  - with Dr Chase Caller for 30 min visit in 3-4 months but after PFT   - symptom score and walk test at folloowup - return sooner if needed

## 2024-04-03 ENCOUNTER — Encounter: Payer: Self-pay | Admitting: Internal Medicine

## 2024-04-03 ENCOUNTER — Ambulatory Visit: Admitting: Internal Medicine

## 2024-04-03 VITALS — BP 126/64 | HR 76 | Temp 98.0°F | Ht 62.0 in | Wt 165.4 lb

## 2024-04-03 DIAGNOSIS — J8489 Other specified interstitial pulmonary diseases: Secondary | ICD-10-CM | POA: Diagnosis not present

## 2024-04-03 DIAGNOSIS — R768 Other specified abnormal immunological findings in serum: Secondary | ICD-10-CM

## 2024-04-03 DIAGNOSIS — J849 Interstitial pulmonary disease, unspecified: Secondary | ICD-10-CM

## 2024-04-03 DIAGNOSIS — R0609 Other forms of dyspnea: Secondary | ICD-10-CM | POA: Diagnosis not present

## 2024-04-03 DIAGNOSIS — R062 Wheezing: Secondary | ICD-10-CM

## 2024-04-03 DIAGNOSIS — D721 Eosinophilia, unspecified: Secondary | ICD-10-CM

## 2024-04-03 DIAGNOSIS — Z9109 Other allergy status, other than to drugs and biological substances: Secondary | ICD-10-CM

## 2024-04-03 DIAGNOSIS — T7849XA Other allergy, initial encounter: Secondary | ICD-10-CM

## 2024-04-03 LAB — CBC WITH DIFFERENTIAL/PLATELET
Basophils Absolute: 0.1 K/uL (ref 0.0–0.1)
Basophils Relative: 0.6 % (ref 0.0–3.0)
Eosinophils Absolute: 0.4 K/uL (ref 0.0–0.7)
Eosinophils Relative: 3.2 % (ref 0.0–5.0)
HCT: 38.9 % (ref 36.0–46.0)
Hemoglobin: 12.6 g/dL (ref 12.0–15.0)
Lymphocytes Relative: 15.4 % (ref 12.0–46.0)
Lymphs Abs: 1.8 K/uL (ref 0.7–4.0)
MCHC: 32.4 g/dL (ref 30.0–36.0)
MCV: 80.9 fl (ref 78.0–100.0)
Monocytes Absolute: 0.5 K/uL (ref 0.1–1.0)
Monocytes Relative: 4.8 % (ref 3.0–12.0)
Neutro Abs: 8.7 K/uL — ABNORMAL HIGH (ref 1.4–7.7)
Neutrophils Relative %: 76 % (ref 43.0–77.0)
Platelets: 417 K/uL — ABNORMAL HIGH (ref 150.0–400.0)
RBC: 4.81 Mil/uL (ref 3.87–5.11)
RDW: 15.9 % — ABNORMAL HIGH (ref 11.5–15.5)
WBC: 11.4 K/uL — ABNORMAL HIGH (ref 4.0–10.5)

## 2024-04-03 LAB — BRAIN NATRIURETIC PEPTIDE: Pro B Natriuretic peptide (BNP): 83 pg/mL (ref 0.0–100.0)

## 2024-04-04 LAB — ANA: Anti Nuclear Antibody (ANA): NEGATIVE

## 2024-04-04 LAB — IGE: IgE (Immunoglobulin E), Serum: 516 kU/L — ABNORMAL HIGH (ref <=114)

## 2024-04-05 LAB — ANCA PROFILE
Anti-MPO Antibodies: <0.2 U (ref 0.0–0.9)
Anti-PR3 Antibodies: <0.2 U (ref 0.0–0.9)
Atypical pANCA: <1:20 {titer}
C-ANCA: <1:20 {titer}
P-ANCA: <1:20 {titer}

## 2024-04-13 ENCOUNTER — Other Ambulatory Visit: Payer: Self-pay | Admitting: Internal Medicine

## 2024-04-14 ENCOUNTER — Ambulatory Visit: Payer: Self-pay | Admitting: Internal Medicine

## 2024-04-14 NOTE — Telephone Encounter (Signed)
 BLood IgE and Blood Eos are high - suggesting allergic asthma.   Plan  - I think she will benefit from a bipl;ogic like fasenral, nucala or dupixen - give appt with app

## 2024-04-16 NOTE — Progress Notes (Signed)
 Pt notified of results and appt with Candis scheduled for 04/18/24.

## 2024-04-18 ENCOUNTER — Other Ambulatory Visit (HOSPITAL_COMMUNITY): Payer: Self-pay

## 2024-04-18 ENCOUNTER — Ambulatory Visit

## 2024-04-18 ENCOUNTER — Telehealth: Payer: Self-pay

## 2024-04-18 VITALS — BP 122/70 | HR 72 | Temp 98.4°F | Ht 62.0 in | Wt 167.0 lb

## 2024-04-18 DIAGNOSIS — D721 Eosinophilia, unspecified: Secondary | ICD-10-CM

## 2024-04-18 DIAGNOSIS — J8489 Other specified interstitial pulmonary diseases: Secondary | ICD-10-CM

## 2024-04-18 DIAGNOSIS — R768 Other specified abnormal immunological findings in serum: Secondary | ICD-10-CM

## 2024-04-18 DIAGNOSIS — J45991 Cough variant asthma: Secondary | ICD-10-CM

## 2024-04-18 DIAGNOSIS — I10 Essential (primary) hypertension: Secondary | ICD-10-CM | POA: Diagnosis not present

## 2024-04-18 MED ORDER — BUDESONIDE-FORMOTEROL FUMARATE 80-4.5 MCG/ACT IN AERO: 2.0000 | INHALATION_SPRAY | Freq: Two times a day (BID) | RESPIRATORY_TRACT | 3 refills | Status: AC

## 2024-04-18 NOTE — Patient Instructions (Addendum)
 Start Dupixent as discussed.  Okay to keep appointment as scheduled in October.

## 2024-04-18 NOTE — Telephone Encounter (Signed)
 Sharon Conger, PA-C 04/18/24 11:05 AM Note FYI:  Starting on Dupixent     Submitted a Prior Authorization request to [facility] for DUPIXENT via CoverMyMeds. Will update once we receive a response.  Key: [id number]

## 2024-04-18 NOTE — Telephone Encounter (Signed)
 FYI:  Starting on Dupixent

## 2024-04-18 NOTE — Progress Notes (Signed)
 @Patient  ID: Sharon [name], female    DOB: 1951-05-05, 73 y.o.   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  BNP and ANA were negative, however IgE was elevated at 516 and eos were >300.  She reports that she has been out of her Symbicort  for 1-2 months and has noticed a more prominent cough since that.  She has been using her Albuterol  in the meantime for this.  She denies chest pain, fever, chills, productive cough, worsening dyspnea.  She does report a runny nose and feeling that she has congestion in her ears at times.    TEST/EVENTS :   Allergies  Allergen Reactions   Codeine  Nausea Only    GI upset     Immunization History  Administered Date(s) Administered   Fluad Quad(high Dose 65+) 08/07/2019, 07/14/2021, 08/17/2022   Influenza, High Dose Seasonal PF 10/25/2017, 05/28/2020   Influenza, Mdck, Trivalent,PF 6+ MOS(egg free) 08/16/2023   Influenza-Unspecified 09/27/2013   PFIZER(Purple Top)SARS-COV-2 Vaccination 10/03/2019, 10/24/2019, 05/28/2020, 07/14/2021   Pneumococcal Conjugate-13 10/25/2017   Pneumococcal Polysaccharide-23 08/07/2019   Pneumococcal-Unspecified 08/07/2008   Zoster, Live 12/06/2013    Past Medical History:  Diagnosis Date   Anemia    'years ago   Chronic major depressive disorder    DDD (degenerative disc disease), cervical    Depression    Dyspnea    Hypertension    Interstitial lung disease (HCC)    Mixed hyperlipidemia    Pain in joints of both feet    Pneumonia 2016   Polyarthropathy    Stroke (HCC)    TIA 11/11/17   Vision abnormalities    catracts left eye   Vitamin D deficiency     Tobacco History: Social History   Tobacco Use  Smoking Status Former   Current  packs/day: 0.00   Average packs/day: 1.8 packs/day for 20.0 years (35.0 ttl pk-yrs)   Types: Cigarettes   Start date: 61   Quit date: 35   Years since quitting: 34.5  Smokeless Tobacco Never   Counseling given: Not Answered   Outpatient Medications Prior to Visit  Medication Sig Dispense Refill   albuterol  (VENTOLIN  HFA) 108 (90 Base) MCG/ACT inhaler Inhale into the lungs.     ALPRAZolam  (XANAX ) 0.5 MG tablet TAKE 1/2 TO 1 TABLET BY MOUTH DAILY AS NEEDED FOR ANXIETY. Limit use. Do not mix with tramadol .     atorvastatin  (LIPITOR) 10 MG tablet TAKE ONE TABLET BY MOUTH DAILY     brimonidine (ALPHAGAN) 0.2 % ophthalmic solution SMARTSIG:In Eye(s)     calcium  carbonate (TUMS EX) 750 MG chewable tablet Chew 1 tablet by mouth daily as needed for heartburn.     celecoxib (CELEBREX) 200 MG capsule Take 200 mg by mouth daily.     dextromethorphan-guaiFENesin (MUCINEX DM) 30-600 MG per 12 hr tablet Take 1 tablet by mouth 2 (two) times daily as needed (congestion).      diclofenac  (VOLTAREN ) 50 MG EC tablet Take 50 mg by mouth 2 (two) times daily.     DULoxetine  (CYMBALTA ) 60 MG capsule Take 30 mg by mouth in the morning, at noon, and at bedtime.     latanoprost  (XALATAN ) 0.005 % ophthalmic solution Place 1  drop into both eyes at bedtime.     losartan  (COZAAR ) 50 MG tablet Take 50 mg by mouth daily.      predniSONE  (DELTASONE ) 1 MG tablet TAKE 4 TABLETS BY MOUTH DAILY WITH BREAKFAST 360 tablet 1   budesonide -formoterol  (SYMBICORT ) 80-4.5 MCG/ACT inhaler Inhale into the lungs.     predniSONE  (DELTASONE ) 5 MG tablet Take 1 tablet (5 mg total) by mouth daily with breakfast. 90 tablet 3   aspirin  EC 81 MG tablet Take 81 mg by mouth daily. (Patient not taking: Reported on 04/18/2024)     clopidogrel  (PLAVIX ) 75 MG tablet Take by mouth. (Patient not taking: Reported on 04/18/2024)     dorzolamide-timolol (COSOPT) 22.3-6.8 MG/ML ophthalmic solution  (Patient not taking: Reported on 04/18/2024)     No  facility-administered medications prior to visit.     Review of Systems:   Constitutional:   No  weight loss, night sweats,  Fevers, chills, fatigue, or  lassitude.  HEENT:   No headaches,  Difficulty swallowing,  Tooth/dental problems, or  Sore throat,                No sneezing, itching, ear ache, nasal congestion, post nasal drip,   CV:  No chest pain,  Orthopnea, PND, swelling in lower extremities, anasarca, dizziness, palpitations, syncope.   GI  No heartburn, indigestion, abdominal pain, nausea, vomiting, diarrhea, change in bowel habits, loss of appetite, bloody stools.   Resp: No shortness of breath with exertion or at rest.  No excess mucus, no productive cough,  ,  No coughing up of blood.  No change in color of mucus.  No wheezing.  No chest wall deformity Positive for non-productive cough  Skin: no rash or lesions.  GU: no dysuria, change in color of urine, no urgency or frequency.  No flank pain, no hematuria   MS:  No joint pain or swelling.  No decreased range of motion.  No back pain.    Physical Exam  BP 122/70 (BP Location: Left Arm, Patient Position: Sitting, Cuff Size: Normal)   Pulse 72   Temp 98.4 F (36.9 C) (Oral)   Ht 5' 2 (1.575 m)   Wt 167 lb (75.8 kg)   SpO2 95%   BMI 30.54 kg/m   GEN: A/Ox3; pleasant , NAD, well nourished    HEENT:  [place]/AT,  EACs-clear, TMs-wnl, NOSE-pale turbinates, clear drainage, THROAT-clear, no lesions, no postnasal drip or exudate noted.   NECK:  Supple w/ fair ROM; no JVD; normal carotid impulses w/o bruits; no thyromegaly or nodules palpated; no lymphadenopathy.    RESP  Clear  P & A; w/o, wheezes/ rales/ or rhonchi. no accessory muscle use, no dullness to percussion  CARD:  RRR, no m/r/g, no peripheral edema, pulses intact, no cyanosis or clubbing.  GI:   Soft & nt; nml bowel sounds; no organomegaly or masses detected.   Musco: Warm bil, no deformities or joint swelling noted.   Neuro: alert, no focal deficits  noted.    Skin: Warm, no lesions or rashes    Lab Results:  CBC    Component Value Date/Time   WBC 11.4 (H) 04/03/2024 1121   RBC 4.81 04/03/2024 1121   HGB 12.6 04/03/2024 1121   HCT 38.9 04/03/2024 1121   PLT 417.0 (H) 04/03/2024 1121   MCV 80.9 04/03/2024 1121   MCH 26.2 02/10/2018 0430   MCHC 32.4 04/03/2024 1121   RDW 15.9 (H) 04/03/2024 1121   LYMPHSABS 1.8 04/03/2024 1121  MONOABS 0.5 04/03/2024 1121   EOSABS 0.4 04/03/2024 1121   BASOSABS 0.1 04/03/2024 1121    BMET    Component Value Date/Time   NA 140 02/10/2018 0430   NA 139 12/06/2017 1600   K 3.4 (L) 02/10/2018 0430   CL 105 02/10/2018 0430   CO2 27 02/10/2018 0430   GLUCOSE 93 02/10/2018 0430   BUN 10 02/10/2018 0430   BUN 11 12/06/2017 1600   CREATININE 0.85 02/10/2018 0430   CALCIUM  8.3 (L) 02/10/2018 0430   GFRNONAA >60 02/10/2018 0430   GFRAA >60 02/10/2018 0430    BNP No results found for: BNP  ProBNP    Component Value Date/Time   PROBNP 83.0 04/03/2024 1121    Imaging: No results found.  Administration History     None          Latest Ref Rng & Units 12/10/2022    8:35 AM 08/17/2022    9:16 AM 01/25/2020    3:06 PM 05/11/2019    1:59 PM 05/18/2018   11:20 AM 03/20/2018    9:11 AM 12/13/2017    9:03 AM  PFT Results  FVC-Pre L 2.00  2.00  2.01  1.76  2.06  2.05  1.65   FVC-Predicted Pre % 72  72  71  61  71  71  58   FVC-Post L      2.16  1.94   FVC-Predicted Post %      75  68   Pre FEV1/FVC % % 83  87  83  82  89  88  85   Post FEV1/FCV % %      92  91   FEV1-Pre L 1.66  1.74  1.67  1.44  1.83  1.80  1.39   FEV1-Predicted Pre % 79  83  78  66  83  82  64   FEV1-Post L      1.99  1.76   DLCO uncorrected ml/min/mmHg 13.91  13.41  15.12  15.51    13.89   DLCO UNC% % 76  73  82  84    66   DLCO corrected ml/min/mmHg 13.91  13.41  15.12     14.35   DLCO COR %Predicted % 76  73  82     68   DLVA Predicted % 101  99  108  115    106     Lab Results  Component Value  Date   NITRICOXIDE 48 11/01/2017     Assessment & Plan:   Assessment & Plan Asthma, cough variant -  Refill Symbicort  and resume; Rx sent to Pharmacy -  Discussed addition of biologic therapy at length -  Patient agreeable to trial of Dupixent; information provided to patient at appt today -  RTC sooner if new or worsening symptoms or issues with Dupixent Eosinophilia, unspecified type - see above Elevated IgE level -  see above 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HCC) -  Continue chronic prednisone  therapy -  Follow up scheduled with Dr. Geronimo in October; PFTs to be completed at that time.    Return as already scheduled in October with Dr. Geronimo.  Candis Dandy, PA-C 04/18/2024

## 2024-04-18 NOTE — Telephone Encounter (Signed)
 Received notification from [facility] regarding a prior authorization for DUPIXENT. Authorization has been APPROVED from 04/18/2024 to 10/19/2024. Approval letter sent to scan center.  Per test claim, copay for 28 days supply is $1,541.96  Patient can fill through [facility]: [phone]   Authorization # [id number]   It does not appear that PAP paperwork was completed during OV, will need to reach out to pt to discuss.

## 2024-04-18 NOTE — Assessment & Plan Note (Signed)
-    Continue chronic prednisone  therapy -  Follow up scheduled with Dr. Geronimo in October; PFTs to be completed at that time.

## 2024-04-25 ENCOUNTER — Other Ambulatory Visit (HOSPITAL_COMMUNITY): Payer: Self-pay

## 2024-04-25 NOTE — Telephone Encounter (Signed)
 Submitted Patient Assistance Application to Dupixent MyWay for DUPIXENT along with provider portion, patient portion, PA, medication list, insurance card copy and income documents. Will update patient when we receive a response.  Phone #: [phone] Fax #: [phone]   Contacted pt and discussed findings as well as going over next steps, informed pt that I would also send her a MyChart message with this information reiterated and will include the phone numbers she needs to contact.

## 2024-05-09 NOTE — Telephone Encounter (Signed)
 Enrolled patient into asthma grant through [employer]: Amount: $2000 Award Period: 11/12/2023 - 05/10/2025 ID: [id number] BIN: [id number] PCN: [id number] Group: [id number] For pharmacy inquiries, contact PDMI at [phone]. For patient inquiries, contact PAF at [phone].  Patient can be scheduled for Dupixent new start  Jansel Vonstein, PharmD, MPH, BCPS, CPP Clinical Pharmacist (Rheumatology and Pulmonology)

## 2024-05-10 MED ORDER — DUPIXENT 300 MG/2ML ~~LOC~~ SOAJ
SUBCUTANEOUS | 0 refills | Status: DC
  Filled 2024-05-14: qty 8, 28d supply, fill #0

## 2024-05-10 NOTE — Telephone Encounter (Signed)
 Pt scheduled for new start DPX on 05/22/24. Aware she will be receiving call from [facility] for onboarding.   Aleck Puls, PharmD, BCPS Clinical Pharmacist  [facility]

## 2024-05-14 ENCOUNTER — Other Ambulatory Visit: Payer: Self-pay

## 2024-05-14 ENCOUNTER — Other Ambulatory Visit (HOSPITAL_COMMUNITY): Payer: Self-pay

## 2024-05-14 NOTE — Progress Notes (Signed)
 Specialty Pharmacy Initial Fill Coordination Note  Sharon [name] is a 73 y.o. female contacted today regarding initial fill of specialty medication(s) Dupilumab  (Dupixent )   Patient requested Courier to Provider Office   Delivery date: 05/16/24   Verified address: [address] [place], [place] [address]   Medication will be filled on 8/19.   Patient is enrolled into [place] and is aware of $0 copayment.

## 2024-05-18 NOTE — Patient Instructions (Addendum)
 Your next Dupixent  dose is due on 06/05/24, 06/19/24, and every 14 days thereafter  CONTINUE Symbicort  80-4.54mcg/act inhaler, 2 puffs twice daily   Your prescription will be shipped from [facility]. Their phone number is [phone]. Someone will call to schedule shipment and confirm address. They will mail your medication to your home.  You will need to be seen by your provider in 3 to 4 months to assess how Dupixent  is working for you. Please keep your follow-up appointment scheduled on 07/02/24 with Dr. Geronimo.   Stay up to date on all routine vaccines: influenza, pneumonia, COVID19, Shingles  How to manage an injection site reaction: Remember the 5 C's: COUNTER - leave on the counter at least 30 minutes but up to overnight to bring medication to room temperature. This may help prevent stinging COLD - place something cold (like an ice gel pack or cold water bottle) on the injection site just before cleansing with alcohol. This may help reduce pain CLARITIN - use Claritin (generic name is loratadine) for the first two weeks of treatment or the day of, the day before, and the day after injecting. This will help to minimize injection site reactions CORTISONE CREAM - apply if injection site is irritated and itching CALL ME - if injection site reaction is bigger than the size of your fist, looks infected, blisters, or if you develop hives

## 2024-05-18 NOTE — Progress Notes (Signed)
 HPI Patient presents today to [facility] to see pharmacy team for Dupixent  new start.  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   Last OV with Candis Dandy on 04/18/24 to follow-up on lab work demonstrating elevated IgE and EOS. At that time, she reported running out of Symbicort  x 1-2 months, relying on albuterol  during that time. Symbicort  was resumed along with plan to start trial of Dupixent .  Respiratory Medications Current regimen: Symbicort  80-4.16mcg/act inhaler 2 puffs twice daily + prednisone  4mg  daily with breakfast  Patient reports {Adherence challenges yes no:[id number]::adherence challenges,no known adherence challenges}  OBJECTIVE Allergies  Allergen Reactions   Codeine  Nausea Only    GI upset     Outpatient Encounter Medications as of 05/22/2024  Medication Sig   albuterol  (VENTOLIN  HFA) 108 (90 Base) MCG/ACT inhaler Inhale into the lungs.   ALPRAZolam  (XANAX ) 0.5 MG tablet TAKE 1/2 TO 1 TABLET BY MOUTH DAILY AS NEEDED FOR ANXIETY. Limit use. Do not mix with tramadol .   aspirin  EC 81 MG tablet Take 81 mg by mouth daily. (Patient not taking: Reported on 04/18/2024)   atorvastatin  (LIPITOR) 10 MG tablet TAKE ONE TABLET BY MOUTH DAILY   brimonidine (ALPHAGAN) 0.2 % ophthalmic solution SMARTSIG:In Eye(s)   budesonide -formoterol  (SYMBICORT ) 80-4.5 MCG/ACT inhaler Inhale 2 puffs into the lungs in the morning and at bedtime.   calcium  carbonate (TUMS EX) 750 MG chewable tablet Chew 1 tablet by mouth daily as needed for heartburn.   celecoxib (CELEBREX) 200 MG capsule Take 200 mg by mouth daily.   clopidogrel  (PLAVIX ) 75 MG tablet Take by mouth. (Patient not taking: Reported on 04/18/2024)   dextromethorphan-guaiFENesin (MUCINEX DM) 30-600 MG per 12 hr tablet Take 1 tablet by mouth 2 (two) times daily as needed (congestion).    diclofenac  (VOLTAREN ) 50 MG EC tablet Take 50 mg by mouth 2 (two) times daily.    dorzolamide-timolol (COSOPT) 22.3-6.8 MG/ML ophthalmic solution  (Patient not taking: Reported on 04/18/2024)   DULoxetine  (CYMBALTA ) 60 MG capsule Take 30 mg by mouth in the morning, at noon, and at bedtime.   Dupilumab  (DUPIXENT ) 300 MG/2ML SOAJ Inject 600mg  in the skin on day 0 in clinic. Then inject 300mg  in the skin every 14 days thereafter. Courier to pulm: [address], [address], [place] [address]. Appt on 05/22/24.   latanoprost  (XALATAN ) 0.005 % ophthalmic solution Place 1 drop into both eyes at bedtime.   losartan  (COZAAR ) 50 MG tablet Take 50 mg by mouth daily.    predniSONE  (DELTASONE ) 1 MG tablet TAKE 4 TABLETS BY MOUTH DAILY WITH BREAKFAST   No facility-administered encounter medications on file as of 05/22/2024.     Immunization History  Administered Date(s) Administered   Fluad Quad(high Dose 65+) 08/07/2019, 07/14/2021, 08/17/2022   Influenza, High Dose Seasonal PF 10/25/2017, 05/28/2020   Influenza, Mdck, Trivalent,PF 6+ MOS(egg free) 08/16/2023   Influenza-Unspecified 09/27/2013   PFIZER(Purple Top)SARS-COV-2 Vaccination 10/03/2019, 10/24/2019, 05/28/2020, 07/14/2021   Pneumococcal Conjugate-13 10/25/2017   Pneumococcal Polysaccharide-23 08/07/2019   Pneumococcal-Unspecified 08/07/2008   Zoster, Live 12/06/2013     PFTs    Latest Ref Rng & Units 12/10/2022    8:35 AM 08/17/2022    9:16 AM 01/25/2020    3:06 PM 05/11/2019    1:59 PM 05/18/2018   11:20 AM 03/20/2018    9:11 AM 12/13/2017    9:03 AM  PFT Results  FVC-Pre L 2.00  2.00  2.01  1.76  2.06  2.05  1.65   FVC-Predicted Pre % 72  72  71  61  71  71  58   FVC-Post L      2.16  1.94   FVC-Predicted Post %      75  68   Pre FEV1/FVC % % 83  87  83  82  89  88  85   Post FEV1/FCV % %      92  91   FEV1-Pre L 1.66  1.74  1.67  1.44  1.83  1.80  1.39   FEV1-Predicted Pre % 79  83  78  66  83  82  64   FEV1-Post L      1.99  1.76   DLCO uncorrected ml/min/mmHg 13.91  13.41  15.12  15.51    13.89   DLCO UNC%  % 76  73  82  84    66   DLCO corrected ml/min/mmHg 13.91  13.41  15.12     14.35   DLCO COR %Predicted % 76  73  82     68   DLVA Predicted % 101  99  108  115    106      Eosinophils Most recent blood eosinophil count was 400 cells/microL taken on 04/03/24.   IgE: 516 on 04/03/24   Assessment   Biologics training for dupilumab  (Dupixent )  Goals of therapy: Mechanism: human monoclonal IgG4 antibody that inhibits interleukin-4 and interleukin-13 cytokine-induced responses, including release of proinflammatory cytokines, chemokines, and IgE Reviewed that Dupixent  is add-on medication and patient must continue maintenance inhaler regimen. Response to therapy: may take 4 months to determine efficacy. Discussed that patients generally feel improvement sooner than 4 months.  Side effects: injection site reaction (6-18%), antibody development (5-16%), ophthalmic conjunctivitis (2-16%), transient blood eosinophilia (1-2%)  Dose: 600mg  at Week 0 (administered today in clinic) followed by 300mg  every 14 days thereafter  Administration/Storage:  Reviewed administration sites of thigh or abdomen (at least 2-3 inches away from abdomen). Reviewed the upper arm is only appropriate if caregiver is administering injection  Do not shake pen/syringe as this could lead to product foaming or precipitation. Do not use if solution is discolored or contains particulate matter or if window on prefilled pen is yellow (indicates pen has been used).  Reviewed storage of medication in refrigerator. Reviewed that Dupixent  can be stored at room temperature in unopened carton for up to 14 days.  Access: Approval of Dupixent  through: insurance and grant  Patient self-administered Dupixent  300mg /77ml x 2 (total dose 600mg ) in {injsitedsg:28167} and {injsitedsg:28167} using sample  Dupixent  300mg /27mL autoinjector pen NDC: *** Lot: *** Expiration: ***  Patient monitored for 30 minutes for adverse reaction.  Patient  tolerated ***.  Injection site noted. {injectionreaction:30756}  Medication Reconciliation  A drug regimen assessment was performed, including review of allergies, interactions, disease-state management, dosing and immunization history. Medications were reviewed with the patient, including name, instructions, indication, goals of therapy, potential side effects, importance of adherence, and safe use.  Drug interaction(s): ***   PLAN Continue Dupixent  300mg  every 14 days.  Next dose is due 06/04/24 and every 14 days thereafter. Rx sent to: [facility]: [phone] .  Patient provided with pharmacy phone number.  Continue maintenance inhaler regimen of: Symbicort  80-4.64mcg/act inhaler 2 puffs twice daily   All questions encouraged and answered.  Instructed patient to reach out with any further questions or concerns.  Thank you for allowing pharmacy to participate in  this patient's care.  This appointment required 45 minutes of patient care (this includes precharting, chart review, review of results, face-to-face care, etc.).

## 2024-05-22 ENCOUNTER — Ambulatory Visit: Admitting: Pharmacist

## 2024-05-22 DIAGNOSIS — J45991 Cough variant asthma: Secondary | ICD-10-CM | POA: Diagnosis not present

## 2024-05-22 DIAGNOSIS — Z7189 Other specified counseling: Secondary | ICD-10-CM

## 2024-05-25 ENCOUNTER — Other Ambulatory Visit: Payer: Self-pay

## 2024-05-25 MED ORDER — DUPIXENT 300 MG/2ML ~~LOC~~ SOAJ
300.0000 mg | SUBCUTANEOUS | 2 refills | Status: DC
  Filled 2024-05-25 – 2024-06-25 (×2): qty 4, 28d supply, fill #0
  Filled 2024-07-25: qty 4, 28d supply, fill #1
  Filled 2024-08-20: qty 4, 28d supply, fill #2

## 2024-05-25 NOTE — Progress Notes (Signed)
 73 YOF starting Dupixent , first dose in clinic on 05/22/24. See Clinical Support note 05/22/24 for details. Educated provided at time of OV 05/22/24.   Aleck Puls, PharmD, BCPS Clinical Pharmacist  [facility]

## 2024-05-29 ENCOUNTER — Ambulatory Visit (HOSPITAL_BASED_OUTPATIENT_CLINIC_OR_DEPARTMENT_OTHER)
Admission: RE | Admit: 2024-05-29 | Discharge: 2024-05-29 | Disposition: A | Discharge status: Home / Self Care | Source: Ambulatory Visit | Attending: Internal Medicine | Admitting: Internal Medicine

## 2024-05-29 DIAGNOSIS — Z9109 Other allergy status, other than to drugs and biological substances: Secondary | ICD-10-CM | POA: Diagnosis present

## 2024-05-29 DIAGNOSIS — T7849XA Other allergy, initial encounter: Secondary | ICD-10-CM | POA: Diagnosis present

## 2024-05-29 DIAGNOSIS — J8489 Other specified interstitial pulmonary diseases: Secondary | ICD-10-CM | POA: Insufficient documentation

## 2024-05-29 DIAGNOSIS — J849 Interstitial pulmonary disease, unspecified: Secondary | ICD-10-CM | POA: Insufficient documentation

## 2024-05-29 DIAGNOSIS — R0609 Other forms of dyspnea: Secondary | ICD-10-CM | POA: Diagnosis present

## 2024-05-29 DIAGNOSIS — R062 Wheezing: Secondary | ICD-10-CM | POA: Insufficient documentation

## 2024-05-29 DIAGNOSIS — R768 Other specified abnormal immunological findings in serum: Secondary | ICD-10-CM | POA: Insufficient documentation

## 2024-05-29 DIAGNOSIS — D721 Eosinophilia, unspecified: Secondary | ICD-10-CM | POA: Diagnosis present

## 2024-06-01 NOTE — Progress Notes (Signed)
 Stabl ILD x 1 year

## 2024-06-05 ENCOUNTER — Other Ambulatory Visit: Payer: Self-pay

## 2024-06-22 ENCOUNTER — Other Ambulatory Visit: Payer: Self-pay

## 2024-06-25 ENCOUNTER — Other Ambulatory Visit: Payer: Self-pay

## 2024-06-27 ENCOUNTER — Other Ambulatory Visit: Payer: Self-pay

## 2024-06-27 NOTE — Progress Notes (Signed)
 Specialty Pharmacy Refill Coordination Note  Sharon [name] is a 73 y.o. female contacted today regarding refills of specialty medication(s) Dupilumab  (Dupixent )   Patient requested Delivery   Delivery date: 07/05/24   Verified address: [address] [place], [address]   Medication will be filled on 07/04/24.

## 2024-07-02 ENCOUNTER — Ambulatory Visit: Admitting: Internal Medicine

## 2024-07-02 ENCOUNTER — Encounter: Payer: Self-pay | Admitting: Internal Medicine

## 2024-07-02 ENCOUNTER — Ambulatory Visit: Admitting: *Deleted

## 2024-07-02 VITALS — BP 132/70 | HR 67 | Temp 97.9°F | Ht 62.0 in | Wt 164.5 lb

## 2024-07-02 DIAGNOSIS — Z23 Encounter for immunization: Secondary | ICD-10-CM | POA: Diagnosis not present

## 2024-07-02 DIAGNOSIS — R7689 Other specified abnormal immunological findings in serum: Secondary | ICD-10-CM

## 2024-07-02 DIAGNOSIS — J849 Interstitial pulmonary disease, unspecified: Secondary | ICD-10-CM

## 2024-07-02 DIAGNOSIS — I272 Pulmonary hypertension, unspecified: Secondary | ICD-10-CM

## 2024-07-02 DIAGNOSIS — R52 Pain, unspecified: Secondary | ICD-10-CM

## 2024-07-02 DIAGNOSIS — J8489 Other specified interstitial pulmonary diseases: Secondary | ICD-10-CM

## 2024-07-02 DIAGNOSIS — Z9109 Other allergy status, other than to drugs and biological substances: Secondary | ICD-10-CM

## 2024-07-02 DIAGNOSIS — Z5181 Encounter for therapeutic drug level monitoring: Secondary | ICD-10-CM | POA: Diagnosis not present

## 2024-07-02 DIAGNOSIS — J84113 Idiopathic non-specific interstitial pneumonitis: Secondary | ICD-10-CM | POA: Diagnosis not present

## 2024-07-02 DIAGNOSIS — Z7952 Long term (current) use of systemic steroids: Secondary | ICD-10-CM

## 2024-07-02 DIAGNOSIS — J45991 Cough variant asthma: Secondary | ICD-10-CM | POA: Diagnosis not present

## 2024-07-02 DIAGNOSIS — R0609 Other forms of dyspnea: Secondary | ICD-10-CM | POA: Diagnosis not present

## 2024-07-02 DIAGNOSIS — D721 Eosinophilia, unspecified: Secondary | ICD-10-CM

## 2024-07-02 DIAGNOSIS — T7849XA Other allergy, initial encounter: Secondary | ICD-10-CM

## 2024-07-02 DIAGNOSIS — R062 Wheezing: Secondary | ICD-10-CM

## 2024-07-02 LAB — PULMONARY FUNCTION TEST
DL/VA % pred: 85 %
DL/VA: 3.57 ml/min/mmHg/L
DLCO cor % pred: 61 %
DLCO cor: 11.14 ml/min/mmHg
DLCO unc % pred: 61 %
DLCO unc: 11.14 ml/min/mmHg
FEF 25-75 Pre: 2.59 L/s
FEF2575-%Pred-Pre: 153 %
FEV1-%Pred-Pre: 90 %
FEV1-Pre: 1.82 L
FEV1FVC-%Pred-Pre: 114 %
FEV6-%Pred-Pre: 81 %
FEV6-Pre: 2.1 L
FEV6FVC-%Pred-Pre: 105 %
FVC-%Pred-Pre: 78 %
FVC-Pre: 2.1 L
Pre FEV1/FVC ratio: 87 %
Pre FEV6/FVC Ratio: 100 %

## 2024-07-02 NOTE — Progress Notes (Signed)
 OV 11/01/2017 - ILD clinic   - transfer of care and 2nd opinion  Chief Complaint  Patient presents with   Advice Only    Referred by Sharon [name] due to an abnormal CT.  Pt does have complaints of a dry cough and SOB with exertion or if has a coughing episode.    73 year old female referred by the primary care physician to the ILD clinic for evaluation of her pulmonary problems with symptoms of cough and shortness of breath.  According to the patient for a better part of 2 years she has had episodic cough particularly in the fall season.  She says she is active in the outdoors doing gardening and cutting and blowing and mowing and she usually wears a mask but nevertheless she will get a bronchitis episode that will require nebulizers and prednisone  to resolve.  Symptoms will usually resolve over 2-3 weeks.  However this time around it is taken 8 weeks to resolve.  Last prednisone  was over a week or 2 ago.  She still has some ongoing wheezing.  In the background of this episodic cough she says between episodes she does not have any cough but she does have some baseline shortness of breath when she climbs a flight of stairs this is been stable and is of insidious onset also present for 2 years.  She did have a CT scan of the chest approximately 9 months ago in May 2018 that shows in my personal opinion based upon my personal visualization bilateral subpleural reticulation particularly in the upper lobes and some subpleural reticulation in the left lower lobe.  There is no clear distinct craniocaudal gradient in my opinion this would be indeterminate for UIP without a clear alternate etiology.  Given the presence of ILD findings she has been sent to the ILD clinic.  Review of the chart also shows she is seen.my colleague Sharon [name] for asthma symptoms and is been placed on Symbicort  which she takes 1 puff twice daily.  The exam nitric oxide  a week after prednisone  and while on Symbicort  is elevated to near  abnormal levels at 48 ppb today  [employer] of chest physicians interstitial lung disease questionnaire -Past medical history: Positive for allergies.  IgE was elevated to 168 approximately a year or 2 ago on chart review.  In addition she gives a history of rheumatoid arthritis diagnosed many many years ago.  Seen by Sharon [name] at that time.  She is only followed up with nonsteroidal anti-inflammatory drugs.  She has not followed up with Sharon [name] in many years.  She feels she needs to go back.  However in May 2017 her autoimmune limited profile done here was negative.  -Personal exposure history: She has smoked marijuana in the past.  She smokes cigarettes from age 13 to age 46 a pack a day and quit.  -Family history of lung disease: Sister Sharon [name] who lives in [place]  apparently has had a lung biopsy and has been diagnosed with sarcoidosis recently.  She is also a smoker.  Father died of congestive heart failure.  There is no diagnosis of pulmonary fibrosis or hypersensitivity pneumonitis  -Home exposure history: Has not lived in a house that is old in the last 10 years.  There is no humidifier or insomnia or hot tub or Jacuzzi or water damage or mold.  She has 1 dog.  She does not have any birds.  She does not use any feathered pillows.  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 Range 02/03/2016 11:20  Anit Nuclear Antibody(ANA) Latest Ref Range: NEGATIVE  NEG  Cyclic Citrullin Peptide Ab Latest Units: Units <16  RA Latex Turbid. Latest Ref Range:  <=14 IU/mL <10    Walking desaturation test on 11/01/2017 185 feet x 3 laps on ROOM AIR:  did not desaturate. Rest pulse ox was 100%, final pulse ox was 97%. HR response 88/min at rest to 100/min at peak exertion. Patient Sharon [name]  Did not Desaturate < 88% . Sharon [name] yes did  Desaturated </= 3% points. Sharon [name] yes did get tachyardic   FeNO - 48ppb and almost abnormal   OV 12/13/2017  Chief Complaint  Patient presents with   Follow-up    PFT done today. Pt states after last visit on 11/01/17, she developed bronchitis and had it x2 weeks. Pt states she still has a mild cough. Denies any SOB or CP.   Follow-up interstitial lung disease with asthma/allergy  phenotype  She returns for follow-up after investigations.  [name] who is well-known to me through working to medical ICU where she is a Designer, jewellery.  We did a history read taken patient tells me that she moved from [place]  to [place]  many years ago.  She does regular gardening and works in the yard [employer].  Many times the leads are damp and there is she suspects mold in it.  She also burns the lesion is exposed to the smoke.  Symbicort  is helping but approximately 2 weeks ago had respiratory exacerbation that was not related to gardening [in fact she has not gotten this whole year] and ended up in the ER treated with antibiotics and prednisone .  Currently back to baseline.  She had pulmonary function test today that shows mixed obstruction and restriction associated with flow volume loop abnormalities.  She had a hypersensitivity pneumonitis panel documented below that is negative.  She had limited autoimmune panel [the CMA at last visit did not order the full panel] and this was normal.  She had repeat CT scan of the chest read by thoracic radiology I personally visualized this and agree with the findings.  It is indeterminate for UIP and there is no specific alternate  pattern.  No  air trapping is described.  The differential diagnosis appears to be broad.     Results for Sharon, [name] (MRN [id number]) as of 12/13/2017 09:57  Ref. Range 11/01/2017 16:22  Faenia retivirgula Latest Ref Range: NEGATIVE  NEGATIVE  S. VIRIDIS Latest Ref Range: NEGATIVE  NEGATIVE  T. CANDIDUS Latest Ref Range: NEGATIVE  NEGATIVE  T. VULGARIS Latest Ref Range: NEGATIVE  NEGATIVE    Results for Sharon, [name] (MRN [id number]) as of 12/13/2017 09:57  Ref. Range 11/01/2017 16:22  Anit Nuclear Antibody(ANA) Latest Ref Range: NEGATIVE  NEGATIVE  Angiotensin-Converting Enzyme Latest Ref Range: 9 - 67 U/L 25  Cyclic Citrullin Peptide Ab Latest Units: UNITS <16  ds DNA Ab Latest Units: IU/mL <1  RA Latex Turbid. Latest Ref Range: <14 IU/mL <  14  IgE (Immunoglobulin E), Serum Latest Ref Range: <OR=114 kU/L 252 (H)    IMPRESSION: CT FEb 2019 1. Pulmonary parenchymal pattern of fibrotic interstitial lung disease is unchanged from 02/07/2017 and may be due to nonspecific interstitial pneumonitis or mild chronic hypersensitivity pneumonitis. Findings are not consistent with usual interstitial pneumonitis. 2. Aortic atherosclerosis (ICD10-170.0). Coronary artery calcification.     Electronically Signed   By: Newell Eke M.D.   On: 11/14/2017 12:56   OV 02/23/2018  Chief Complaint  Patient presents with   Follow-up    Pt had lung biopsy done and states she is doing good since then. States only time she has pain is when she sneezes. Denies any current complaints of cough, SOB, or CP.    Here to review SLB results -slides have been read by our resident pathologist with whom I discussed via email.  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  In addition he did see some eosinophilic infiltrates in the interstitium.  This can explain the high nitric oxide  that she has.  She is here with her husband.  Since surgery  she is doing well.  She only has some pain in her chest when she sneezes at the postsurgical site.  She still has one suture hanging out which Dr. Kerrin said was okay to remove and my LPN removed it.  But overall he she and her husband are here to discuss the pathology report.  They want to know prognosis and implications.  Her grandchild is now 52 days old and she wants to spend a lot of time taking care of the grandchild.  She is aware of prednisone  side effects.  Her husband says that her mood might change because of prednisone  but patient herself says she has tolerated prednisone  just fine in the past.     OV 03/23/2018  Chief Complaint  Patient presents with   Follow-up    PFT performed 6/24.    Pt states she has been doing well since last visit. States only complaint is fatigue in the afternoons.    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 Remot hx of RA Rx with NSAID but 2019 - autoimmune panel negative. Started prednisone  02/23/18   Ms. Marston is here to follow-up for her interstitial lung disease as above. She tells me that she is going to start 40 mg prednisone  tomorrow. Meanwhile the prednisone  has caused her to have 10 pound weight gain. Her clothes are barely fitting her. She is also having acidity and acid reflux for which she is not on treatment at this point. Otherwise overall she is tolerating things fine. There are no other new issues. She had pulmonary function test today and it shows 11% improvement in the postbronchodilator FVC. She feels her dyspnea has improved         OV 08/07/2019  Subjective:  Patient ID: Sharon [name], female , DOB: 1951/06/16 , age 68 y.o. , MRN: [id number] , ADDRESS: [address] 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 Remot hx of RA Rx with NSAID but 2019 - autoimmune panel negative. Started prednisone  02/23/18 08/07/2019 -   Chief Complaint   Patient presents with   Follow-up    Pt states she has been doing well since last visit and denies any complaints with breathing.     HPI ICESS BERTONI 73 y.o. -last seen in May 2019.  She saw  our nurse practitioner in June 2020 and because she was doing well prednisone  was tapered and stopped.  But this then resulted in worsening shortness of breath and chest tightness.  She visited the beach in the summer and she found it very difficult to even walk short distances.  This because of shortness of breath and relieved by rest.  Then in August 2020 she saw a nurse practitioner again and had pulmonary function test which is shown below.  It declined.  She went back on prednisone  and is currently at 10 mg/day.  She feels back at baseline.  She says she does not have any organic antigen or mold exposure.  She has a Human resources officer.  There is no mold in the house.  No birds in the house.  Walking desaturation test today shows baseline normal performance.  However she tells me that when she climbs stairs she feels very short of breath.  She does have associated obesity. She wants to have flu and pneumovax   She is also complaining of chronic right infraaxillary right paralumbar region pain.  She says she has had previous multiple lipomas with excision.  She feels its tender to touch.  There is no urinary discomfort.  There is no fever or rash in the area.  This is a longstanding and intermittent.  There is no back pain.  After she left I noted that CT scan three-vessel coronary artery calcification.  I do not know if she has had a stress test.    HRCT Aug 2020  IMPRESSION: 1. There is a redemonstrated pattern of apical predominant mild pulmonary fibrosis featuring mild traction bronchiectasis, subpleural irregular ground-glass and occasional areas of bronchiolectasis with some elements of peribronchovascular ground-glass and fibrotic architectural distortion, particular in the left upper  lobe. There is evidence of interval left lung wedge biopsies. No evidence of air trapping on expiratory phase imaging. Findings are not significantly changed compared to prior examinations dating back to 02/07/2017 and remain in an alternate diagnosis pattern by ATS pulmonary fibrosis criteria, consistent with histologic diagnosis favoring NSIP.   2.  Stable, benign small pulmonary nodules.   3.  Coronary artery disease and aortic atherosclerosis.     Electronically Signed   By: Marolyn Jaksch M.D.   On: 05/01/2019 13:25    IMPRESSION: 1. There is a redemonstrated pattern of apical predominant mild pulmonary fibrosis featuring mild traction bronchiectasis, subpleural irregular ground-glass and occasional areas of bronchiolectasis with some elements of peribronchovascular ground-glass and fibrotic architectural distortion, particular in the left upper lobe. There is evidence of interval left lung wedge biopsies. No evidence of air trapping on expiratory phase imaging. Findings are not significantly changed compared to prior examinations dating back to 02/07/2017 and remain in an alternate diagnosis pattern by ATS pulmonary fibrosis criteria, consistent with histologic diagnosis favoring NSIP.   2.  Stable, benign small pulmonary nodules.   3.  Coronary artery disease and aortic atherosclerosis.     Electronically Signed   By: Marolyn Jaksch M.D.   On: 05/01/2019 13:25   ROS - per HPI     has a past medical history of Anemia, Chronic major depressive disorder, DDD (degenerative disc disease), cervical, Depression, Dyspnea, Hypertension, Interstitial lung disease (HCC), Mixed hyperlipidemia, Pain in joints of both feet, Pneumonia (2016), Polyarthropathy, Stroke [facility]), Vision abnormalities, and Vitamin D deficiency.    OV 02/14/2020  Subjective:  Patient ID: Sharon [name], female , DOB: 1951/08/16 , age 20 y.o. , MRN: [id number] ,  ADDRESS: [address] 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 Remot hx of RA Rx with NSAID but 2019 - autoimmune panel negative. 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  Treatment consideration is prednisone  but if progresses then add antifibrotic.   -Course is overall of 1 of stability.  In the summer 2020 did get worse in the absence of prednisone  but improved with prednisone .  #Normal stress test November 2020   02/14/2020 -   Chief Complaint  Patient presents with   Follow-up    SOB with walking, no coughing. doing well.      HPI Sharon [name] 73 y.o. -presents for ILD follow-up.  After the last visit we did discuss her again at the case conference in December 2020.  NSIP is considered the leading diagnosis.  She is doing well on prednisone  10 mg/day.  She says the dyspnea is stable.  Although the symptom score maybe it is a little bit worse walking desaturation test is stable.  Pulmonary function test is stable/improved.  She has had a Covid vaccine.  She prefers to take prednisone  at 10 mg/day.  She does not want to taper.  She is having some back pain.  She says primary care physician [name] Ozell BROCKS, MD is monitoring her bone health.  Her recent cardiac stress test was normal.  I reviewed the results.   Results for ZURIAH, BORDAS (MRN [id number]) as of 02/14/2020 10:07  Ref. Range 12/13/2017 09:03 03/20/2018 09:11 05/18/2018 11:20 05/11/2019 13:59 01/25/2020 15:06  FVC-Pre Latest Units: L 1.65 2.05 2.06 1.76 -worsae without pred 2.01   Results for MADDALYNN, BARNARD (MRN [id number]) as of 02/14/2020 10:07  Ref. Range 12/13/2017 09:03 03/20/2018 09:11 05/18/2018 11:20 05/11/2019 13:59 01/25/2020 15:06  DLCO unc Latest Units: ml/min/mmHg 13.89   15.51 15.12  DLCO unc % pred Latest Units: % 66   84 82        OV 03/17/2021  Subjective:  Patient ID: Sharon [name], female , DOB: [birth year] , age 71 y.o. , MRN: [id number] , ADDRESS: [address] [place] [address] PCP [name] Ozell BROCKS, MD Patient Care Team: [name] Ozell BROCKS, MD as PCP - General (Family Medicine)  This Provider for this visit: Treatment Team:  Attending Provider: Geronimo Amel, MD    03/17/2021 -   Chief Complaint  Patient presents with   Follow-up    Pt states she was doing well up until 4 weeks ago. States 4 weeks ago she developed a head cold which then went in her chest and was prescribed abx and she states she is still having postnasal drainage and a cough.    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 Remot hx of RA Rx with NSAID but 2019 - autoimmune panel negative. 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  Treatment consideration is prednisone  but if progresses then add antifibrotic.   -Course is overall of 1 of stability.  In the summer 2020 did get worse in the absence of prednisone  but improved with prednisone .  #Normal stress test November 2020  HPI ADRIEANA FENNELLY 73 y.o. -last seen over a year ago.  She says she is doing really well from a ILD perspective and overall health is good.  Then approximately 1 month ago she feels she picked  up a head cold.  She is describing a sinus infection.  She was given erythromycin and then seem to get better but then a few weeks ago started having significant cough and mucus production.  But no change in shortness of breath.  PCP then gave her 12-day prednisone  that ended yesterday.  She feels the cough is better but then she started having new onset of frequent sinus drainage that is green ears are plugged particularly the right ear [there is wax on the exam of the right ear].  Nevertheless she feels highly stable.  Primary care felt that she needed to be seen by pulmonary.  Her last  pulmonary function testing was a year ago and last CT scan was 2 years ago.   No results found.    PFT   OV 05/26/2021  Subjective:  Patient ID: Sharon [name], female , DOB: 12-Oct-1950 , age 18 y.o. , MRN: [id number] , ADDRESS: [address] [place] [address] PCP [name] Ozell BROCKS, MD Patient Care Team: [name] Ozell BROCKS, MD as PCP - General (Family Medicine)  This Provider for this visit: Treatment Team:  Attending Provider: Geronimo Amel, MD    05/26/2021 -   Chief Complaint  Patient presents with   Follow-up    Pt states she has been doing okay since last visit and denies any complaints.   0 H0PI Sharon [name] 73 y.o. -continues to do well.  Symptom score is stable between June 2022 and now.  She had high-resolution CT chest in August 2022 shows 2-year stability.  I discussed this result to her.  She was supposed to have pulmonary function test but she forgot to schedule that.  We do not have the data but she feels stable and the CT scan is stable.  She continues on prednisone  10 mg/day.  She is tolerating it well but we discussed about possible reduction in dose she is open to this idea.  We did notice that 2 years ago when she stopped prednisone  she started doing poorly with lung function.  Therefore we feel there is a floor dose which she needs.  We agreed that we would slowly reduce the prednisone     CT Chest data Aug  2022  Narrative & Impression  CLINICAL DATA:  Interstitial lung disease.   EXAM: CT CHEST WITHOUT CONTRAST   TECHNIQUE: Multidetector CT imaging of the chest was performed following the standard protocol without intravenous contrast. High resolution imaging of the lungs, as well as inspiratory and expiratory imaging, was performed.   COMPARISON:  05/01/2019.   FINDINGS: Cardiovascular: Atherosclerotic calcification of the aorta and coronary arteries. Heart is at the upper limits of normal in size. No pericardial effusion.    Mediastinum/Nodes: No pathologically enlarged mediastinal or axillary lymph nodes. Hilar regions are difficult to definitively evaluate without IV contrast but appear grossly unremarkable. Esophagus is grossly unremarkable. Small hiatal hernia.   Lungs/Pleura: Upper and midlung zone predominant interstitial coarsening, traction bronchiectasis/bronchiolectasis, ground-glass and subpleural reticular densities, unchanged from 05/01/2019. Postoperative scarring in the left upper and left lower lobes. 4 mm peripheral right upper lobe nodule (9/48), unchanged and benign. There is air trapping. No pleural fluid. Airway is unremarkable.   Upper Abdomen: Low-attenuation lesions in the liver measure up to 1.6 cm and are likely cysts. Visualized portions of the liver, gallbladder, adrenal glands, kidneys, spleen, pancreas, stomach and bowel are otherwise unremarkable with the exception of a small hiatal hernia.   Musculoskeletal: No worrisome lytic or sclerotic  lesions.   IMPRESSION: 1. Pulmonary parenchymal pattern of fibrosis, as described above, unchanged from 05/01/2019 and compatible with biopsy-proven fibrotic nonspecific interstitial pneumonitis. Findings are suggestive of an alternative diagnosis (not UIP) per consensus guidelines: Diagnosis of Idiopathic Pulmonary Fibrosis: An Official ATS/ERS/JRS/ALAT Clinical Practice Guideline. Am JINNY [employer] Crit Care Med Vol 198, Iss 5, [phone], May 28 2017. 2. Aortic atherosclerosis (ICD10-I70.0). Coronary artery calcification.     Electronically Signed   By: Newell Eke M.D.   On: 05/04/2021 13:58        OV 08/17/2022  Subjective:  Patient ID: Sharon [name], female , DOB: 02/21/1951 , age 16 y.o. , MRN: [id number] , ADDRESS: [address] [place] [address] PCP [name] Ozell BROCKS, MD Patient Care Team: [name] Ozell BROCKS, MD as PCP - General (Family Medicine)  This Provider for this visit: Treatment Team:  Attending  Provider: Geronimo Amel, MD    08/17/2022 -   Chief Complaint  Patient presents with   Follow-up    PFT performed today.  Pt states she has been doing okay since last visit and denies any complaints.      HPI Sharon [name] 73 y.o. -returns for follow-up.  I personally saw her in August 2022.  She was supposed to see me back in 6 months but she has not.  She has been busy with her grandkids.  Her daughter-in-law Sharon [name] is expecting her fourth child.  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  In any event she is now on prednisone  8 mg/day.  She has gained a lot of weight.  She does not attribute this to prednisone  but being sedentary.  She says that in the summer 2023 she did end up with back muscle strain?  Gluteus maximtis s and was on 6-week prednisone  taper but is now back to baseline of 8 mg/day.  During this time she also felt she had a lot of cough and respiratory exacerbation but it appears that this is resolved.  She continues on Symbicort .  She takes Mucinex.  With the weight gain she is feeling more short of breath.  In fact her symptoms.  Worse.  Stairs are difficult.  Walking desaturation test is the same.  However she did feel dyspneic walking the third lab which she never used in the past.     PFT   OV 12/14/2022  Subjective:  Patient ID: Sharon [name], female , DOB: January 11, 1951 , age 74 y.o. , MRN: [id number] , ADDRESS: [address] [place] [address] PCP [name] Ozell BROCKS, MD Patient Care Team: [name] Ozell BROCKS, MD as PCP - General (Family Medicine)  This Provider for this visit: Treatment Team:  Attending Provider: Geronimo Amel, MD    12/14/2022 -   Chief Complaint  Patient presents with   Follow-up    Pft review, ct review, no other concerns     HPI Sharon [name] 73 y.o. -returns for follow-up.  Husband is here with her but he is not an independent historian.  At this point in time-she is on a gradual reduction of prednisone   to 5 mg/day.  My plan was to keep.  Prednisone  5 mg/day.  This is because in December 2020 she flared up without prednisone  and a pulmonary function test dropped and we had to go back on prednisone .  However she tells me that 3 days ago she ran out of prednisone .  She did have a prednisone  burst in between because of right  hip bursitis.  She is feeling stable without any decline in respiratory symptoms or steroid withdrawal.  However I did clarify that our intention was to keep her on daily prednisone .  She is open to this idea.  She only ran out of it 3 days ago.  She is willing to try a lower dose of prednisone  at 4 mg/day.  Of note her daughter-in-law Sharon [name] who was an ICU nurse at [employer] had a fourth baby yesterday.  The baby's name is Caitlin.  She is pretty excited.  The oldest grandchild is 72 years old now.      OV 04/03/2024  Subjective:  Patient ID: Sharon [name], female , DOB: November 19, 1950 , age 62 y.o. , MRN: [id number] , ADDRESS: [address] [place] [address] PCP [name] Ozell BROCKS, MD Patient Care Team: [name] Ozell BROCKS, MD as PCP - General (Family Medicine)  This Provider for this visit: Treatment Team:  Attending Provider: Geronimo Amel, MD    04/03/2024 -   Chief Complaint  Patient presents with   Follow-up    Sob with exertion and at rest.  Prednisone  is not helping.     HPI Sharon [name] 73 y.o. -returns for follow-up.  Presents with her husband.  He is an independent historian.  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  They have 4 kids.  The youngest 5 is 64-year-old and the oldest 54 is 72 years old.  Patient states that the grandkids keep her busy but when they leave she gets exhausted.  She feels she is more dyspneic than 5 years ago but stable in the last 1 year.  Now that she feels she is not able to do the things she likes such as do gardening or even walk around the block.  Symptom scores  appear stable although she feels she is getting worse.  She also says she has began to have a cough.  This on the symptom score appears to be true.  On exam today she had bilateral upper lobe crackles but she also had scattered wheezes and squeaks in the lung.  No hospitalizations no ER visits no urgent care visits no surgeries in the last year and 3 of 44-month since I last saw her.  Review of the records indicate that she has positive RAST antibody panel for dust mite.  She also has elevated IgE and blood eosinophils.  I presented this to her.  She did not remember these details.  The dog is no past.  The pillows are old but there are no feather pillow or down pillow or down jackets in the house.  They do use high and air filters in the house.  We talked about dust mite allergy  control and rechecking this.     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  BNP and ANA were negative, however IgE was elevated at 516 and eos were >300.  She reports that she has been out of her Symbicort  for 1-2 months and has noticed a more prominent cough since that.  She has been using her Albuterol  in  the meantime for this.  She denies chest pain, fever, chills, productive cough, worsening dyspnea.  She does report a runny nose and feeling that she has congestion in her ears at times.    Rx  -= dupixent    OV 07/02/2024  Subjective:  Patient ID: Sharon [name], female , DOB: 09-24-1951 , age 69 y.o. , MRN: [id number] , ADDRESS: [address] [place] [address] PCP [name] Ozell BROCKS, MD Patient Care Team: [name] Ozell BROCKS, MD as PCP - General (Family Medicine)  This Provider for this visit: Treatment Team:  Attending Provider: Geronimo Amel,  MD    07/02/2024 -   Chief Complaint  Patient presents with   Interstitial Lung Disease    PFT & CT review. SOB during exertion.     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 Remot hx of RA Rx with NSAID but 2019 - autoimmune panel negative. 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  Treatment consideration is prednisone  but if progresses then add antifibrotic.  -Prednisone  lowered from 10 mg/day to 8 mg/day in August 2022. >  Lowered to 5 mg/day of prednisone  by February 2024- > 4 mg/day as of July 2025.   -Course is overall of 1 of stability.  In the summer 2020 did get worse in the absence of prednisone  but improved with prednisone . ' # Coronary artery calcification #Normal stress test November 2020  - last echo 2019  - normal BNP July 2025  # Therapeutic monitoring for steroid side effects  HPI Sharon [name] 73 y.o. -returns for follow-up.  I saw her in July 2025.  She was having fair amount of symptoms.  Differential diagnosis was either activation of allergies [high eosinophils, high IgE and dust mite allergy ] versus progression of fibrosis.  Her blood eosinophils came back high she saw nurse practitioner and she is being started on Dupixent .  She has only taken 2 doses of Dupixent  at this point.  She says sneezing is better but that so far there is no impact on shortness of breath.  She has a new complaint of pain at the injection site of her thigh.  The way she described it I believe she is giving herself the Dupixent  IM instead of subcutaneous.  I contacted Aleck Puls our pharmacist to do reeducation.     Independent of that in terms of ILD shortness of breath is the same she states.  Pulmonary function test FVC is improved but DLCO is somewhat decline and therefore mixed picture.  High-resolution CT chest personally visualized and it is  stable compared to a year and a half ago.  She continues on chronic daily low-dose prednisone   BNP this year is normal MPO antibody this year is normal.  She will have a high-dose flu shot today.   SYMPTOM SCALE - ILD 08/07/2019  02/14/2020  03/17/2021  05/26/2021  08/17/2022 167# 04/03/2024 165# Pred 4mg  per day 07/02/2024 Dupixent   Daily pred  O2 use r        Shortness of Breath 0 -> 5 scale with 5 being worst (score 6 If unable to do)        At rest 0 0 0 0 0 0 1  Simple tasks - showers, clothes change, eating, shaving 0 0 0 0 1 0 1  Household (dishes, doing bed, laundry) 2 3 2  0 4 1 3   Shopping 1 2 0 0 4 1  4  Walking level at own pace 0 2 2 3 4 5 1   Walking up Stairs 5 5 3 4 5 5 4   Total (40 - 48) Dyspnea Score 8 12 7 7 18 11 14   How bad is your cough? 0 0 2.5 0 0 1 0  How bad is your fatigue 0 0 3 4 3 5 5   nausea  0 0 0 0 0 0  vomit  0 0 0 0 0 0  diarhea  00 0 0 0 0 0  anzity  0 0 0 0 0 5  depression  3 3 2 3 3 5        Simple office walk 185 feet x  3 laps goal with forehead probe 02/23/2018  08/07/2019  02/14/2020  03/17/2021  08/17/2022  04/03/2024   O2 used Room air Room air Room air ra ra   Number laps completed 3 3 3 3 3    Comments about pace slow Normal pace Nl pace avg    Resting Pulse Ox/HR 100% and 71/min 100% and 68 100% and 84/min 100% and 78 100% and HR 71   Final Pulse Ox/HR 99% and 89/min 99% and 87/min 100% and 96/min 99% and 97 99% and HR 104   Desaturated </= 88% no no no no no   Desaturated <= 3% points no no no no no   Got Tachycardic >/= 90/min no no yes yes yes   Symptoms at end of test Mild dyspnea No complaints No complaints No complaints Mild dyspnea   Miscellaneous comments x             SIT STAND TEST - goal 15 times   04/03/2024    O2 used ra   PRobe - finter or forehead forehead   Number sit and stand completed - goal 15 15 - had knee pain at 10   Time taken to complete 92 sec   Resting Pulse Ox/HR/Dyspnea  100% and 74/min and  dyspnea of 1/10    Peak measures 95 % and 103/min and dyspnea of 8/10   Final Pulse Ox/HR 100% and 75/min and dyspnea of 5/10   Desaturated </= 88% no   Desaturated <= 3% points yes   Got Tachycardic >/= 90/min yes   Miscellaneous comments Slow, dyspneic and had knee paind     CT Chest data from date: 05/29/24  - personally visualized and independently interpreted : yes - my findings are: agree  IMPRESSION: 1. No significant change in mild pulmonary fibrosis in an apical predominant pattern featuring irregular peripheral interstitial opacity septal thickening and ground-glass as well as small scattered areas of subpleural bronchiolectasis. Mild, lobular air trapping on expiratory phase imaging. Patient carries a biopsy diagnosis of NSIP. 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 Findings are suggestive of an alternative diagnosis (not UIP) per consensus guidelines: Diagnosis of Idiopathic Pulmonary Fibrosis: An Official ATS/ERS/JRS/ALAT Clinical Practice Guideline. Am JINNY [employer] Crit Care Med Vol 198, Iss 5, [id number], May 28 2017. 2. Coronary artery disease.   Aortic Atherosclerosis (ICD10-I70.0).     Electronically Signed   By: Marolyn [name] Jaksch M.D.   On: 06/01/2024 07:13  PFT     Latest Ref Rng & Units 07/02/2024    8:36 AM 12/10/2022    8:35 AM 08/17/2022    9:16 AM 01/25/2020    3:06 PM 05/11/2019    1:59 PM 05/18/2018   11:20 AM 03/20/2018    9:11 AM  PFT Results  FVC-Pre L 2.10  P 2.00  2.00  2.01  1.76  2.06  2.05   FVC-Predicted Pre % 78  P 72  72  71  61  71  71   FVC-Post L       2.16   FVC-Predicted Post %       75   Pre FEV1/FVC % % 87  P 83  87  83  82  89  88   Post FEV1/FCV % %       92   FEV1-Pre L 1.82  P 1.66  1.74  1.67  1.44  1.83  1.80   FEV1-Predicted Pre % 90  P 79  83  78  66  83  82   FEV1-Post L       1.99   DLCO uncorrected ml/min/mmHg 11.14  P 13.91  13.41  15.12  15.51     DLCO UNC% % 61  P 76  73  82  84      DLCO corrected ml/min/mmHg 11.14  P 13.91  13.41  15.12      DLCO COR %Predicted % 61  P 76  73  82      DLVA Predicted % 85  P 101  99  108  115       P Preliminary result       LAB RESULTS last 96 hours No results found.       has a past medical history of Anemia, Chronic major depressive disorder, DDD (degenerative disc disease), cervical, Depression, Dyspnea, Hypertension, Interstitial lung disease (HCC), Mixed hyperlipidemia, Pain in joints of both feet, Pneumonia (2016), Polyarthropathy, Stroke (HCC), Vision abnormalities, and Vitamin D deficiency.   reports that she quit smoking about 34 years ago. Her smoking use included cigarettes. She started smoking about 54 years ago. She has a 35 pack-year smoking history. She has never used smokeless tobacco.  Past Surgical History:  Procedure Laterality Date   CARPAL TUNNEL RELEASE     CESAREAN SECTION     x2   COLONOSCOPY     LUNG BIOPSY Left 02/08/2018   Procedure: LUNG BIOPSY;  Surgeon: Kerrin Elspeth BROCKS, MD;  Location: [facility] OR;  Service: Thoracic;  Laterality: Left;   right arm tendon surgery     TAYLOR BUNIONECTOMY     2 on one foot and 1 on the other foot   VIDEO ASSISTED THORACOSCOPY Left 02/08/2018   Procedure: VIDEO ASSISTED THORACOSCOPY;  Surgeon: Kerrin Elspeth BROCKS, MD;  Location: [facility] OR;  Service: Thoracic;  Laterality: Left;   VIDEO BRONCHOSCOPY N/A 02/08/2018   Procedure: VIDEO BRONCHOSCOPY;  Surgeon: Kerrin Elspeth BROCKS, MD;  Location: [facility] OR;  Service: Thoracic;  Laterality: N/A;    Allergies  Allergen Reactions   Codeine  Nausea Only    GI upset     Immunization History  Administered Date(s) Administered   Fluad Quad(high Dose 65+) 08/07/2019, 07/14/2021, 08/17/2022   INFLUENZA, HIGH DOSE SEASONAL PF 10/25/2017, 05/28/2020, 07/02/2024   Influenza, Mdck, Trivalent,PF 6+ MOS(egg free) 08/16/2023   Influenza-Unspecified 09/27/2013   PFIZER(Purple Top)SARS-COV-2 Vaccination 10/03/2019, 10/24/2019,  05/28/2020, 07/14/2021   Pneumococcal Conjugate-13 10/25/2017   Pneumococcal Polysaccharide-23 08/07/2019   Pneumococcal-Unspecified 08/07/2008   Zoster, Live 12/06/2013    Family History  Problem Relation Age of Onset   Emphysema Father    Asthma Father    Congestive Heart Failure Father    Stroke Father    Asthma Sister    COPD Sister  Alzheimer's disease Mother      Current Outpatient Medications:    albuterol  (VENTOLIN  HFA) 108 (90 Base) MCG/ACT inhaler, Inhale into the lungs., Disp: , Rfl:    ALPRAZolam  (XANAX ) 0.5 MG tablet, TAKE 1/2 TO 1 TABLET BY MOUTH DAILY AS NEEDED FOR ANXIETY. Limit use. Do not mix with tramadol ., Disp: , Rfl:    atorvastatin  (LIPITOR) 10 MG tablet, TAKE ONE TABLET BY MOUTH DAILY, Disp: , Rfl:    brimonidine (ALPHAGAN) 0.2 % ophthalmic solution, SMARTSIG:In Eye(s), Disp: , Rfl:    budesonide -formoterol  (SYMBICORT ) 80-4.5 MCG/ACT inhaler, Inhale 2 puffs into the lungs in the morning and at bedtime., Disp: 10.2 g, Rfl: 3   calcium  carbonate (TUMS EX) 750 MG chewable tablet, Chew 1 tablet by mouth daily as needed for heartburn., Disp: , Rfl:    celecoxib (CELEBREX) 200 MG capsule, Take 200 mg by mouth daily., Disp: , Rfl:    clopidogrel  (PLAVIX ) 75 MG tablet, Take by mouth., Disp: , Rfl:    DULoxetine  (CYMBALTA ) 60 MG capsule, Take 30 mg by mouth in the morning, at noon, and at bedtime., Disp: , Rfl:    Dupilumab  (DUPIXENT ) 300 MG/2ML SOAJ, Inject 300 mg into the skin every 14 (fourteen) days., Disp: 4 mL, Rfl: 2   latanoprost  (XALATAN ) 0.005 % ophthalmic solution, Place 1 drop into both eyes at bedtime., Disp: , Rfl:    losartan  (COZAAR ) 50 MG tablet, Take 50 mg by mouth daily. , Disp: , Rfl:    mupirocin ointment (BACTROBAN) 2 %, Apply 1 Application topically daily., Disp: , Rfl:    predniSONE  (DELTASONE ) 1 MG tablet, TAKE 4 TABLETS BY MOUTH DAILY WITH BREAKFAST, Disp: 360 tablet, Rfl: 1   aspirin  EC 81 MG tablet, Take 81 mg by mouth daily. (Patient not  taking: Reported on 07/02/2024), Disp: , Rfl:    dextromethorphan-guaiFENesin (MUCINEX DM) 30-600 MG per 12 hr tablet, Take 1 tablet by mouth 2 (two) times daily as needed (congestion).  (Patient not taking: Reported on 07/02/2024), Disp: , Rfl:    diclofenac  (VOLTAREN ) 50 MG EC tablet, Take 50 mg by mouth 2 (two) times daily. (Patient not taking: Reported on 07/02/2024), Disp: , Rfl:    dorzolamide-timolol (COSOPT) 22.3-6.8 MG/ML ophthalmic solution, , Disp: , Rfl:       Objective:   Vitals:   07/02/24 0911  BP: 132/70  Pulse: 67  Temp: 97.9 F (36.6 C)  SpO2: 97%  Weight: 164 lb 8 oz (74.6 kg)  Height: 5' 2 (1.575 m)    Estimated body mass index is 30.09 kg/m as calculated from the following:   Height as of this encounter: 5' 2 (1.575 m).   Weight as of this encounter: 164 lb 8 oz (74.6 kg).  @WEIGHTCHANGE @  [employer]   07/02/24 0911  Weight: 164 lb 8 oz (74.6 kg)     Physical Exam   General: No distress. Looks well O2 at rest: no Cane present: no Sitting in wheel chair: no Frail: no Obese: no Neuro: Alert and Oriented x 3. GCS 15. Speech normal Psych: Pleasant Resp:  Barrel Chest - no.  Wheeze - n, Crackles - on, No overt respiratory distress CVS: Normal heart sounds. Murmurs - o Ext: Stigmata of Connective Tissue Disease - no HEENT: Normal upper airway. PEERL +. No post nasal drip        Assessment/     Assessment & Plan ILD (interstitial lung disease) (HCC)  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HCC)  Current chronic use of  systemic steroids  Encounter for therapeutic drug monitoring  Flu vaccine need  Cough variant asthma vs UACS   Eosinophilia, unspecified type  House dust mite allergy   Elevated IgE level  Pain at injection site, initial encounter    PLAN Patient Instructions  ILD (interstitial lung disease) (HCC) 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HCC)   -PFT giving mixed picture but HRCT shows stability in ILD  x 18 months; good nes  Plan - For now continue prednisone  4 mg/day [if there is worsening then we will consider antifibrotic's] - Do spirometry and DLCO in  5 months    Wheezing Positive radioallergosorbent test (RAST) Elevated IgE level Eosinophilia, unspecified type House dust mite allergy   -In 2017 significant environmental allergies including various environmental agents such as pollen, dust mite and dog.  Also reflected with elevated eosinophils and elevated IgE.   - started dupixent  Sept 2025; seems already sneezing better  BUT injection   Plan -Continue Symbicort  for the moment - Continue dupixent ; wil need to give more time for benefit evalaution   Pain at injection site  = - think this is because you iare doing dupixent  IM  Plan  - meet Cleora Puls for re-education  - have your nurse daughter in law supervise 1 injection at home  - consider abdominal site  DOE (dyspnea on exertion)   - BNP nromal July 2025 so this is not from heart issues  - Suspect due to weight, ILD and physical deconditioning  -Plan -rehab when you are ready  Flu Vaccine  Plan  - high dose flu shot 07/02/2024    Followup  - Dr. Geronimo or nurse practitioner in  5 monnhs; 15 min visit if Dr Geronimo but after PFT          FOLLOWUP    Return for  - Dr. Geronimo or nurse practitioner in  5 monnhs; 15 min visit if Dr Geronimo but after PFT.    SIGNATURE    Dr. Dorethia Geronimo, M.D., F.C.C.P,  Pulmonary and Critical Care Medicine Staff Physician, [facility] Director - Interstitial Lung Disease  Program  Pulmonary Fibrosis [facility] [place], [place], [address]  Pager: [phone], If no answer or between  15:00h - 7:00h: call [phone] Telephone: [phone]  9:47 AM 07/02/2024

## 2024-07-02 NOTE — Progress Notes (Signed)
 Spirometry and diffusion capacity performed today.

## 2024-07-02 NOTE — Patient Instructions (Addendum)
 ILD (interstitial lung disease) (HCC) 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HCC)   -PFT giving mixed picture but HRCT shows stability in ILD x 18 months; good nes  Plan - For now continue prednisone  4 mg/day [if there is worsening then we will consider antifibrotic's] - Do spirometry and DLCO in  5 months    Wheezing Positive radioallergosorbent test (RAST) Elevated IgE level Eosinophilia, unspecified type House dust mite allergy   -In 2017 significant environmental allergies including various environmental agents such as pollen, dust mite and dog.  Also reflected with elevated eosinophils and elevated IgE.   - started dupixent  Sept 2025; seems already sneezing better  BUT injection   Plan -Continue Symbicort  for the moment - Continue dupixent ; wil need to give more time for benefit evalaution   Pain at injection site  = - think this is because you iare doing dupixent  IM  Plan  - meet Cleora Puls for re-education  - have your nurse daughter in law supervise 1 injection at home  - consider abdominal site  DOE (dyspnea on exertion)   - BNP nromal July 2025 so this is not from heart issues  - Suspect due to weight, ILD and physical deconditioning  -Plan -rehab when you are ready  Flu Vaccine  Plan  - high dose flu shot 07/02/2024    Followup  - Dr. Geronimo or nurse practitioner in  5 monnhs; 15 min visit if Dr Geronimo but after PFT

## 2024-07-02 NOTE — Patient Instructions (Signed)
 Spirometry and diffusion capacity performed today.

## 2024-07-02 NOTE — Progress Notes (Signed)
 Counseled on Dupixent  injection technique at request of Dr. Geronimo.   Patient reports a lot of drug was missed with her first Dupixent  injection at home; reports she had to get used to the appropriate technique. Second injection at home was very painful, but she is confident she injected all of the drug. She denied persistent pain.   She is using the anterior of her mid-thigh as injection site.   We discussed injection technique, including injecting at a 90 degree angle into fatty tissue on the thigh or stomach area. Keep holding pen against the skin until she hears the second click to signal that injection is completed. Discussed choosing an area that has more fatty tissue within the recommended injection sites. Injecting an area with less fatty tissue may lead to more painful injection. She plans to use her outer thigh or her lower abdomen for next injection. We also discussed pinching the skin around the injection site before and during the injection to ensure appropriate subcutaneous administration.  Provided handouts from Dupixent  manufacturer with visuals of appropriate injection technique. Patient verbalizes understanding of above and denies further questions/concerns.   Aleck Puls, PharmD, BCPS, CPP Clinical Pharmacist  [facility]

## 2024-07-03 ENCOUNTER — Encounter

## 2024-07-03 ENCOUNTER — Ambulatory Visit: Admitting: Internal Medicine

## 2024-07-04 ENCOUNTER — Other Ambulatory Visit: Payer: Self-pay

## 2024-07-25 ENCOUNTER — Other Ambulatory Visit (HOSPITAL_COMMUNITY): Payer: Self-pay

## 2024-07-27 ENCOUNTER — Other Ambulatory Visit (HOSPITAL_COMMUNITY): Payer: Self-pay

## 2024-07-27 ENCOUNTER — Other Ambulatory Visit: Payer: Self-pay

## 2024-07-27 NOTE — Progress Notes (Signed)
 Specialty Pharmacy Refill Coordination Note  Sharon [name] is a 73 y.o. female contacted today regarding refills of specialty medication(s) Dupilumab  (Dupixent )   Patient requested Delivery   Delivery date: 07/31/24   Verified address: [address], [place], [address]   Medication will be filled on: 07/30/24

## 2024-07-30 ENCOUNTER — Other Ambulatory Visit: Payer: Self-pay

## 2024-08-20 ENCOUNTER — Other Ambulatory Visit: Payer: Self-pay

## 2024-08-22 ENCOUNTER — Other Ambulatory Visit: Payer: Self-pay

## 2024-08-22 ENCOUNTER — Other Ambulatory Visit (HOSPITAL_COMMUNITY): Payer: Self-pay

## 2024-08-22 NOTE — Progress Notes (Signed)
 Specialty Pharmacy Refill Coordination Note  Spoke with [name],[name] (Husband)  Sharon [name] is a 73 y.o. female contacted today regarding refills of specialty medication(s) Dupilumab  (Dupixent )  Doses on hand: 0  Injection date: 09/04/24   Patient requested: Delivery   Delivery date: 08/30/24   Verified address: [address] [name] [address]  Medication will be filled on 08/29/24

## 2024-08-29 ENCOUNTER — Other Ambulatory Visit: Payer: Self-pay

## 2024-09-18 ENCOUNTER — Other Ambulatory Visit: Payer: Self-pay

## 2024-09-18 ENCOUNTER — Other Ambulatory Visit: Payer: Self-pay | Admitting: Internal Medicine

## 2024-09-18 DIAGNOSIS — J45991 Cough variant asthma: Secondary | ICD-10-CM

## 2024-09-18 MED ORDER — DUPIXENT 300 MG/2ML ~~LOC~~ SOAJ
300.0000 mg | SUBCUTANEOUS | 2 refills | Status: DC
  Filled 2024-09-18 – 2024-09-19 (×2): qty 4, 28d supply, fill #0

## 2024-09-18 NOTE — Telephone Encounter (Signed)
 Refill sent for DUPIXENT  to [facility]: [phone]   Dose: 300mg  [place] every 14 days  Last OV: 07/02/24 Provider: Dr. Geronimo  Next OV: due March 2026  Aleck Puls, PharmD, BCPS Clinical Pharmacist  [facility]

## 2024-09-19 ENCOUNTER — Other Ambulatory Visit: Payer: Self-pay

## 2024-09-21 ENCOUNTER — Other Ambulatory Visit (HOSPITAL_COMMUNITY): Payer: Self-pay

## 2024-09-21 ENCOUNTER — Other Ambulatory Visit: Payer: Self-pay

## 2024-09-21 NOTE — Progress Notes (Signed)
 Specialty Pharmacy Refill Coordination Note  Spoke with Schiefelbein,[name] (Husband)  Sharon [name] is a 73 y.o. female contacted today regarding refills of specialty medication(s) Dupilumab  (Dupixent )  Doses on hand: 1 for 12/30  Next inj: 10/09/24   Patient requested: Delivery   Delivery date: 10/02/24   Verified address: [address] [place] [address]  Medication will be filled on 10/01/24

## 2024-10-01 ENCOUNTER — Other Ambulatory Visit: Payer: Self-pay

## 2024-10-01 NOTE — Progress Notes (Signed)
 MyChart message sent to patient regarding M3P.

## 2024-10-02 ENCOUNTER — Other Ambulatory Visit: Payer: Self-pay

## 2024-10-03 ENCOUNTER — Other Ambulatory Visit: Payer: Self-pay

## 2024-10-08 ENCOUNTER — Other Ambulatory Visit: Payer: Self-pay

## 2024-10-09 ENCOUNTER — Other Ambulatory Visit: Payer: Self-pay

## 2024-10-09 ENCOUNTER — Other Ambulatory Visit (HOSPITAL_COMMUNITY): Payer: Self-pay

## 2024-10-25 NOTE — Addendum Note (Signed)
 Addended by: Thandiwe Siragusa L on: 10/25/2024 03:46 PM   Modules accepted: Orders

## 2024-10-30 ENCOUNTER — Other Ambulatory Visit: Payer: Self-pay

## 2024-10-31 ENCOUNTER — Other Ambulatory Visit: Payer: Self-pay

## 2024-10-31 ENCOUNTER — Ambulatory Visit

## 2024-10-31 DIAGNOSIS — J45991 Cough variant asthma: Secondary | ICD-10-CM

## 2024-10-31 DIAGNOSIS — Z79899 Other long term (current) drug therapy: Secondary | ICD-10-CM

## 2024-10-31 MED ORDER — DUPIXENT 300 MG/2ML ~~LOC~~ SOAJ
300.0000 mg | SUBCUTANEOUS | 1 refills | Status: AC
  Filled 2024-10-31: qty 4, 28d supply, fill #0

## 2024-10-31 NOTE — Progress Notes (Signed)
 [facility] - Continuation of Therapy with Biologic  Referring Provider: Dorethia Cave (received verbal authorization for referral)  Virtual Visit via Telephone Note  I connected with Sharon [name] on 10/31/24 at 11:20 AM EST by telephone and verified that I am speaking with the correct person using two identifiers.  Location: Patient: home Provider: office   I discussed the limitations, risks, security and privacy concerns of performing an evaluation and management service by telephone and the availability of in person appointments. I also discussed with the patient that there may be a patient responsible charge related to this service. The patient expressed understanding and agreed to proceed.  HPI: Sharon [name] is a 74 y.o. female who presents to the pharmacotherapy clinic via telephone for continuation of therapy with Dupixent . Last OV with Dr. Cave was on 07/02/24.   Indication: Interstitial Lung Disease with asthma/allergy  phenotype Dosing: 300mg  every 14 days  Patient's current respiratory regimen: Symbicort   Patient Active Problem List   Diagnosis Date Noted   Gastroesophageal reflux disease 03/27/2019   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HCC) 03/27/2019   Personal history of rheumatoid arthritis 03/27/2019   ILD (interstitial lung disease) (HCC) 02/08/2018   Cough variant asthma vs UACS  02/03/2016   Pulmonary nodules 02/03/2016    Patient's Medications  New Prescriptions   No medications on file  Previous Medications   ALBUTEROL  (VENTOLIN  HFA) 108 (90 BASE) MCG/ACT INHALER    Inhale into the lungs.   ALPRAZOLAM  (XANAX ) 0.5 MG TABLET    TAKE 1/2 TO 1 TABLET BY MOUTH DAILY AS NEEDED FOR ANXIETY. Limit use. Do not mix with tramadol .   ASPIRIN  EC 81 MG TABLET    Take 81 mg by mouth daily.   ATORVASTATIN  (LIPITOR) 10 MG TABLET    TAKE ONE TABLET BY MOUTH DAILY   BRIMONIDINE (ALPHAGAN) 0.2 % OPHTHALMIC SOLUTION    SMARTSIG:In  Eye(s)   BUDESONIDE -FORMOTEROL  (SYMBICORT ) 80-4.5 MCG/ACT INHALER    Inhale 2 puffs into the lungs in the morning and at bedtime.   CALCIUM  CARBONATE (TUMS EX) 750 MG CHEWABLE TABLET    Chew 1 tablet by mouth daily as needed for heartburn.   CELECOXIB (CELEBREX) 200 MG CAPSULE    Take 200 mg by mouth daily.   CLOPIDOGREL  (PLAVIX ) 75 MG TABLET    Take by mouth.   DEXTROMETHORPHAN-GUAIFENESIN (MUCINEX DM) 30-600 MG PER 12 HR TABLET    Take 1 tablet by mouth 2 (two) times daily as needed (congestion).    DICLOFENAC  (VOLTAREN ) 50 MG EC TABLET    Take 50 mg by mouth 2 (two) times daily.   DORZOLAMIDE-TIMOLOL (COSOPT) 22.3-6.8 MG/ML OPHTHALMIC SOLUTION       DULOXETINE  (CYMBALTA ) 60 MG CAPSULE    Take 30 mg by mouth in the morning, at noon, and at bedtime.   LATANOPROST  (XALATAN ) 0.005 % OPHTHALMIC SOLUTION    Place 1 drop into both eyes at bedtime.   LOSARTAN  (COZAAR ) 50 MG TABLET    Take 50 mg by mouth daily.    MUPIROCIN OINTMENT (BACTROBAN) 2 %    Apply 1 Application topically daily.   PREDNISONE  (DELTASONE ) 1 MG TABLET    TAKE 4 TABLETS BY MOUTH DAILY WITH BREAKFAST  Modified Medications   Modified Medication Previous Medication   DUPILUMAB  (DUPIXENT ) 300 MG/2ML SOAJ Dupilumab  (DUPIXENT ) 300 MG/2ML SOAJ      Inject 300 mg into the skin every 14 (fourteen) days.    Inject 300 mg into the skin every 14 (  fourteen) days.  Discontinued Medications   No medications on file    Allergies: Allergies[1]  Past Medical History: Past Medical History:  Diagnosis Date   Anemia    'years ago   Chronic major depressive disorder    DDD (degenerative disc disease), cervical    Depression    Dyspnea    Hypertension    Interstitial lung disease (HCC)    Mixed hyperlipidemia    Pain in joints of both feet    Pneumonia 2016   Polyarthropathy    Stroke [facility])    TIA 11/11/17   Vision abnormalities    catracts left eye   Vitamin D deficiency     Social History: Social History   Socioeconomic  History   Marital status: Married    Spouse name: Not on file   Number of children: Not on file   Years of education: Not on file   Highest education level: Not on file  Occupational History   Occupation: retired  Tobacco Use   Smoking status: Former    Current packs/day: 0.00    Average packs/day: 1.8 packs/day for 20.0 years (35.0 ttl pk-yrs)    Types: Cigarettes    Start date: 65    Quit date: 1991    Years since quitting: 35.1   Smokeless tobacco: Never  Vaping Use   Vaping status: Never Used  Substance and Sexual Activity   Alcohol use: Yes    Alcohol/week: 1.0 standard drink of alcohol    Types: 1 Standard drinks or equivalent per week    Comment: once a month   Drug use: Not Currently    Frequency: 7.0 times per week    Comment: 10 yrs. to current   Sexual activity: Not on file  Other Topics Concern   Not on file  Social History Narrative   Lives with husband.   Homemaker.   2 children   Social Drivers of Health   Tobacco Use: Medium Risk (09/25/2024)   Received from [facility]   Patient History    Smoking Tobacco Use: Former    Smokeless Tobacco Use: Never    Passive Exposure: Past  Physicist, Medical Strain: Low Risk (12/26/2023)   Received from [facility]   Overall Financial Resource Strain (CARDIA)    Difficulty of Paying Living Expenses: Not very hard  Food Insecurity: No Food Insecurity (12/26/2023)   Received from [facility]   Epic    Within the past 12 months, you worried that your food would run out before you got the money to buy more.: Never true    Within the past 12 months, the food you bought just didn't last and you didn't have money to get more.: Never true  Transportation Needs: No Transportation Needs (12/26/2023)   Received from [facility] - Transportation    Lack of Transportation (Medical): No    Lack of Transportation (Non-Medical): No  Physical Activity: Unknown (12/26/2023)   Received from [facility]    Exercise Vital Sign    On average, how many days per week do you engage in moderate to strenuous exercise (like a brisk walk)?: 0 days    Minutes of Exercise per Session: Not on file  Stress: No Stress Concern Present (12/26/2023)   Received from [facility] - Occupational Stress Questionnaire    Feeling of Stress : Not at all  Social Connections: Socially Integrated (12/26/2023)   Received from [facility]  Social Network    How would you rate your social network (family, work, friends)?: Good participation with social networks  Depression (PHQ2-9): Not on file  Alcohol Screen: Not on file  Housing: Low Risk (12/26/2023)   Received from [facility]    In the last 12 months, was there a time when you were not able to pay the mortgage or rent on time?: No    In the past 12 months, how many times have you moved where you were living?: 1    At any time in the past 12 months, were you homeless or living in a shelter (including now)?: No  Utilities: Not At Risk (12/26/2023)   Received from [facility] Utilities    Threatened with loss of utilities: No  Health Literacy: Not on file      Assessment/Plan: 1. Patient prescribed Dupixent  for interstitial lung disease with asthma/allergy  phenotype. Reviewed the medication with the patient, including the following:   Goals of therapy: Mechanism: monoclonal antibody used for the treatment of asthma or asthma/COPD overlap Reviewed that Dupixent  is add-on medication and patient must continue maintenance inhaler regimen. Response to therapy: may take 3-4 months to determine efficacy.  Side effects: injection site reaction, antibody development, arthralgia, ocular effects  Dose: Dupixent  300mg  once every 2 weeks  Administration/Storage:  Administer as a SubQ injection and rotate sites.  Keep medication in refrigerator, do not freeze.  Allow the medication to reach room temp prior  to administration (45 mins for 300 mg syringe or 30 min for 200 mg syringe). Do not shake.   Plan:  - Patient will continue injections at home every 2 weeks.  - Rx will be triaged to [facility] for delivery to patient home pending results of additional benefits investigation.   I discussed the assessment and treatment plan with the patient. The patient was provided an opportunity to ask questions and all were answered. The patient agreed with the plan and demonstrated an understanding of the instructions.   The patient was advised to call back or seek an in-person evaluation if the symptoms worsen or if the condition fails to improve as anticipated.  I provided 15 minutes of non-face-to-face time during this encounter.  Patient verbalizes understanding and agreement with plan.   Delon Brow, PharmD, CSP, AAHIVP, CPP Clinical Pharmacist Practitioner - Medication Therapy Disease Management/Specialty Pharmacy Services 10/31/2024, 12:25 PM     [1]  Allergies Allergen Reactions   Codeine  Nausea Only    GI upset

## 2024-11-02 ENCOUNTER — Other Ambulatory Visit: Payer: Self-pay
